# Patient Record
Sex: Male | Born: 1935 | Race: Black or African American | Hispanic: No | Smoking: Current every day smoker
Health system: Southern US, Community
[De-identification: ages and names within clinical notes are randomized; demographics above are authoritative.]

## PROBLEM LIST (undated history)

## (undated) DIAGNOSIS — R569 Unspecified convulsions: Secondary | ICD-10-CM

## (undated) DIAGNOSIS — F209 Schizophrenia, unspecified: Secondary | ICD-10-CM

## (undated) DIAGNOSIS — G459 Transient cerebral ischemic attack, unspecified: Secondary | ICD-10-CM

## (undated) DIAGNOSIS — I10 Essential (primary) hypertension: Secondary | ICD-10-CM

---

## 2014-05-17 ENCOUNTER — Emergency Department: Payer: Self-pay | Admitting: Emergency Medicine

## 2014-05-17 DIAGNOSIS — R569 Unspecified convulsions: Secondary | ICD-10-CM | POA: Diagnosis not present

## 2014-05-17 DIAGNOSIS — R197 Diarrhea, unspecified: Secondary | ICD-10-CM | POA: Diagnosis not present

## 2014-05-17 DIAGNOSIS — Z79899 Other long term (current) drug therapy: Secondary | ICD-10-CM | POA: Diagnosis not present

## 2014-05-17 DIAGNOSIS — R32 Unspecified urinary incontinence: Secondary | ICD-10-CM | POA: Diagnosis not present

## 2014-05-17 DIAGNOSIS — G40909 Epilepsy, unspecified, not intractable, without status epilepticus: Secondary | ICD-10-CM | POA: Diagnosis not present

## 2014-05-17 DIAGNOSIS — R7309 Other abnormal glucose: Secondary | ICD-10-CM | POA: Diagnosis not present

## 2014-07-25 ENCOUNTER — Encounter: Payer: Self-pay | Admitting: Emergency Medicine

## 2014-07-25 ENCOUNTER — Emergency Department: Payer: Medicare Other

## 2014-07-25 ENCOUNTER — Emergency Department
Admission: EM | Admit: 2014-07-25 | Discharge: 2014-07-25 | Disposition: A | Discharge status: Home / Self Care | Payer: Medicare Other | Attending: Emergency Medicine | Admitting: Emergency Medicine

## 2014-07-25 DIAGNOSIS — S199XXA Unspecified injury of neck, initial encounter: Secondary | ICD-10-CM | POA: Insufficient documentation

## 2014-07-25 DIAGNOSIS — Y9389 Activity, other specified: Secondary | ICD-10-CM | POA: Insufficient documentation

## 2014-07-25 DIAGNOSIS — R51 Headache: Secondary | ICD-10-CM | POA: Diagnosis not present

## 2014-07-25 DIAGNOSIS — S0003XA Contusion of scalp, initial encounter: Secondary | ICD-10-CM | POA: Insufficient documentation

## 2014-07-25 DIAGNOSIS — W1839XA Other fall on same level, initial encounter: Secondary | ICD-10-CM | POA: Diagnosis not present

## 2014-07-25 DIAGNOSIS — Z720 Tobacco use: Secondary | ICD-10-CM | POA: Insufficient documentation

## 2014-07-25 DIAGNOSIS — Y998 Other external cause status: Secondary | ICD-10-CM | POA: Insufficient documentation

## 2014-07-25 DIAGNOSIS — G40309 Generalized idiopathic epilepsy and epileptic syndromes, not intractable, without status epilepticus: Secondary | ICD-10-CM

## 2014-07-25 DIAGNOSIS — Y9289 Other specified places as the place of occurrence of the external cause: Secondary | ICD-10-CM | POA: Insufficient documentation

## 2014-07-25 DIAGNOSIS — M2578 Osteophyte, vertebrae: Secondary | ICD-10-CM | POA: Diagnosis not present

## 2014-07-25 DIAGNOSIS — S0093XA Contusion of unspecified part of head, initial encounter: Secondary | ICD-10-CM | POA: Diagnosis not present

## 2014-07-25 DIAGNOSIS — G40909 Epilepsy, unspecified, not intractable, without status epilepticus: Secondary | ICD-10-CM | POA: Diagnosis not present

## 2014-07-25 DIAGNOSIS — M542 Cervicalgia: Secondary | ICD-10-CM | POA: Diagnosis not present

## 2014-07-25 DIAGNOSIS — Z7409 Other reduced mobility: Secondary | ICD-10-CM | POA: Diagnosis not present

## 2014-07-25 DIAGNOSIS — R569 Unspecified convulsions: Secondary | ICD-10-CM | POA: Diagnosis not present

## 2014-07-25 DIAGNOSIS — F918 Other conduct disorders: Secondary | ICD-10-CM | POA: Diagnosis not present

## 2014-07-25 DIAGNOSIS — W19XXXA Unspecified fall, initial encounter: Secondary | ICD-10-CM | POA: Diagnosis not present

## 2014-07-25 HISTORY — DX: Schizophrenia, unspecified: F20.9

## 2014-07-25 HISTORY — DX: Unspecified convulsions: R56.9

## 2014-07-25 HISTORY — DX: Transient cerebral ischemic attack, unspecified: G45.9

## 2014-07-25 LAB — CBC
HEMATOCRIT: 42.7 % (ref 40.0–52.0)
HEMOGLOBIN: 13.9 g/dL (ref 13.0–18.0)
MCH: 26.6 pg (ref 26.0–34.0)
MCHC: 32.4 g/dL (ref 32.0–36.0)
MCV: 81.9 fL (ref 80.0–100.0)
PLATELETS: 174 10*3/uL (ref 150–440)
RBC: 5.22 MIL/uL (ref 4.40–5.90)
RDW: 14.6 % — ABNORMAL HIGH (ref 11.5–14.5)
WBC: 5.9 10*3/uL (ref 3.8–10.6)

## 2014-07-25 LAB — BASIC METABOLIC PANEL
Anion gap: 5 (ref 5–15)
BUN: 14 mg/dL (ref 6–20)
CO2: 33 mmol/L — ABNORMAL HIGH (ref 22–32)
CREATININE: 0.88 mg/dL (ref 0.61–1.24)
Calcium: 9.6 mg/dL (ref 8.9–10.3)
Chloride: 101 mmol/L (ref 101–111)
GLUCOSE: 89 mg/dL (ref 65–99)
POTASSIUM: 3.6 mmol/L (ref 3.5–5.1)
Sodium: 139 mmol/L (ref 135–145)

## 2014-07-25 LAB — GLUCOSE, CAPILLARY: GLUCOSE-CAPILLARY: 95 mg/dL (ref 65–99)

## 2014-07-25 MED ORDER — LEVETIRACETAM 500 MG PO TABS
ORAL_TABLET | ORAL | Status: AC
  Administered 2014-07-25: 1000 mg via ORAL
  Filled 2014-07-25: qty 2

## 2014-07-25 MED ORDER — LEVETIRACETAM 500 MG PO TABS
1000.0000 mg | ORAL_TABLET | Freq: Once | ORAL | Status: AC
  Administered 2014-07-25: 1000 mg via ORAL

## 2014-07-25 MED ORDER — LEVETIRACETAM 500 MG PO TABS
ORAL_TABLET | ORAL | Status: AC
  Filled 2014-07-25: qty 2

## 2014-07-25 NOTE — ED Notes (Signed)
Pt transported CT

## 2014-07-25 NOTE — Discharge Instructions (Signed)
Epilepsy  Please increase your Keppra dose to 1000 mg twice daily. [facility] as possible.  Return to the ER right away should you have a recurrent seizure, fever, chest pain, pass out, have trouble with speaking, weakness in one arm or leg or face, or other new concerns arise.  Epilepsy is a disorder in which a person has repeated seizures over time. A seizure is a release of abnormal electrical activity in the brain. Seizures can cause a change in attention, behavior, or the ability to remain awake and alert (altered mental status). Seizures often involve uncontrollable shaking (convulsions).  Most people with epilepsy lead normal lives. However, people with epilepsy are at an increased risk of falls, accidents, and injuries. Therefore, it is important to begin treatment right away. CAUSES  Epilepsy has many possible causes. Anything that disturbs the normal pattern of brain cell activity can lead to seizures. This may include:   Head injury.  Birth trauma.  High fever as a child.  Stroke.  Bleeding into or around the brain.  Certain drugs.  Prolonged low oxygen, such as what occurs after CPR efforts.  Abnormal brain development.  Certain illnesses, such as meningitis, encephalitis (brain infection), malaria, and other infections.  An imbalance of nerve signaling chemicals (neurotransmitters).  SIGNS AND SYMPTOMS  The symptoms of a seizure can vary greatly from one person to another. Right before a seizure, you may have a warning (aura) that a seizure is about to occur. An aura may include the following symptoms:  Fear or anxiety.  Nausea.  Feeling like the room is spinning (vertigo).  Vision changes, such as seeing flashing lights or spots. Common symptoms during a seizure include:  Abnormal sensations, such as an abnormal smell or a bitter taste in the mouth.   Sudden, general body stiffness.   Convulsions that involve  rhythmic jerking of the face, arm, or leg on one or both sides.   Sudden change in consciousness.   Appearing to be awake but not responding.   Appearing to be asleep but cannot be awakened.   Grimacing, chewing, lip smacking, drooling, tongue biting, or loss of bowel or bladder control. After a seizure, you may feel sleepy for a while. DIAGNOSIS  Your health care provider will ask about your symptoms and take a medical history. Descriptions from any witnesses to your seizures will be very helpful in the diagnosis. A physical exam, including a detailed neurological exam, is necessary. Various tests may be done, such as:   An electroencephalogram (EEG). This is a painless test of your brain waves. In this test, a diagram is created of your brain waves. These diagrams can be interpreted by a specialist.  An MRI of the brain.   A CT scan of the brain.   A spinal tap (lumbar puncture, LP).  Blood tests to check for signs of infection or abnormal blood chemistry. TREATMENT  There is no cure for epilepsy, but it is generally treatable. Once epilepsy is diagnosed, it is important to begin treatment as soon as possible. For most people with epilepsy, seizures can be controlled with medicines. The following may also be used:  A pacemaker for the brain (vagus nerve stimulator) can be used for people with seizures that are not well controlled by medicine.  Surgery on the brain. For some people, epilepsy eventually goes away. HOME CARE INSTRUCTIONS   Follow your health care provider's recommendations on driving and safety in  normal activities.  Get enough rest. Lack of sleep can cause seizures.  Only take over-the-counter or prescription medicines as directed by your health care provider. Take any prescribed medicine exactly as directed.  Avoid any known triggers of your seizures.  Keep a seizure diary. Record what you recall about any seizure, especially any possible trigger.    Make sure the people you live and work with know that you are prone to seizures. They should receive instructions on how to help you. In general, a witness to a seizure should:   Cushion your head and body.   Turn you on your side.   Avoid unnecessarily restraining you.   Not place anything inside your mouth.   Call for emergency medical help if there is any question about what has occurred.   Follow up with your health care provider as directed. You may need regular blood tests to monitor the levels of your medicine.  SEEK MEDICAL CARE IF:   You develop signs of infection or other illness. This might increase the risk of a seizure.   You seem to be having more frequent seizures.   Your seizure pattern is changing.  SEEK IMMEDIATE MEDICAL CARE IF:   You have a seizure that does not stop after a few moments.   You have a seizure that causes any difficulty in breathing.   You have a seizure that results in a very severe headache.   You have a seizure that leaves you with the inability to speak or use a part of your body.  Document Released: 02/28/2005 Document Revised: 12/19/2012 Document Reviewed: 10/10/2012 [facility] Patient Information 2015 [place], [place]. This information is not intended to replace advice given to you by your health care provider. Make sure you discuss any questions you have with your health care provider.

## 2014-07-25 NOTE — ED Provider Notes (Signed)
[facility] Emergency Department Provider Note  ____________________________________________  Time seen: Approximately 12:02 PM  I have reviewed the triage vital signs and the nursing notes.   HISTORY  Chief Complaint Seizures    HPI Timothy [name] is a 81 y.o. male who had a seizure today. The patient reports that he got up from bed, then recalls being awoken and told that he had a seizure.  EMS described that witnesses on scene describe a generalized seizure. The patient was initially confused with EMS so c-collar was applied as her unable to clear cervical spine. They did note that he has swelling over the right frontal area of his face which is new.  Blood sugar normal at EMS.  Patient denies any concerns at present. He denies headache. He stated his neck is slightly achy, but otherwise feels well. He states he is on seizure medication.  Patient notes ache in the neck. There is no numbness mild. It is noted as the upper portion of the neck. Timing is sudden onset after seizure. Quality described as achy.   Past Medical History  Diagnosis Date  . Seizures   . TIA (transient ischemic attack)   . Schizophrenia     There are no active problems to display for this patient.   History reviewed. No pertinent past surgical history.  No current outpatient prescriptions on file.  Allergies Review of patient's allergies indicates no known allergies.  History reviewed. No pertinent family history.  Social History History  Substance Use Topics  . Smoking status: Current Every Day Smoker -- 8.00 packs/day for 25 years    Types: Cigarettes  . Smokeless tobacco: Not on file  . Alcohol Use: No    Review of Systems Constitutional: No fever/chills Eyes: No visual changes. ENT: No sore throat. Achy neck. Cardiovascular: Denies chest pain. Respiratory: Denies shortness of breath. Gastrointestinal: No abdominal pain.  No nausea, no vomiting.  No  diarrhea.  No constipation. Genitourinary: Negative for dysuria. Musculoskeletal: Negative for back pain. Skin: Negative for rash. Neurological: Negative for headaches, focal weakness or numbness.  10-point ROS otherwise negative.  ____________________________________________   PHYSICAL EXAM:  VITAL SIGNS: ED Triage Vitals  Enc Vitals Group     BP 07/25/14 1132 162/100 mmHg     Pulse Rate 07/25/14 1132 75     Resp --      Temp 07/25/14 1132 98.2 F (36.8 C)     Temp Source 07/25/14 1132 Oral     SpO2 07/25/14 1132 97 %     Weight 07/25/14 1132 160 lb (72.576 kg)     Height 07/25/14 1132 5\' 11"  (1.803 m)     Head Cir --      Peak Flow --      Pain Score 07/25/14 1139 5     Pain Loc --      Pain Edu? --      Excl. in GC? --     Constitutional: Alert and oriented to self and place, but not to year.. Well appearing and in no acute distress. Eyes: Conjunctivae are normal. PERRL. EOMI. Head: Atraumatic except for an approximately 4 cm x 3 cm area of mild hematoma without skin lesion over the right frontal scalp. There is no ocular involvement. Nose: No congestion/rhinnorhea. Mouth/Throat: Mucous membranes are moist.  Oropharynx non-erythematous. Neck: No stridor.  Cervical tenderness to palpation. She is in a c-collar and this is maintained in the ER. Cardiovascular: Normal rate, regular rhythm. Grossly normal heart sounds.  Good peripheral circulation. Respiratory: Normal respiratory effort.  No retractions. Lungs CTAB. Gastrointestinal: Soft and nontender. No distention. No abdominal bruits. No CVA tenderness. Musculoskeletal: No lower extremity tenderness nor edema.  No joint effusions. Neurologic:  Normal speech and language. No gross focal neurologic deficits are appreciated. Speech is normal. No gait instability. Skin:  Skin is warm, dry and intact. No rash noted. Psychiatric: Mood is normal, however affect is quite flat. Speech and behavior are  normal  ____________________________________________   LABS (all labs ordered are listed, but only abnormal results are displayed)  Labs Reviewed  CBC - Abnormal; Notable for the following:    RDW 14.6 (*)    All other components within normal limits  BASIC METABOLIC PANEL - Abnormal; Notable for the following:    CO2 33 (*)    All other components within normal limits  GLUCOSE, CAPILLARY  CBG MONITORING, ED   ____________________________________________  EKG  Normal sinus rhythm with sinus arrhythmia. There is no acute ST abnormality. Ventricular rate 66 PR interval 200 QRS 110 QTc 410 ____________________________________________  RADIOLOGY  IMPRESSION: Head CT: Normal for age. No acute or traumatic finding other than a right frontal scalp hematoma.  Cervical spine CT: No acute or traumatic finding. Prominent bridging osteophytes consistent with diffuse idiopathic skeletal hyperostosis. ____________________________________________   PROCEDURES  Procedure(s) performed: None  Critical Care performed: No  ____________________________________________   INITIAL IMPRESSION / ASSESSMENT AND PLAN / ED COURSE  Pertinent labs & imaging results that were available during my care of the patient were reviewed by me and considered in my medical decision making (see chart for details).  Consistent with seizure. Patient has a known seizure disorder. He is currently awake and alert, and appears to be back to his baseline. The patient is currently residing in a group home. His Mars present and shows that he is compliant with his medications. Patient notably takes Keppra.  Because the patient does have evidence of injury to the frontal scalp and achiness in the neck, we will obtain CT imaging the head and cervical spine. In addition we will send basic labs including basic metabolic panel and CBC.  Patient labs reviewed. Only abnormality appears to be a CO 33.  CT head is  unremarkable for intracranial hemorrhage, noted frontal hematoma. C-spine no acute trauma.  Patient vital signs stable. He is awake and alert in no distress at this time.  Patient medication list includes Keppra 750 mg twice a day. Given an additional 1 g here. Paged neurology to discuss is a patient has had breakthrough seizure.  ----------------------------------------- 3:10 PM on 07/25/2014 ----------------------------------------- Patient is awake and alert. C-spine is cleared clinically at this time. Discussed with the patient seizure, and recommendations. He is comfortable with the plan. He will follow up with neurology as an outpatient, discussed with Dr. Katrinka BlazingSmith who recommends increasing patient's Keppra level to 1000 mg twice a day, who also states it is not necessary to repeat a level until follow-up with neurology in about 1 month.  I discussed Dr. Michaelle CopasSmith's recommendations. [facility]  Return precautions discussed.  ____________________________________________   FINAL CLINICAL IMPRESSION(S) / ED DIAGNOSES  Final diagnoses:  None   seizure, initial, acute history of seizure disorder    Sharyn CreamerMark Makaylynn Bonillas, MD 07/25/14 1600

## 2014-07-25 NOTE — ED Notes (Signed)
Seizure pads applied to side rails

## 2014-11-20 ENCOUNTER — Emergency Department
Admission: EM | Admit: 2014-11-20 | Discharge: 2014-11-21 | Disposition: A | Discharge status: Home / Self Care | Payer: Medicare Other | Attending: Emergency Medicine | Admitting: Emergency Medicine

## 2014-11-20 DIAGNOSIS — Z720 Tobacco use: Secondary | ICD-10-CM | POA: Diagnosis not present

## 2014-11-20 DIAGNOSIS — M25561 Pain in right knee: Secondary | ICD-10-CM | POA: Insufficient documentation

## 2014-11-20 DIAGNOSIS — Z79899 Other long term (current) drug therapy: Secondary | ICD-10-CM | POA: Diagnosis not present

## 2014-11-20 DIAGNOSIS — F918 Other conduct disorders: Secondary | ICD-10-CM | POA: Diagnosis not present

## 2014-11-20 DIAGNOSIS — F209 Schizophrenia, unspecified: Secondary | ICD-10-CM | POA: Insufficient documentation

## 2014-11-20 DIAGNOSIS — M25519 Pain in unspecified shoulder: Secondary | ICD-10-CM | POA: Diagnosis not present

## 2014-11-20 DIAGNOSIS — G40909 Epilepsy, unspecified, not intractable, without status epilepticus: Secondary | ICD-10-CM | POA: Diagnosis not present

## 2014-11-20 DIAGNOSIS — R4182 Altered mental status, unspecified: Secondary | ICD-10-CM | POA: Diagnosis present

## 2014-11-20 LAB — CBC WITH DIFFERENTIAL/PLATELET
BASOS ABS: 0 10*3/uL (ref 0–0.1)
BASOS PCT: 1 %
Eosinophils Absolute: 0.1 10*3/uL (ref 0–0.7)
Eosinophils Relative: 2 %
HEMATOCRIT: 45.7 % (ref 40.0–52.0)
HEMOGLOBIN: 14.9 g/dL (ref 13.0–18.0)
Lymphocytes Relative: 32 %
Lymphs Abs: 1.3 10*3/uL (ref 1.0–3.6)
MCH: 26.8 pg (ref 26.0–34.0)
MCHC: 32.7 g/dL (ref 32.0–36.0)
MCV: 81.9 fL (ref 80.0–100.0)
Monocytes Absolute: 0.5 10*3/uL (ref 0.2–1.0)
Monocytes Relative: 13 %
NEUTROS ABS: 2.2 10*3/uL (ref 1.4–6.5)
NEUTROS PCT: 52 %
Platelets: 155 10*3/uL (ref 150–440)
RBC: 5.58 MIL/uL (ref 4.40–5.90)
RDW: 14.3 % (ref 11.5–14.5)
WBC: 4.1 10*3/uL (ref 3.8–10.6)

## 2014-11-20 LAB — URINALYSIS COMPLETE WITH MICROSCOPIC (ARMC ONLY)
Bilirubin Urine: NEGATIVE
Glucose, UA: NEGATIVE mg/dL
HGB URINE DIPSTICK: NEGATIVE
KETONES UR: NEGATIVE mg/dL
LEUKOCYTES UA: NEGATIVE
NITRITE: NEGATIVE
PROTEIN: NEGATIVE mg/dL
SPECIFIC GRAVITY, URINE: 1.015 (ref 1.005–1.030)
pH: 6 (ref 5.0–8.0)

## 2014-11-20 LAB — COMPREHENSIVE METABOLIC PANEL WITH GFR
ALT: 19 U/L (ref 17–63)
AST: 30 U/L (ref 15–41)
Albumin: 4.2 g/dL (ref 3.5–5.0)
Alkaline Phosphatase: 85 U/L (ref 38–126)
Anion gap: 9 (ref 5–15)
BUN: 18 mg/dL (ref 6–20)
CO2: 30 mmol/L (ref 22–32)
Calcium: 9.6 mg/dL (ref 8.9–10.3)
Chloride: 105 mmol/L (ref 101–111)
Creatinine, Ser: 0.89 mg/dL (ref 0.61–1.24)
GFR calc Af Amer: >60 mL/min (ref >60)
GFR calc non Af Amer: >60 mL/min (ref >60)
Glucose, Bld: 111 mg/dL — ABNORMAL HIGH (ref 65–99)
Potassium: 3.9 mmol/L (ref 3.5–5.1)
Sodium: 144 mmol/L (ref 135–145)
Total Bilirubin: 0.5 mg/dL (ref 0.3–1.2)
Total Protein: 8.2 g/dL — ABNORMAL HIGH (ref 6.5–8.1)

## 2014-11-20 MED ORDER — LORAZEPAM 2 MG/ML IJ SOLN
0.5000 mg | Freq: Once | INTRAMUSCULAR | Status: AC
  Administered 2014-11-20: 0.5 mg via INTRAVENOUS
  Filled 2014-11-20: qty 1

## 2014-11-20 MED ORDER — LEVETIRACETAM 750 MG PO TABS
750.0000 mg | ORAL_TABLET | Freq: Once | ORAL | Status: AC
  Administered 2014-11-20: 750 mg via ORAL
  Filled 2014-11-20: qty 1

## 2014-11-20 MED ORDER — OLANZAPINE 10 MG PO TABS: 5.0000 mg | ORAL_TABLET | Freq: Once | ORAL | Status: DC

## 2014-11-20 MED ORDER — OLANZAPINE 10 MG PO TABS
10.0000 mg | ORAL_TABLET | Freq: Once | ORAL | Status: AC
  Administered 2014-11-20: 10 mg via ORAL
  Filled 2014-11-20: qty 1

## 2014-11-20 NOTE — ED Notes (Signed)

## 2014-11-20 NOTE — ED Notes (Signed)

## 2014-11-20 NOTE — ED Notes (Signed)
Spoke with Tim, Quarry manager of [facility], states they are unable to pick him up tonight but someone will be available in the morning to pick up patient. MD notified.

## 2014-11-20 NOTE — ED Notes (Signed)
Pt here by EMS from a group home with IVC paper. EMS ran on pt this AM for AMS, then pt refused to be transported. EMS ran again tonight after pt had IVC papers served on him pt was unable to walk to the police car and EMS was called. EMS states that pt appears to be having absent seizure. Pt has periods of AMS, letheray and then back to baseline. Pt does have a hx of seizure.  Pt was the custody of the [place] [employer].

## 2014-11-20 NOTE — Discharge Instructions (Signed)
Mr. Timothy [name] has done well in the emergency department. When he first arrived he was a little bit resistant but overall cooperative. He was treated with his regular medications, including his Keppra and Zyprexa. He has not had any seizure-like activity in the emergency department and he is calm and cooperative at this time without threat to self or others. The involuntary commitment has been released. Please follow-up with his regular doctor or psychiatrist. Return to the emergency department if there are any further urgent concerns.  Epilepsy People with epilepsy have times when they shake and jerk uncontrollably (seizures). This happens when there is a sudden change in brain function. Epilepsy may have many possible causes. Anything that disturbs the normal pattern of brain cell activity can lead to seizures. HOME CARE   Follow your doctor's instructions about driving and safety during normal activities.  Get enough sleep.  Only take medicine as told by your doctor.  Avoid things that you know can cause you to have seizures (triggers).  Write down when your seizures happen and what you remember about each seizure. Write down anything you think may have caused the seizure to happen.  Tell the people you live and work with that you have seizures. Make sure they know how to help you. They should:  Cushion your head and body.  Turn you on your side.  Not restrain you.  Not place anything inside your mouth.  Call for local emergency medical help if there is any question about what has happened.  Keep all follow-up visits with your doctor. This is very important. GET HELP IF:  You get an infection or start to feel sick. You may have more seizures when you are sick.  You are having seizures more often.  Your seizure pattern is changing. GET HELP RIGHT AWAY IF:   A seizure does not stop after a few seconds or minutes.  A seizure causes you to have trouble breathing.  A seizure gives  you a very bad headache.  A seizure makes you unable to speak or use a part of your body. Document Released: 12/26/2008 Document Revised: 12/19/2012 Document Reviewed: 10/10/2012 [facility] Patient Information 2015 [place], [place]. This information is not intended to replace advice given to you by your health care provider. Make sure you discuss any questions you have with your health care provider.

## 2014-11-20 NOTE — ED Notes (Signed)
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
## 2014-11-21 NOTE — ED Notes (Signed)

## 2014-11-21 NOTE — ED Notes (Signed)
BEHAVIORAL HEALTH ROUNDING Patient sleeping: Yes.   Patient alert and oriented: yes Behavior appropriate: Yes.  ;  Nutrition and fluids offered: Yes  Toileting and hygiene offered: Yes  Sitter present: yes Law enforcement present: Yes   ENVIRONMENTAL ASSESSMENT Potentially harmful objects out of patient reach: Yes.   Personal belongings secured: Yes.   Patient dressed in hospital provided attire only: Yes.   Plastic bags out of patient reach: Yes.   Patient care equipment (cords, cables, call bells, lines, and drains) shortened, removed, or accounted for: Yes.   Equipment and supplies removed from bottom of stretcher: Yes.   Potentially toxic materials out of patient reach: Yes.   Sharps container removed or out of patient reach: Yes.     

## 2014-11-21 NOTE — ED Notes (Signed)
Pt offered breakfast tray, pt drank juice but did not want to eat

## 2014-11-28 ENCOUNTER — Encounter: Payer: Self-pay | Admitting: Emergency Medicine

## 2014-11-28 ENCOUNTER — Emergency Department
Admission: EM | Admit: 2014-11-28 | Discharge: 2014-12-05 | Disposition: A | Discharge status: Home / Self Care | Payer: Medicare Other | Attending: Emergency Medicine | Admitting: Emergency Medicine

## 2014-11-28 DIAGNOSIS — F29 Unspecified psychosis not due to a substance or known physiological condition: Secondary | ICD-10-CM | POA: Insufficient documentation

## 2014-11-28 DIAGNOSIS — Z046 Encounter for general psychiatric examination, requested by authority: Secondary | ICD-10-CM | POA: Diagnosis present

## 2014-11-28 DIAGNOSIS — Z720 Tobacco use: Secondary | ICD-10-CM | POA: Insufficient documentation

## 2014-11-28 DIAGNOSIS — F0390 Unspecified dementia without behavioral disturbance: Secondary | ICD-10-CM

## 2014-11-28 DIAGNOSIS — F0391 Unspecified dementia with behavioral disturbance: Secondary | ICD-10-CM

## 2014-11-28 DIAGNOSIS — R569 Unspecified convulsions: Secondary | ICD-10-CM

## 2014-11-28 DIAGNOSIS — I10 Essential (primary) hypertension: Secondary | ICD-10-CM

## 2014-11-28 DIAGNOSIS — F209 Schizophrenia, unspecified: Secondary | ICD-10-CM | POA: Diagnosis not present

## 2014-11-28 DIAGNOSIS — F203 Undifferentiated schizophrenia: Secondary | ICD-10-CM | POA: Diagnosis not present

## 2014-11-28 DIAGNOSIS — G459 Transient cerebral ischemic attack, unspecified: Secondary | ICD-10-CM

## 2014-11-28 DIAGNOSIS — E785 Hyperlipidemia, unspecified: Secondary | ICD-10-CM

## 2014-11-28 DIAGNOSIS — F03918 Unspecified dementia, unspecified severity, with other behavioral disturbance: Secondary | ICD-10-CM

## 2014-11-28 LAB — CBC
HCT: 43.9 % (ref 40.0–52.0)
Hemoglobin: 14.4 g/dL (ref 13.0–18.0)
MCH: 26.7 pg (ref 26.0–34.0)
MCHC: 32.8 g/dL (ref 32.0–36.0)
MCV: 81.4 fL (ref 80.0–100.0)
PLATELETS: 181 10*3/uL (ref 150–440)
RBC: 5.39 MIL/uL (ref 4.40–5.90)
RDW: 14.4 % (ref 11.5–14.5)
WBC: 5.2 10*3/uL (ref 3.8–10.6)

## 2014-11-28 LAB — COMPREHENSIVE METABOLIC PANEL
ALT: 17 U/L (ref 17–63)
AST: 30 U/L (ref 15–41)
Albumin: 4.5 g/dL (ref 3.5–5.0)
Alkaline Phosphatase: 89 U/L (ref 38–126)
Anion gap: 10 (ref 5–15)
BILIRUBIN TOTAL: 0.6 mg/dL (ref 0.3–1.2)
BUN: 13 mg/dL (ref 6–20)
CHLORIDE: 106 mmol/L (ref 101–111)
CO2: 28 mmol/L (ref 22–32)
CREATININE: 0.99 mg/dL (ref 0.61–1.24)
Calcium: 9.7 mg/dL (ref 8.9–10.3)
Glucose, Bld: 97 mg/dL (ref 65–99)
POTASSIUM: 3.7 mmol/L (ref 3.5–5.1)
Sodium: 144 mmol/L (ref 135–145)
TOTAL PROTEIN: 8.3 g/dL — AB (ref 6.5–8.1)

## 2014-11-28 LAB — URINE DRUG SCREEN, QUALITATIVE (ARMC ONLY)
Amphetamines, Ur Screen: NOT DETECTED
BENZODIAZEPINE, UR SCRN: NOT DETECTED
Barbiturates, Ur Screen: NOT DETECTED
CANNABINOID 50 NG, UR ~~LOC~~: NOT DETECTED
Cocaine Metabolite,Ur ~~LOC~~: NOT DETECTED
MDMA (Ecstasy)Ur Screen: NOT DETECTED
Methadone Scn, Ur: NOT DETECTED
Opiate, Ur Screen: NOT DETECTED
PHENCYCLIDINE (PCP) UR S: NOT DETECTED
TRICYCLIC, UR SCREEN: NOT DETECTED

## 2014-11-28 LAB — ACETAMINOPHEN LEVEL: Acetaminophen (Tylenol), Serum: <10 ug/mL — ABNORMAL LOW (ref 10–30)

## 2014-11-28 LAB — ETHANOL

## 2014-11-28 LAB — SALICYLATE LEVEL

## 2014-11-28 MED ORDER — ATORVASTATIN CALCIUM 20 MG PO TABS
10.0000 mg | ORAL_TABLET | Freq: Every day | ORAL | Status: DC
  Administered 2014-11-28 – 2014-12-03 (×5): 10 mg via ORAL
  Filled 2014-11-28 (×4): qty 1
  Filled 2014-11-28: qty 2
  Filled 2014-11-28 (×3): qty 1

## 2014-11-28 MED ORDER — ZIPRASIDONE MESYLATE 20 MG IM SOLR
20.0000 mg | Freq: Once | INTRAMUSCULAR | Status: AC
  Administered 2014-11-28: 20 mg via INTRAMUSCULAR
  Filled 2014-11-28: qty 20

## 2014-11-28 MED ORDER — LORAZEPAM 2 MG/ML IJ SOLN
2.0000 mg | Freq: Once | INTRAMUSCULAR | Status: AC
  Administered 2014-11-28: 2 mg via INTRAMUSCULAR
  Filled 2014-11-28: qty 1

## 2014-11-28 MED ORDER — LAMOTRIGINE 25 MG PO TABS
50.0000 mg | ORAL_TABLET | Freq: Two times a day (BID) | ORAL | Status: DC
  Administered 2014-11-29 – 2014-12-04 (×10): 50 mg via ORAL
  Filled 2014-11-28 (×17): qty 2

## 2014-11-28 MED ORDER — OLANZAPINE 10 MG PO TABS
10.0000 mg | ORAL_TABLET | Freq: Every day | ORAL | Status: DC
  Administered 2014-11-29 – 2014-12-02 (×4): 10 mg via ORAL
  Filled 2014-11-28 (×5): qty 1

## 2014-11-28 MED ORDER — VITAMIN B-12 1000 MCG PO TABS
1000.0000 ug | ORAL_TABLET | Freq: Every day | ORAL | Status: DC
  Administered 2014-11-28 – 2014-12-04 (×6): 1000 ug via ORAL
  Filled 2014-11-28 (×8): qty 1

## 2014-11-28 MED ORDER — HALOPERIDOL LACTATE 5 MG/ML IJ SOLN
5.0000 mg | Freq: Once | INTRAMUSCULAR | Status: AC
  Administered 2014-11-28: 5 mg via INTRAMUSCULAR
  Filled 2014-11-28: qty 1

## 2014-11-28 MED ORDER — AMLODIPINE BESYLATE 5 MG PO TABS
10.0000 mg | ORAL_TABLET | Freq: Every day | ORAL | Status: DC
  Administered 2014-11-28 – 2014-12-04 (×6): 10 mg via ORAL
  Filled 2014-11-28 (×7): qty 2

## 2014-11-28 MED ORDER — LEVETIRACETAM 500 MG PO TABS
1500.0000 mg | ORAL_TABLET | Freq: Two times a day (BID) | ORAL | Status: DC
Start: 2014-11-28 — End: 2014-12-05
  Administered 2014-11-29 – 2014-12-04 (×10): 1500 mg via ORAL
  Filled 2014-11-28 (×12): qty 3

## 2014-11-28 NOTE — ED Notes (Signed)
ED BHU PLACEMENT JUSTIFICATION Is the patient under IVC or is there intent for IVC: Yes.   Is the patient medically cleared: Yes.   Is there vacancy in the ED BHU: Yes.   Is the population mix appropriate for patient: No. Is the patient awaiting placement in inpatient or outpatient setting: Yes.   Has the patient had a psychiatric consult: Yes.   Survey of unit performed for contraband, proper placement and condition of furniture, tampering with fixtures in bathroom, shower, and each patient room: Yes.  ; Findings:  APPEARANCE/BEHAVIOR calm and uncooperative NEURO ASSESSMENT Orientation: person Hallucinations: No.None noted (Hallucinations) Speech: Normal Gait: normal RESPIRATORY ASSESSMENT Normal expansion.  Clear to auscultation.  No rales, rhonchi, or wheezing. CARDIOVASCULAR ASSESSMENT regular rate and rhythm, S1, S2 normal, no murmur, click, rub or gallop GASTROINTESTINAL ASSESSMENT soft, nontender, BS WNL, no r/g EXTREMITIES normal strength, tone, and muscle mass, no deformities, no erythema, induration, or nodules PLAN OF CARE Provide calm/safe environment. Vital signs assessed twice daily. ED BHU Assessment once each 12-hour shift. Collaborate with intake RN daily or as condition indicates. Assure the ED Damarius Karnes has rounded once each shift. Provide and encourage hygiene. Provide redirection as needed. Assess for escalating behavior; address immediately and inform ED Naveah Brave.  Assess family dynamic and appropriateness for visitation as needed: Yes.  ; If necessary, describe findings:  Educate the patient/family about BHU procedures/visitation: Yes.  ; If necessary, describe findings:   ENVIRONMENTAL ASSESSMENT Potentially harmful objects out of patient reach: Yes.   Personal belongings secured: Yes.   Patient dressed in hospital provided attire only: Yes.   Plastic bags out of patient reach: Yes.   Patient care equipment (cords, cables, call bells, lines, and drains)  shortened, removed, or accounted for: Yes.   Equipment and supplies removed from bottom of stretcher: Yes.   Potentially toxic materials out of patient reach: Yes.   Sharps container removed or out of patient reach: Yes.     BEHAVIORAL HEALTH ROUNDING Patient sleeping: No. Patient alert and oriented: not applicable, pt oriented to self only Behavior appropriate: Yes.  ; If no, describe:  Nutrition and fluids offered: Yes  Toileting and hygiene offered: Yes  Sitter present: q37min rounding Law enforcement present: Yes

## 2014-11-28 NOTE — ED Notes (Signed)

## 2014-11-28 NOTE — ED Notes (Signed)
BEHAVIORAL HEALTH ROUNDING Patient sleeping: Yes.   Patient alert and oriented: sleeping Behavior appropriate: Yes.  ; If no, describe:  Nutrition and fluids offered: Yes  Toileting and hygiene offered: Yes  Sitter present: yes Law enforcement present: Yes  
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
## 2014-11-28 NOTE — ED Notes (Signed)
Pt is cooperative at this time.  Wants to go home. Explained to patient that he cannot go home until he is cleared by psychiatry.
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
## 2014-11-28 NOTE — ED Notes (Signed)
,  BEHAVIORAL HEALTH ROUNDING Patient sleeping: Yes.   Patient alert and oriented: sleeping Behavior appropriate: Yes.  ; If no, describe:  Nutrition and fluids offered: Yes  Toileting and hygiene offered: Yes  Sitter present: yes Law enforcement present: Yes  

## 2014-11-28 NOTE — ED Notes (Signed)
Patient dressed out in presence of [employer], [employer] Technical sales engineer and Nurse Tech.  While in bathroom patient sat down on the floor. Before he would allow staff to assist him with changing.  [employer]DS also obtained at this time.

## 2014-11-28 NOTE — ED Notes (Signed)
BEHAVIORAL HEALTH ROUNDING Patient sleeping: Yes.   Patient alert and oriented: no Behavior appropriate: Yes.  ; If no, describe:  Nutrition and fluids offered: No Toileting and hygiene offered: No Sitter present: q32min rounding Law enforcement present: Yes
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
## 2014-11-28 NOTE — ED Notes (Signed)
Patient now resting in bed with eyes closed.  NAD. Scientist, research (life sciences) nearby.

## 2014-11-28 NOTE — ED Notes (Signed)
BEHAVIORAL HEALTH ROUNDING Patient sleeping: No. Patient alert and oriented: No. Does not know situation or time Behavior appropriate: Yes.  ; If no, describe: Nutrition and fluids offered: Yes  Toileting and hygiene offered: Yes  Sitter present: yes Law enforcement present: Yes
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
## 2014-11-28 NOTE — ED Notes (Signed)
BEHAVIORAL HEALTH ROUNDING Patient sleeping: Yes.   Patient alert and oriented: sleeping Behavior appropriate: Yes.  ; If no, describe:  Nutrition and fluids offered: sleeping Toileting and hygiene offered: sleeping Sitter present: yes Law enforcement present: Yes  

## 2014-11-28 NOTE — ED Notes (Signed)
BEHAVIORAL HEALTH ROUNDING Patient sleeping: No. Patient alert and oriented: no Behavior appropriate: No.; If no, describe: patient yelling loudly and clapping loudly non-stop Nutrition and fluids offered: Yes  Toileting and hygiene offered: Yes  Sitter present: yes Law enforcement present: Yes
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
## 2014-11-29 DIAGNOSIS — F209 Schizophrenia, unspecified: Secondary | ICD-10-CM | POA: Diagnosis not present

## 2014-11-29 MED ORDER — LORAZEPAM 2 MG/ML IJ SOLN
2.0000 mg | Freq: Once | INTRAMUSCULAR | Status: AC
  Administered 2014-11-29: 2 mg via INTRAVENOUS
  Filled 2014-11-29: qty 1

## 2014-11-29 MED ORDER — ZIPRASIDONE MESYLATE 20 MG IM SOLR
20.0000 mg | Freq: Once | INTRAMUSCULAR | Status: AC
  Administered 2014-11-29: 20 mg via INTRAMUSCULAR
  Filled 2014-11-29: qty 20

## 2014-11-29 MED ORDER — DIPHENHYDRAMINE HCL 50 MG/ML IJ SOLN
25.0000 mg | Freq: Once | INTRAMUSCULAR | Status: AC
  Administered 2014-11-29: 25 mg via INTRAVENOUS
  Filled 2014-11-29: qty 1

## 2014-11-29 NOTE — ED Notes (Signed)
BEHAVIORAL HEALTH ROUNDING Patient sleeping: Yes.   Patient alert and oriented: not applicable Behavior appropriate: Yes.  ; If no, describe:  Nutrition and fluids offered: No Toileting and hygiene offered: No Sitter present: q15min rounding Law enforcement present: Yes   

## 2014-11-29 NOTE — ED Notes (Signed)
BEHAVIORAL HEALTH ROUNDING Patient sleeping: No. Patient alert and oriented: no Behavior appropriate: Yes.  ; If no, describe:  Nutrition and fluids offered: Yes  Toileting and hygiene offered: Yes  Sitter present: no Law enforcement present: Yes  

## 2014-11-29 NOTE — Progress Notes (Signed)
[facility] [place]- Contacted: no beds available  [facility]([phone])- Contacted: there are no beds available   [facility] [place] [phone]- Contacted, beds may be available atfer 2pm. This writer faxed packet and referral information to [phone] @ [phone]. -  This writer followed up, they are currently at capacity and requested that we follow up on Monday 12/01/14.   Referral information for Geriatric Placement have been faxed to;   [facility]([phone])- Beds available, This Clinical research associate faxed referral  packet to [facility] -[phone] @ [phone]  Cheryl Flash, MS,NCC,LPCA

## 2014-11-29 NOTE — ED Notes (Signed)
Pt refuses to take lipitor at this time states, "I will take it at 8"

## 2014-11-29 NOTE — ED Notes (Signed)
BEHAVIORAL HEALTH ROUNDING Patient sleeping: No. Patient alert and oriented: yes Behavior appropriate: Yes.  ; If no, describe:  Nutrition and fluids offered: Yes  Toileting and hygiene offered: Yes  Sitter present: yes Law enforcement present: Yes  

## 2014-11-29 NOTE — ED Notes (Signed)
BEHAVIORAL HEALTH ROUNDING Patient sleeping: Yes.   Patient alert and oriented: not applicable Behavior appropriate: Yes.  ; If no, describe: sleeping Nutrition and fluids offered: No Toileting and hygiene offered: No Sitter present: yes Law enforcement present: Yes   ENVIRONMENTAL ASSESSMENT Potentially harmful objects out of patient reach: Yes.   Personal belongings secured: Yes.   Patient dressed in hospital provided attire only: Yes.   Plastic bags out of patient reach: Yes.   Patient care equipment (cords, cables, call bells, lines, and drains) shortened, removed, or accounted for: Yes.   Equipment and supplies removed from bottom of stretcher: Yes.   Potentially toxic materials out of patient reach: Yes.   Sharps container removed or out of patient reach: Yes.    

## 2014-11-29 NOTE — ED Notes (Signed)
BEHAVIORAL HEALTH ROUNDING Patient sleeping: No. Patient alert and oriented: yes Behavior appropriate: Yes.  ;  Nutrition and fluids offered: Yes  Toileting and hygiene offered: Yes  Sitter present: yes Law enforcement present: Yes  

## 2014-11-29 NOTE — ED Notes (Signed)
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
## 2014-11-29 NOTE — ED Notes (Signed)
Pt declines offer for restroom. Pt moving all extremities, denies pain, alert to self , unsure of day, pt alert to place(states "i'm in the hospital".) pt repositioned in bed for comfort.

## 2014-11-29 NOTE — ED Notes (Signed)
Pt continues to move around in bed, refuses po fluids or snack.

## 2014-11-29 NOTE — ED Notes (Signed)
BEHAVIORAL HEALTH ROUNDING Patient sleeping: Yes.   Patient alert and oriented: no Behavior appropriate: Yes.  ; If no, describe:  Nutrition and fluids offered: No Toileting and hygiene offered: No Sitter present: q15min rounding Law enforcement present: Yes   

## 2014-11-29 NOTE — ED Notes (Signed)
Pt trying to escape room, climb out of bed, MD Derrill Kay made aware. Orders placed.

## 2014-11-29 NOTE — ED Notes (Signed)
Pt states will take his zocor with his "medicine at night".

## 2014-11-29 NOTE — ED Notes (Signed)
Per Dr. Scotty Court, hold keppra and lamictal but give zyprexa now that pt is awake.

## 2014-11-29 NOTE — ED Notes (Signed)
BEHAVIORAL HEALTH ROUNDING Patient sleeping: Yes.   Patient alert and oriented: no, pt sleeping Behavior appropriate: Yes.  ; If no, describe:  Nutrition and fluids offered: No, pt sleeping Toileting and hygiene offered: No, pt sleeping Sitter present: no Law enforcement present: Yes

## 2014-11-29 NOTE — ED Notes (Signed)
Report from lauryn, rn. Pt is currently in bed moving all extremities, refusing zocor at this time. Will continue to try and administer zocor. Pt is currently alert to self only. Pt watching tv, skin normal color warm and dry, resps unlabored.

## 2014-11-29 NOTE — ED Notes (Signed)
BEHAVIORAL HEALTH ROUNDING  Patient sleeping: No.  Patient alert and oriented: A/O x2 Behavior appropriate: Yes. ; If no, describe: Nutrition and fluids offered: yes Toileting and hygiene offered: Yes, bath given Sitter present: yes  Law enforcement present: Yes  ENVIRONMENTAL ASSESSMENT  Potentially harmful objects out of patient reach: Yes.  Personal belongings secured: Yes.  Patient dressed in hospital provided attire only: Yes.  Plastic bags out of patient reach: Yes.  Patient care equipment (cords, cables, call bells, lines, and drains) shortened, removed, or accounted for: Yes.  Equipment and supplies removed from bottom of stretcher: Yes.  Potentially toxic materials out of patient reach: Yes.  Sharps container removed or out of patient reach: Yes.

## 2014-11-30 DIAGNOSIS — F209 Schizophrenia, unspecified: Secondary | ICD-10-CM | POA: Diagnosis not present

## 2014-11-30 LAB — GLUCOSE, CAPILLARY
Glucose-Capillary: 117 mg/dL — ABNORMAL HIGH (ref 65–99)
Glucose-Capillary: 45 mg/dL — ABNORMAL LOW (ref 65–99)

## 2014-11-30 MED ORDER — GLUCERNA SHAKE PO LIQD
237.0000 mL | Freq: Three times a day (TID) | ORAL | Status: DC
  Administered 2014-11-30 – 2014-12-04 (×6): 237 mL via ORAL

## 2014-11-30 MED ORDER — DEXTROSE 50 % IV SOLN
25.0000 g | Freq: Once | INTRAVENOUS | Status: AC
  Administered 2014-11-30: 25 g via INTRAVENOUS

## 2014-11-30 MED ORDER — DEXTROSE 50 % IV SOLN
INTRAVENOUS | Status: AC
  Administered 2014-11-30: 25 g via INTRAVENOUS
  Filled 2014-11-30: qty 50

## 2014-11-30 MED ORDER — LORAZEPAM 1 MG PO TABS
1.0000 mg | ORAL_TABLET | Freq: Four times a day (QID) | ORAL | Status: DC | PRN
  Administered 2014-12-02 – 2014-12-04 (×2): 1 mg via ORAL
  Filled 2014-11-30 (×3): qty 1

## 2014-11-30 MED ORDER — SODIUM CHLORIDE 0.9 % IV SOLN
Freq: Once | INTRAVENOUS | Status: AC
  Administered 2014-11-30: 17:00:00 via INTRAVENOUS
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
## 2014-11-30 NOTE — ED Notes (Signed)
Pt given additional warm blankets for comfort, lights dimmed, sitter at doorway assisting pt as needed and redirecting when attempting to get up, cont to monitor

## 2014-11-30 NOTE — ED Notes (Signed)
Pt assisted with urinal by CNA. Pt back to bed, pt moving all extremities without difficulty.

## 2014-11-30 NOTE — ED Notes (Signed)
BEHAVIORAL HEALTH ROUNDING Patient sleeping: No. Patient alert and oriented: no Behavior appropriate: Yes.  ; If no, describe:  Nutrition and fluids offered: Yes  Toileting and hygiene offered: Yes  Sitter present: no Law enforcement present: Yes  

## 2014-11-30 NOTE — ED Notes (Signed)
Pt awake, moving all extremities, denies need to use restroom. Pt declines offer for po fluids. Pt repositioned in bed for comfort. Skin normal color, warm and dry. resps unlabored.

## 2014-11-30 NOTE — ED Provider Notes (Signed)
-----------------------------------------   8:46 AM on 11/30/2014 -----------------------------------------  I have been asked by the emergency department nurse to review the patient's situation and consider a when necessary medication for him if the patient he comes agitated.  I have reviewed his recent orders, including the need for IM Geodon on the morning of the 16th, the afternoon of the 16th, and on the afternoon of the 17th  The patient has also received Ativan and Benadryl IM later on the 17th.  The patient is currently on multiple medications to help with schizophrenia and agitation. I will add a when necessary order for Ativan, 1 mg by mouth, to be used in case of mild agitation.     Darien Ramus, MD 11/30/14 [phone]

## 2014-11-30 NOTE — ED Notes (Signed)
BEHAVIORAL HEALTH ROUNDING Patient sleeping: Yes.   Patient alert and oriented: pt sleeping Behavior appropriate: Yes.  ; If no, describe:  Nutrition and fluids offered: pt sleeping Toileting and hygiene offered: pt sleeping Sitter present: no Law enforcement present: Yes  

## 2014-11-30 NOTE — ED Notes (Signed)
Pt not easy to awaken - mouth extremely dry, skin tenting. Spoke with dr Mayford Knife and he ordered iv and fluids.
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
## 2014-11-30 NOTE — ED Notes (Signed)
BEHAVIORAL HEALTH ROUNDING Patient sleeping: No. Patient alert and oriented: yes Behavior appropriate: Yes.  ; If no, describe:  Nutrition and fluids offered: Yes  Toileting and hygiene offered: Yes  Sitter present: yes Law enforcement present: Yes  

## 2014-11-30 NOTE — ED Notes (Signed)
Pt awake, assisted with urinal and placed back in bed. Pt provided with po fluids on request. Pt remains alert to self only. Pt moving all extremities without difficulty. Skin normal color warm and dry. resps unlabored. Pt continues to deny pain.

## 2014-11-30 NOTE — ED Notes (Signed)
BEHAVIORAL HEALTH ROUNDING Patient sleeping: Yes.   Patient alert and oriented: not applicable Behavior appropriate: Yes.  ; If no, describe:  Nutrition and fluids offered: Yes  Toileting and hygiene offered: Yes  Sitter present: yes Law enforcement present: Yes  

## 2014-11-30 NOTE — BHH Counselor (Signed)
[facility] [place]- on Diversion per Vernona Rieger [phone])  Anne Shutter, LPCA Therapeutic Triage Specialist

## 2014-11-30 NOTE — ED Notes (Signed)
Pt more alert and urinated in a urinal for the tech.

## 2014-11-30 NOTE — ED Notes (Signed)
Pt not eating well, did not eat breakfast and only ate a hand full of french fries for lunch. Per staff he did not eat well yesterday either. Will make doctor aware.

## 2014-11-30 NOTE — ED Notes (Signed)
BEHAVIORAL HEALTH ROUNDING Patient sleeping: Yes.   Patient alert and oriented: not applicable Behavior appropriate: Yes.  ; If no, describe:  Nutrition and fluids offered: no  Toileting and hygiene offered: no Sitter present: no Law enforcement present: Yes

## 2014-11-30 NOTE — ED Notes (Signed)
BEHAVIORAL HEALTH ROUNDING Patient sleeping: Yes.   Patient alert and oriented: not applicable Behavior appropriate: Yes.  ; If no, describe:  Nutrition and fluids offered: No Toileting and hygiene offered: No Sitter present: yes Law enforcement present: Yes   

## 2014-11-30 NOTE — ED Provider Notes (Signed)
-----------------------------------------   6:03 AM on 11/30/2014 -----------------------------------------   Blood pressure 117/64, pulse 88, temperature 98.6 F (37 C), temperature source Oral, resp. rate 12, height  (1.778 m), weight 157 lb (71.215 kg), SpO2 96 %.  The patient had no acute events since last update.  Calm and cooperative at this time.  Disposition is pending per Psychiatry/Behavioral Medicine team recommendations.     Sharyn Creamer, MD 11/30/14 [phone]

## 2014-11-30 NOTE — ED Notes (Signed)

## 2014-11-30 NOTE — ED Notes (Signed)
BEHAVIORAL HEALTH ROUNDING Patient sleeping: Yes.   Patient alert and oriented: not applicable Behavior appropriate: Yes.  ; If no, describe:  Nutrition and fluids offered: No Toileting and hygiene offered: No Sitter present: no Law enforcement present: Yes  

## 2014-11-30 NOTE — Progress Notes (Signed)
Call to [facility], no beds.   Call to [facility], no beds.  Call to [facility], CSW faxed referral for available psych beds.  [name]. [name], MSW Clinical Social Work Department Emergency Room [phone] 4:27 PM

## 2014-11-30 NOTE — ED Notes (Signed)
CBG after dextrose was 117.

## 2014-11-30 NOTE — ED Notes (Signed)
Report to shannin, rn.  

## 2014-11-30 NOTE — ED Notes (Signed)
Had the medic check the pt's blood sugar - cbg was 45. Dr made aware and pt given dextrose. Moved to room 24 and placed on monitor for better observation.

## 2014-11-30 NOTE — ED Notes (Signed)
Pt IVC was seen by Dr Toni Amend. Pt pending placement when medically cleared.

## 2014-11-30 NOTE — ED Notes (Signed)
ED BHU PLACEMENT JUSTIFICATION Is the patient under IVC or is there intent for IVC: Yes.   Is the patient medically cleared: Yes.   Is there vacancy in the ED BHU: No. Is the population mix appropriate for patient: No. Is the patient awaiting placement in inpatient or outpatient setting: Yes.   Has the patient had a psychiatric consult: Yes.   Survey of unit performed for contraband, proper placement and condition of furniture, tampering with fixtures in bathroom, shower, and each patient room: Yes.  ; Findings: clear APPEARANCE/BEHAVIOR calm NEURO ASSESSMENT Orientation: place and person Hallucinations: No.None noted (Hallucinations) Speech: Garbled Gait: unsteady RESPIRATORY ASSESSMENT Normal expansion.  Clear to auscultation.  No rales, rhonchi, or wheezing. CARDIOVASCULAR ASSESSMENT regular rate and rhythm, S1, S2 normal, no murmur, click, rub or gallop GASTROINTESTINAL ASSESSMENT soft, nontender, BS WNL, no r/g EXTREMITIES no evidence of joint instability PLAN OF CARE Provide calm/safe environment. Vital signs assessed twice daily. ED BHU Assessment once each 12-hour shift. Collaborate with intake RN daily or as condition indicates. Assure the ED provider has rounded once each shift. Provide and encourage hygiene. Provide redirection as needed. Assess for escalating behavior; address immediately and inform ED provider.  Assess family dynamic and appropriateness for visitation as needed: No.; If necessary, describe findings: no family available to assess Educate the patient/family about BHU procedures/visitation: No.; If necessary, describe findings: no family available to assess

## 2014-11-30 NOTE — ED Notes (Signed)
BEHAVIORAL HEALTH ROUNDING Patient sleeping: yes Patient alert and oriented: no Behavior appropriate: No.; If no, describe: pt dehydrated and sugar dropped from refusing to eat and drink for the last 2-3 days Nutrition and fluids offered: Yes  Toileting and hygiene offered: Yes  Sitter present: yes Law enforcement present: Yes

## 2014-11-30 NOTE — ED Notes (Signed)
Pt pulled his iv out.   

## 2014-11-30 NOTE — ED Notes (Signed)
BEHAVIORAL HEALTH ROUNDING Patient sleeping: No. Patient alert and oriented: yes Behavior appropriate: No.; If no, describe: trying to climb out of bed Nutrition and fluids offered: Yes  Toileting and hygiene offered: Yes  Sitter present: yes Law enforcement present: Yes

## 2014-12-01 DIAGNOSIS — F209 Schizophrenia, unspecified: Secondary | ICD-10-CM | POA: Diagnosis not present

## 2014-12-01 LAB — GLUCOSE, CAPILLARY
GLUCOSE-CAPILLARY: 77 mg/dL (ref 65–99)
GLUCOSE-CAPILLARY: 114 mg/dL — AB (ref 65–99)
GLUCOSE-CAPILLARY: 109 mg/dL — AB (ref 65–99)
GLUCOSE-CAPILLARY: 117 mg/dL — AB (ref 65–99)
Glucose-Capillary: 55 mg/dL — ABNORMAL LOW (ref 65–99)
Glucose-Capillary: 101 mg/dL — ABNORMAL HIGH (ref 65–99)

## 2014-12-01 MED ORDER — ZIPRASIDONE MESYLATE 20 MG IM SOLR
INTRAMUSCULAR | Status: AC
Start: 2014-12-01 — End: 2014-12-01
  Administered 2014-12-01: 20 mg via INTRAMUSCULAR
  Filled 2014-12-01: qty 20

## 2014-12-01 MED ORDER — ZIPRASIDONE MESYLATE 20 MG IM SOLR
20.0000 mg | Freq: Once | INTRAMUSCULAR | Status: AC
  Administered 2014-12-01: 20 mg via INTRAMUSCULAR

## 2014-12-01 NOTE — ED Notes (Signed)
Resumed care from jill rn.  Pt sleeping .  Easily aroused.  Pt continues to sleep since meds given this morning.  Pt did not eat supper tray.  siderails up x 2.

## 2014-12-01 NOTE — ED Notes (Signed)
BEHAVIORAL HEALTH ROUNDING Patient sleeping: no Patient alert and oriented: alert yes, oriented no Behavior appropriate: YES Describe behavior: No inappropriate or unacceptable behaviors noted at this time.  Nutrition and fluids offered: YES Toileting and hygiene offered: YES Sitter present: Behavioral tech rounding every 15 minutes on patient to ensure safety.  Law enforcement present: YES Law enforcement agency: [employer] (ODS) 

## 2014-12-01 NOTE — BHH Counselor (Signed)
Phone call received from [employer] (vonda Maisie Fus [phone]) requesting Pt update. Writer explained to caller that she had no knowledge of Pt guardian. Writer provided caller with fax number to send guardianship paperwork.

## 2014-12-01 NOTE — ED Notes (Signed)
BEHAVIORAL HEALTH ROUNDING Patient sleeping: Yes.   Patient alert and oriented:no Behavior appropriate: Yes.  ; If no, describe:  Nutrition and fluids offered: Yes  Toileting and hygiene offered: Yes  Sitter present: yes Law enforcement present: Yes  

## 2014-12-01 NOTE — ED Notes (Signed)
Pt became combative when this writer attempted to administer morning medication. Pt refused medications.

## 2014-12-01 NOTE — BHH Counselor (Signed)
Writer received "Letters of Appointment Guardian Of the Person" paperwork. Writer informed ED Secretary Misty Stanley) of received paperwork and will provide to ED so it may be scanned into chart.   Writer informed Guardian Hazle Coca [phone]. [phone]) that Pt was medically cleared and awaiting placement. Guardian informed writer that Pt would be allowed to return to group home upon placement d/c.

## 2014-12-01 NOTE — ED Notes (Signed)

## 2014-12-01 NOTE — ED Notes (Signed)
Pt assisted with snack, pt has consumed with RN assist one bag of chips and 1 graham cracker. Pt has consumed approx 60mL of cola. Pt then states "i don't want that sour stuff no more." will continue to offer pt nourishment when awake.

## 2014-12-01 NOTE — ED Notes (Signed)
BEHAVIORAL HEALTH ROUNDING Patient sleeping: No. Patient alert and oriented: no Behavior appropriate: Yes.  ; If no, describe:  Nutrition and fluids offered: Yes  Toileting and hygiene offered: Yes  Sitter present: no Law enforcement present: Yes  

## 2014-12-01 NOTE — ED Notes (Signed)
BEHAVIORAL HEALTH ROUNDING Patient sleeping: Yes.   Patient alert and oriented: pt sleeping Behavior appropriate: Yes.  ; If no, describe:  Nutrition and fluids offered: pt sleeping Toileting and hygiene offered: pt sleeping Sitter present: no Law enforcement present: Yes  

## 2014-12-01 NOTE — ED Notes (Signed)
fsbs checked due to pt sleeping soundly, result of 55 obtained and dr sung notified, pt awakened easily and fed peanut butter and graham crackers with a carton of whole milk. Vital signs rechecked and stable. Cont to monitor, no distress noted

## 2014-12-01 NOTE — ED Notes (Signed)
Patient assigned to appropriate care area. Patient oriented to unit/care area: Informed that, for their safety, care areas are designed for safety and monitored by staff at all times; and visiting hours explained to patient. Patient verbalizes understanding, and verbal contract for safety obtained. 

## 2014-12-01 NOTE — ED Notes (Signed)
BEHAVIORAL HEALTH ROUNDING Patient sleeping: YES Patient alert and oriented: no Behavior appropriate: YES Describe behavior: No inappropriate or unacceptable behaviors noted at this time.  Nutrition and fluids offered: YES Toileting and hygiene offered: YES Sitter present: Interior and spatial designer rounding every 15 minutes on patient to ensure safety.  Law enforcement present: Loss adjuster, chartered agency: [employer] television/film [employer] (OD[employer])
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
## 2014-12-01 NOTE — ED Notes (Signed)
Report received from brandy, rn. Pt awake in bed, declines pain at this time. Warm blanket provided for comfort. Pt with hypoglycemia during day and this am. Will recheck fsbs in half hour. Skin normal color warm and dry. Vss.

## 2014-12-01 NOTE — ED Notes (Signed)
Pt incontinent of a large amount of urine, pt bathed, clothes changed

## 2014-12-01 NOTE — ED Notes (Signed)
BEHAVIORAL HEALTH ROUNDING Patient sleeping: Yes.   Patient alert and oriented: not applicable Behavior appropriate: Yes.  ; If no, describe:  Nutrition and fluids offered: Yes  Toileting and hygiene offered: Yes  Sitter present: yes Law enforcement present: Yes  

## 2014-12-01 NOTE — ED Notes (Signed)
Pt assisted to toilet 

## 2014-12-01 NOTE — ED Notes (Addendum)
ENVIRONMENTAL ASSESSMENT Potentially harmful objects out of patient reach: Yes.   Personal belongings secured: Yes.   Patient dressed in hospital provided attire only: Yes.   Plastic bags out of patient reach: Yes.   Patient care equipment (cords, cables, call bells, lines, and drains) shortened, removed, or accounted for: Yes.  one blood pressure cord, one pulse ox cord, one cardiac monitor cord with 5 lead cords, one suction tubing cord, one computer power cord, one mouse cord, one keyboard cord, 2 oxygen tubing cords.  Equipment and supplies removed from bottom of stretcher: Yes.   Potentially toxic materials out of patient reach: Yes.   Sharps container removed or out of patient reach: one sharp container in room, pt unable to reach from bed.

## 2014-12-01 NOTE — ED Notes (Signed)
BEHAVIORAL HEALTH ROUNDING Patient sleeping: No. Patient alert and oriented: yes Behavior appropriate: Yes.  ; If no, describe:  Nutrition and fluids offered: Yes  Toileting and hygiene offered: Yes  Sitter present: yes Law enforcement present: Yes  

## 2014-12-01 NOTE — ED Notes (Signed)
BEHAVIORAL HEALTH ROUNDING Patient sleeping: no Patient alert and oriented: alert yes, oriented no Behavior appropriate: YES Describe behavior: No inappropriate or unacceptable behaviors noted at this time.  Nutrition and fluids offered: YES Toileting and hygiene offered: YES Sitter present: Interior and spatial designer rounding every 15 minutes on patient to ensure safety.  Law enforcement present: Loss adjuster, chartered agency: [employer] television/film set ([employer])

## 2014-12-01 NOTE — ED Notes (Signed)
BEHAVIORAL HEALTH ROUNDING Patient sleeping: yes Patient alert and oriented: pt sleeping Behavior appropriate: Yes.  ; If no, describe:  Nutrition and fluids offered: pt sleeping Toileting and hygiene offered: pt sleeping Sitter present: no Law enforcement present: Yes

## 2014-12-01 NOTE — ED Notes (Signed)

## 2014-12-01 NOTE — ED Notes (Signed)
BEHAVIORAL HEALTH ROUNDING Patient sleeping: no Patient alert and oriented: alert yes oriented no Behavior appropriate: YES Describe behavior: Singing Nutrition and fluids offered: YES Toileting and hygiene offered: YES Sitter present: Interior and spatial designer rounding every 15 minutes on patient to ensure safety.  Law enforcement present: Loss adjuster, chartered agency: [employer] television/film set ([employer])
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
## 2014-12-01 NOTE — ED Notes (Signed)
Pt refused glucerna shake and meds.  Pt continues to have covers over head cursing nurse with officer at bedside.  md aware.

## 2014-12-01 NOTE — ED Notes (Signed)
Pt assisted with po fluids, pt has consumed 16 ounces of orange juice. Pt states "i was thirsty, but that's all i need" after drinking juice. Will continue to monitor. Pt declines need to use restroom.

## 2014-12-01 NOTE — ED Notes (Signed)
Report to jill and janie, rns.

## 2014-12-01 NOTE — ED Notes (Signed)
BEHAVIORAL HEALTH ROUNDING Patient sleeping: No. Patient alert and oriented: yes Behavior appropriate: No.; If no, describe: cursing and hitting Nutrition and fluids offered: Yes  Toileting and hygiene offered: Yes  Sitter present: yes Law enforcement present: Yes

## 2014-12-01 NOTE — ED Notes (Signed)

## 2014-12-01 NOTE — ED Notes (Signed)
Pt assisted with breakfast. Pt ate sausage biscuit and orange. Pt drank carton of milk.

## 2014-12-01 NOTE — ED Notes (Signed)
BEHAVIORAL HEALTH ROUNDING Patient sleeping: Yes.   Patient alert and oriented: yes Behavior appropriate: Yes.  ; If no, describe:  Nutrition and fluids offered: Yes  Toileting and hygiene offered: Yes  Sitter present: yes Law enforcement present: Yes  

## 2014-12-01 NOTE — ED Notes (Signed)
Pt sleeping, skin normal color warm and dry. resps unlabored.

## 2014-12-01 NOTE — ED Provider Notes (Signed)
-----------------------------------------   6:30 AM on 12/01/2014 -----------------------------------------   Blood pressure 125/64, pulse 81, temperature 98.4 F (36.9 C), temperature source Oral, resp. rate 16, height  (1.778 m), weight 157 lb (71.215 kg), SpO2 96 %.  Blood sugar check was 77 overnight.  Increase to 110s with food and orange juice. Calm and cooperative at this time.  Disposition is pending per Psychiatry/Behavioral Medicine team recommendations.     Irean Hong, MD 12/01/14 0630

## 2014-12-01 NOTE — ED Notes (Signed)
BEHAVIORAL HEALTH ROUNDING Patient sleeping: YES Patient alert and oriented: no Behavior appropriate: YES Describe behavior: No inappropriate or unacceptable behaviors noted at this time.  Nutrition and fluids offered: YES Toileting and hygiene offered: YES Sitter present: Behavioral tech rounding every 15 minutes on patient to ensure safety.  Law enforcement present: YES Law enforcement agency: [employer] (ODS) 

## 2014-12-02 DIAGNOSIS — F209 Schizophrenia, unspecified: Secondary | ICD-10-CM | POA: Diagnosis not present

## 2014-12-02 LAB — GLUCOSE, CAPILLARY
GLUCOSE-CAPILLARY: 114 mg/dL — AB (ref 65–99)
GLUCOSE-CAPILLARY: 70 mg/dL (ref 65–99)
Glucose-Capillary: 75 mg/dL (ref 65–99)

## 2014-12-02 MED ORDER — LEVETIRACETAM 500 MG PO TABS
ORAL_TABLET | ORAL | Status: AC
  Administered 2014-12-02: 1500 mg via ORAL
  Filled 2014-12-02: qty 3

## 2014-12-02 MED ORDER — AMLODIPINE BESYLATE 5 MG PO TABS
ORAL_TABLET | ORAL | Status: AC
  Administered 2014-12-02: 10 mg via ORAL
  Filled 2014-12-02: qty 2

## 2014-12-02 MED ORDER — VITAMIN B-12 1000 MCG PO TABS
ORAL_TABLET | ORAL | Status: AC
Start: 2014-12-02 — End: 2014-12-02
  Administered 2014-12-02: 1000 ug via ORAL
  Filled 2014-12-02: qty 1
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
## 2014-12-02 NOTE — ED Notes (Signed)
BEHAVIORAL HEALTH ROUNDING Patient sleeping: Yes.   Patient alert and oriented: not applicable Behavior appropriate: Yes.    Nutrition and fluids offered: No Toileting and hygiene offered: No Sitter present: q15 minute observations Law enforcement present: Yes [employer] 

## 2014-12-02 NOTE — ED Notes (Signed)
Report given to [facility] in beh quad.  Pt moved from room 24 to room 22.

## 2014-12-02 NOTE — ED Notes (Signed)
Gave patient a bed bath, and changed his brief. Also patient consumed one bottle of glucerna dietary supplement.

## 2014-12-02 NOTE — ED Notes (Signed)
BEHAVIORAL HEALTH ROUNDING Patient sleeping: No. Patient alert and oriented: yes Behavior appropriate: Yes.  ; If no, describe:  Nutrition and fluids offered: Yes  Toileting and hygiene offered: Yes  Sitter present: not applicable Law enforcement present: Yes  

## 2014-12-02 NOTE — ED Notes (Signed)
BEHAVIORAL HEALTH ROUNDING Patient sleeping: No. Patient alert and oriented: yes Behavior appropriate: Yes.  ; If no, describe:  Nutrition and fluids offered: Yes  Toileting and hygiene offered: Yes  Sitter present: no Law enforcement present: Yes  

## 2014-12-02 NOTE — ED Notes (Signed)
Pt in room with blanket over head. No complaints or concerns voiced at this time. No abnormal behavior noted at this time. Will continue to monitor with q15 min checks and. [employer] officer in area.

## 2014-12-02 NOTE — ED Notes (Signed)
Patient consumed one cup of chocolate ice cream.

## 2014-12-02 NOTE — ED Provider Notes (Signed)
-----------------------------------------   6:56 AM on 12/02/2014 -----------------------------------------   Blood pressure 155/79, pulse 72, temperature 98.4 F (36.9 C), temperature source Oral, resp. rate 20, height  (1.778 m), weight 157 lb (71.215 kg), SpO2 99 %.  The patient had no acute events since last update.  Calm and cooperative at this time.  Disposition is pending per Psychiatry/Behavioral Medicine team recommendations.   Still awaiting placement  Rebecka Apley, MD 12/02/14 [phone]

## 2014-12-02 NOTE — ED Notes (Signed)
Pt continues to have covers over head.  Easily aroused.

## 2014-12-02 NOTE — ED Notes (Signed)

## 2014-12-02 NOTE — ED Notes (Signed)
Patient assigned to appropriate care area. Patient oriented to unit/care area: Informed that, for their safety, care areas are designed for safety and monitored by staff at all times; and visiting hours explained to patient. Patient verbalizes understanding, and verbal contract for safety obtained. 

## 2014-12-02 NOTE — Consult Note (Signed)
  Psychiatry: Follow-up elderly patient with a history of seizure disorder and schizophrenia. No change to behavior today. He has been declined by the [place] because of no bed availability. Has been declined by multiple other facilities. We had planned to discharge him back to his group home today but his guardian has reportedly refused to allow [place] to do that. No change to medication at this time. Vital signs stable. We are continuing to work on some kind of disposition plan.

## 2014-12-02 NOTE — ED Notes (Signed)
Pt in room. No complaints or concerns voiced at this time. No abnormal behavior noted at this time. Will continue to monitor with q15 min checks. [employer] officer in area. 

## 2014-12-02 NOTE — ED Notes (Signed)
BEHAVIORAL HEALTH ROUNDING  Patient sleeping: Yes.  Patient alert and oriented: no  Behavior appropriate: Yes. ; If no, describe:  Nutrition and fluids offered: No  Toileting and hygiene offered: No  Sitter present: no  Law enforcement present: Yes   

## 2014-12-02 NOTE — ED Notes (Signed)
BEHAVIORAL HEALTH ROUNDING Patient sleeping: Yes.   Patient alert and oriented: dementia Behavior appropriate: Yes.  ; If no, describe:  Nutrition and fluids offered: sleeping Toileting and hygiene offered: sleeping Sitter present: no Law enforcement present: Yes

## 2014-12-02 NOTE — ED Notes (Signed)
BEHAVIORAL HEALTH ROUNDING Patient sleeping: Yes.   Patient alert and oriented: yes Behavior appropriate: Yes.  ; If no, describe:  Nutrition and fluids offered: Yes  Toileting and hygiene offered: Yes  Sitter present: not applicable Law enforcement present: Yes  

## 2014-12-02 NOTE — ED Notes (Signed)
ENVIRONMENTAL ASSESSMENT Potentially harmful objects out of patient reach: Yes.   Personal belongings secured: Yes.   Patient dressed in hospital provided attire only: Yes.   Plastic bags out of patient reach: Yes.   Patient care equipment (cords, cables, call bells, lines, and drains) shortened, removed, or accounted for: Yes.   Equipment and supplies removed from bottom of stretcher: Yes.   Potentially toxic materials out of patient reach: Yes.   Sharps container removed or out of patient reach: Yes.     BEHAVIORAL HEALTH ROUNDING Patient sleeping: Yes.   Patient alert and oriented: no Behavior appropriate: Yes.  ; If no, describe:  Nutrition and fluids offered: No Toileting and hygiene offered: No Sitter present: no Law enforcement present: Yes  

## 2014-12-03 DIAGNOSIS — F209 Schizophrenia, unspecified: Secondary | ICD-10-CM | POA: Diagnosis not present

## 2014-12-03 DIAGNOSIS — F203 Undifferentiated schizophrenia: Secondary | ICD-10-CM | POA: Diagnosis not present

## 2014-12-03 LAB — GLUCOSE, CAPILLARY
GLUCOSE-CAPILLARY: 120 mg/dL — AB (ref 65–99)
GLUCOSE-CAPILLARY: 132 mg/dL — AB (ref 65–99)

## 2014-12-03 MED ORDER — OLANZAPINE 5 MG PO TABS
15.0000 mg | ORAL_TABLET | Freq: Every day | ORAL | Status: DC
  Administered 2014-12-03 – 2014-12-04 (×2): 15 mg via ORAL
  Filled 2014-12-03 (×2): qty 1

## 2014-12-03 NOTE — ED Notes (Signed)
BEHAVIORAL HEALTH ROUNDING Patient sleeping: No. Patient alert and oriented: yes Behavior appropriate: Yes.  ; If no, describe:  Nutrition and fluids offered: Yes  Toileting and hygiene offered: Yes  Sitter present: yes Law enforcement present: Yes  

## 2014-12-03 NOTE — ED Notes (Signed)
BEHAVIORAL HEALTH ROUNDING Patient sleeping: Yes.   Patient alert and oriented: no Behavior appropriate: Yes.  ; If no, describe:  Nutrition and fluids offered: Yes  Toileting and hygiene offered: Yes  Sitter present: no Law enforcement present: Yes  

## 2014-12-03 NOTE — ED Notes (Addendum)
BEHAVIORAL HEALTH ROUNDING Patient sleeping: No. Patient alert and oriented: no Behavior appropriate: Yes.  ; If no, describe:  Nutrition and fluids offered: Yes  Toileting and hygiene offered: Yes  Sitter present: no Law enforcement present: Yes  

## 2014-12-03 NOTE — ED Notes (Signed)
BEHAVIORAL HEALTH ROUNDING Patient sleeping: Yes.   Patient alert and oriented: sleeping Behavior appropriate: Yes.  ; If no, describe: sleeping Nutrition and fluids offered: sleeping Toileting and hygiene offered: sleeping Sitter present: no Law enforcement present: Yes  and [employer] 

## 2014-12-03 NOTE — ED Notes (Signed)
Sandwich and drink given.  

## 2014-12-03 NOTE — ED Notes (Signed)
Pt. Noted in room. No complaints or concerns voiced. No distress noted. Will continue to monitor Q 15 minute rounds continue.

## 2014-12-03 NOTE — ED Notes (Signed)

## 2014-12-03 NOTE — ED Notes (Signed)
Pt. Noted in room. No complaints or concerns voiced. No distress or abnormal behavior noted. Will continue to monitor Q 15 minute rounds continue. 

## 2014-12-03 NOTE — ED Notes (Signed)
BEHAVIORAL HEALTH ROUNDING  Patient sleeping: Yes.  Patient alert and oriented: no  Behavior appropriate: Yes. ; If no, describe:  Nutrition and fluids offered: No  Toileting and hygiene offered: No  Sitter present: no  Law enforcement present: Yes   

## 2014-12-03 NOTE — ED Notes (Signed)
Dr clapacs in with pt now.  

## 2014-12-03 NOTE — ED Notes (Signed)
Resumed care from greg rn.  Pt in recliner watching tv in room.  Pt calm and cooperative at this time.

## 2014-12-03 NOTE — ED Notes (Signed)
Report received from [employer]. Pt. Alert and  denies pain.  Pt. Instructed to come to me with problems or concerns.Will continue to monitor for safety Q 15 minute checks. Security present

## 2014-12-03 NOTE — ED Notes (Signed)
Pt was offered a recliner chair, pt agreed,

## 2014-12-03 NOTE — Consult Note (Signed)
  Psychiatry: Follow-up for this 82 year old man with a history of seizures and schizophrenia. Patient has a complaint about still being in the hospital. His memory is poor and he does not remember having seen anyone doctor before and doesn't remember why he is here. In the hospital he is verbally irritable but has not been physically violent or aggressive. He is compliant with medication. Vital signs stable. No new physical complaints.  Patient appears to be stable and not acutely dangerous. We have suggested discharge back home. Guardian has been supposedly blocking attempts at that so far. We will have to reevaluate that tomorrow.  I will increase the dose of his Zyprexa to 15 mg at night. He does have an irritable affect tonight although he is not acutely threatening. He seems to be a little bit paranoid still.  The rest of the review of systems is negative.  No further seizures identified.  No change to diagnosis of schizophrenia and seizure disorder and dementia. Working on discharge planning.

## 2014-12-03 NOTE — ED Notes (Signed)
meds given.  Pt ambulatory in room.  Pt calm and cooperative.

## 2014-12-03 NOTE — Progress Notes (Signed)
LCSW made several calls to [facility], [place] all are on DIVERSION . Recalled this evening for 2nd shift and they all remain on DIVERSION- No psychiatric beds available. LCSW was advised to call in the am.

## 2014-12-03 NOTE — Progress Notes (Signed)
LCSW received call from [employer] Timothy [name]'s [phone]) stating he spoke to [name] from [facility] [phone] today and she stated he can return to Group home when EDP or psychiatrist discharge him.  LCSW will await psychiatric advisement.

## 2014-12-03 NOTE — ED Notes (Signed)
BEHAVIORAL HEALTH ROUNDING Patient sleeping: No. Patient alert and oriented: yes Behavior appropriate: Yes.  ; If no, describe:  Nutrition and fluids offered: Yes  Toileting and hygiene offered: Yes  Sitter present: yes Law enforcement present: Yes , 

## 2014-12-03 NOTE — ED Provider Notes (Addendum)
Filed Vitals:   12/03/14 0730  BP: 142/78  Pulse: 98  Temp: 97.8 F (36.6 C)  Resp: 20    Patient remains medically stable for psychiatric evaluation and disposition.  Emily Filbert, MD 12/03/14 0981  Emily Filbert, MD 12/03/14 [phone]

## 2014-12-03 NOTE — ED Notes (Signed)
BEHAVIORAL HEALTH ROUNDING Patient sleeping: No. Patient alert and oriented: no Behavior appropriate: Yes.  ; If no, describe:  Nutrition and fluids offered: Yes  Toileting and hygiene offered: Yes  Sitter present: no Law enforcement present: Yes  

## 2014-12-04 DIAGNOSIS — F203 Undifferentiated schizophrenia: Secondary | ICD-10-CM | POA: Diagnosis not present

## 2014-12-04 DIAGNOSIS — F209 Schizophrenia, unspecified: Secondary | ICD-10-CM | POA: Diagnosis not present
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
## 2014-12-04 NOTE — ED Provider Notes (Signed)
-----------------------------------------   4:57 AM on 12/04/2014 -----------------------------------------   Blood pressure 129/77, pulse 84, temperature 97.8 F (36.6 C), temperature source Oral, resp. rate 20, height  (1.778 m), weight 157 lb (71.215 kg), SpO2 99 %.  The patient had no acute events since last update.  Calm and cooperative at this time.  Disposition is pending per Psychiatry/Behavioral Medicine team recommendations.     Sharman Cheek, MD 12/04/14 [phone]

## 2014-12-04 NOTE — ED Notes (Signed)
BEHAVIORAL HEALTH ROUNDING Patient sleeping: No. Patient alert and oriented: yes Behavior appropriate: Yes.  ; If no, describe:  Nutrition and fluids offered: yes Toileting and hygiene offered: Yes  Sitter present: q15 minute observations and security camera monitoring Law enforcement present: Yes  ODS  

## 2014-12-04 NOTE — ED Notes (Signed)
Pt observed ambulating to the BR with a steady gait

## 2014-12-04 NOTE — ED Notes (Signed)
Pt sleeping. 

## 2014-12-04 NOTE — ED Notes (Signed)
Lunch provided along with an extra drink   Appropriate to stimulation  No verbalized needs or concerns at this time  NAD assessed  Continue to monitor 

## 2014-12-04 NOTE — ED Notes (Signed)
BEHAVIORAL HEALTH ROUNDING Patient sleeping: No. Patient alert and oriented: yes Behavior appropriate: Yes.  ; If no, describe:  Nutrition and fluids offered: yes Toileting and hygiene offered: Yes  Sitter present: q15 minute observations and security camera monitoring Law enforcement present: Yes  ODS   He is sitting in a chair in his room

## 2014-12-04 NOTE — ED Notes (Signed)
Pt. Noted in room. No complaints or concerns voiced. No distress or abnormal behavior noted. Will continue to monitor with security cameras. Q 15 minute rounds continue. 

## 2014-12-04 NOTE — ED Notes (Signed)
Am meds administered as ordered  Assessment completed   Appropriate to stimulation  No verbalized needs or concerns at this time  NAD assessed  Continue to monitor 

## 2014-12-04 NOTE — ED Notes (Signed)
ED BHU PLACEMENT JUSTIFICATION Is the patient under IVC or is there intent for IVC: No. Is the patient medically cleared: Yes.   Is there vacancy in the ED BHU: yes Is the population mix appropriate for patient: No. Is the patient awaiting placement in inpatient or outpatient setting: No. Has the patient had a psychiatric consult: Yes.   Survey of unit performed for contraband, proper placement and condition of furniture, tampering with fixtures in bathroom, shower, and each patient room: Yes.  ; Findings:  APPEARANCE/BEHAVIOR calm and cooperative NEURO ASSESSMENT Orientation: time, place and person Hallucinations: No.None noted (Hallucinations) Speech: normal Gait: normal RESPIRATORY ASSESSMENT Normal expansion.  Clear to auscultation.  No rales, rhonchi, or wheezing. CARDIOVASCULAR ASSESSMENT regular rate and rhythm, S1, S2 normal, no murmur, click, rub or gallop GASTROINTESTINAL ASSESSMENT soft, nontender, BS WNL, no r/g EXTREMITIES normal strength, tone, and muscle mass PLAN OF CARE Provide calm/safe environment. Vital signs assessed twice daily. ED BHU Assessment once each 12-hour shift. Collaborate with intake RN daily or as condition indicates. Assure the ED Devony Mcgrady has rounded once each shift. Provide and encourage hygiene. Provide redirection as needed. Assess for escalating behavior; address immediately and inform ED Marcel Gary. Pt is being discharged to group home awaiting on transportation in by group home in the AM

## 2014-12-04 NOTE — ED Notes (Signed)
BEHAVIORAL HEALTH ROUNDING Patient sleeping: No. Patient alert and oriented: yes Behavior appropriate: Yes.  ; If no, describe: resting in bed given sandwich tray now pt didn't eat snack earlier or drink much Nutrition and fluids offered: Yes  Toileting and hygiene offered: Yes  Sitter present: yes q15 minute observations and security camera monitoring Law enforcement present: Yes ODS

## 2014-12-04 NOTE — BHH Counselor (Signed)
Counselor faxed Pt's guardian, Waldron Session, the Pt's discharge summary at [phone]

## 2014-12-04 NOTE — ED Notes (Signed)
Patient observed lying in bed with eyes closed  Even, unlabored respirations observed   NAD pt appears to be sleeping  I will continue to monitor along with every 15 minute visual observations and ongoing security camera monitoring    Blue behavioral specific recliner noted to be in his room - I have inspected it for contraband  - none found  Continue to monitor

## 2014-12-04 NOTE — Discharge Instructions (Signed)
Schizophrenia °Schizophrenia is a mental illness. It may cause disturbed or disorganized thinking, speech, or behavior. People with schizophrenia have problems functioning in one or more areas of life: work, school, home, or relationships. People with schizophrenia are at increased risk for suicide, certain chronic physical illnesses, and unhealthy behaviors, such as smoking and drug use. °People who have family members with schizophrenia are at higher risk of developing the illness. Schizophrenia affects men and women equally but usually appears at an earlier age (teenage or early adult years) in men.  °SYMPTOMS °The earliest symptoms are often subtle (prodrome) and may go unnoticed until the illness becomes more severe (first-break psychosis). Symptoms of schizophrenia may be continuous or may come and go in severity. Episodes often are triggered by major life events, such as family stress, college, military service, marriage, pregnancy or child birth, divorce, or loss of a loved one. People with schizophrenia may see, hear, or feel things that do not exist (hallucinations). They may have false beliefs in spite of obvious proof to the contrary (delusions). Sometimes speech is incoherent or behavior is odd or withdrawn.  °DIAGNOSIS °Schizophrenia is diagnosed through an assessment by your caregiver. Your caregiver will ask questions about your thoughts, behavior, mood, and ability to function in daily life. Your caregiver may ask questions about your medical history and use of alcohol or drugs, including prescription medication. Your caregiver may also order blood tests and imaging exams. Certain medical conditions and substances can cause symptoms that resemble schizophrenia. Your caregiver may refer you to a mental health specialist for evaluation. There are three major criterion for a diagnosis of schizophrenia: °· Two or more of the following five symptoms are present for a month or longer: °¨ Delusions. Often  the delusions are that you are being attacked, harassed, cheated, persecuted or conspired against (persecutory delusions). °¨ Hallucinations.   °¨ Disorganized speech that does not make sense to others. °¨ Grossly disorganized (confused or unfocused) behavior or extremely overactive or underactive motor activity (catatonia). °¨ Negative symptoms such as bland or blunted emotions (flat affect), loss of will power (avolition), and withdrawal from social contacts (social isolation). °· Level of functioning in one or more major areas of life (work, school, relationships, or self-care) is markedly below the level of functioning before the onset of illness.   °· There are continuous signs of illness (either mild symptoms or decreased level of functioning) for at least 6 months or longer. °TREATMENT  °Schizophrenia is a long-term illness. It is best controlled with continuous treatment rather than treatment only when symptoms occur. The following treatments are used to manage schizophrenia: °· Medication--Medication is the most effective and important form of treatment for schizophrenia. Antipsychotic medications are usually prescribed to help manage schizophrenia. Other types of medication may be added to relieve any symptoms that may occur despite the use of antipsychotic medications. °· Counseling or talk therapy--Individual, group, or family counseling may be helpful in providing education, support, and guidance. Many people with schizophrenia also benefit from social skills and job skills (vocational) training. °A combination of medication and counseling is best for managing the disorder over time. A procedure in which electricity is applied to the brain through the scalp (electroconvulsive therapy) may be used to treat catatonic schizophrenia or schizophrenia in people who cannot take or do not respond to medication and counseling. °Document Released: 02/26/2000 Document Revised: 10/31/2012 Document Reviewed:  05/23/2012 °ExitCare® Patient Information ©2015 [employer]. This information is not intended to replace advice given to you by   your health care provider. Make sure you discuss any questions you have with your health care provider. Continue all your medications. [facility] with another [facility] is usually [facility]. Return as needed

## 2014-12-04 NOTE — ED Notes (Signed)
Supper provided along with an extra drink    Appropriate to stimulation  No verbalized needs or concerns at this time  NAD assessed  Continue to monitor 

## 2014-12-04 NOTE — ED Notes (Addendum)
BEHAVIORAL HEALTH ROUNDING Patient sleeping: no Patient alert and oriented: yes Behavior appropriate: Yes.  ; If no, describe: resting in bed Nutrition and fluids offered: Yes denies snack or drink Toileting and hygiene offered: Yes  Sitter present: yes q15 minute observations and security camera monitoring  Law enforcement present: Yes ODS

## 2014-12-04 NOTE — ED Provider Notes (Addendum)
She was seen by Dr. Mat Carne packs we will discharge him. Patient's group home wants him back and he can follow-up with Dr. Raelene Bott as he usually does  Timothy Natal, MD 12/04/14 1608 The computer misunderstood my speech and wrote she instead of he  Timothy Natal, MD 12/04/14 [phone]

## 2014-12-04 NOTE — ED Notes (Signed)

## 2014-12-04 NOTE — ED Notes (Signed)
BEHAVIORAL HEALTH ROUNDING Patient sleeping: Yes.   Patient alert and oriented: yes Behavior appropriate: Yes.  ; If no, describe:  Nutrition and fluids offered: Yes  Toileting and hygiene offered: Yes  Sitter present: no Law enforcement present: Yes  

## 2014-12-04 NOTE — ED Notes (Signed)
BEHAVIORAL HEALTH ROUNDING Patient sleeping: Yes.   Patient alert and oriented: yes Behavior appropriate: Yes.  ; If no, describe:  Nutrition and fluids offered: Yes  Toileting and hygiene offered: Yes  Sitter present: No Law enforcement present: Yes  

## 2014-12-04 NOTE — ED Notes (Signed)
BEHAVIORAL HEALTH ROUNDING Patient sleeping: Yes.   Patient alert and oriented: yes Behavior appropriate: Yes.  ; If no, describe:  Nutrition and fluids offered: Yes  Toileting and hygiene offered: Yes  Sitter present: yes Law enforcement present: Yes  

## 2014-12-04 NOTE — BHH Counselor (Signed)
Counselor spoke to [employer] (Tim Roger[facility] [phone]) and [name] from [facility] [phone] about discharge plans.   Tim stated he has a safety plan (use of plastic utensils) in place for Pt to return and feels he is ready for the Pt upon discharging.  Waldron Session states she is ok with Pt to be discharged if the Psych MD feels he is stable.   Counselor spoke to Dr. Toni Amend and informed him of the updated discharge information on the Pt.  Awaiting his psychiatric advisement.   Anne Shutter, LPCA Therapeutic Triage Specialist

## 2014-12-04 NOTE — ED Notes (Signed)
ED BHU PLACEMENT JUSTIFICATION Is the patient under IVC or is there intent for IVC: Yes.   Is the patient medically cleared: Yes.   Is there vacancy in the ED BHU: Yes.   Is the population mix appropriate for patient: Yes.   Is the patient awaiting placement in inpatient or outpatient setting: Yes. Pending placement  Has the patient had a psychiatric consult: Yes.   Survey of unit performed for contraband, proper placement and condition of furniture, tampering with fixtures in bathroom, shower, and each patient room: Yes.  ; Findings:  APPEARANCE/BEHAVIOR Calm and cooperative NEURO ASSESSMENT Orientation: oriented x3  Denies pain Hallucinations: No.None noted (Hallucinations) Speech: Normal Gait: normal RESPIRATORY ASSESSMENT Even  Unlabored respirations  CARDIOVASCULAR ASSESSMENT Pulses equal   regular rate  Skin warm and dry   GASTROINTESTINAL ASSESSMENT no GI complaint EXTREMITIES Full ROM  PLAN OF CARE Provide calm/safe environment. Vital signs assessed twice daily. ED BHU Assessment once each 12-hour shift. Collaborate with intake RN daily or as condition indicates. Assure the ED provider has rounded once each shift. Provide and encourage hygiene. Provide redirection as needed. Assess for escalating behavior; address immediately and inform ED provider.  Assess family dynamic and appropriateness for visitation as needed: Yes.  ; If necessary, describe findings:  Educate the patient/family about BHU procedures/visitation: Yes.  ; If necessary, describe findings:

## 2014-12-04 NOTE — BHH Counselor (Signed)
Writer followed up on referral to [employer]. Pt was denied due to "behavioral acuity."  Anne Shutter, LPCA Therapeutic Triage Specialist

## 2014-12-04 NOTE — BHH Counselor (Signed)
Counselor spoke to Timothy [name] at [employer] in regards to Pt discharging. Mr. Timothy [name] will pick up Pt @ 8:00am on 12/05/14.

## 2014-12-04 NOTE — ED Notes (Signed)

## 2014-12-04 NOTE — ED Notes (Signed)
BEHAVIORAL HEALTH ROUNDING Patient sleeping: Yes.  til I woke him up to take meds Patient alert and oriented: yes Behavior appropriate: Yes.  ; If no, describe: resting in bed Nutrition and fluids offered: Yes  Toileting and hygiene offered: Yes  Sitter present: yes q15 minute observations and security camera monitoring Law enforcement present: Yes ODS

## 2014-12-04 NOTE — ED Notes (Signed)
Breakfast provided   Patient observed lying in bed with eyes closed  Even, unlabored respirations observed   NAD pt appears to be sleeping  I will continue to monitor along with every 15 minute visual observations and ongoing security camera monitoring    

## 2014-12-05 MED ORDER — ACETAMINOPHEN 325 MG PO TABS
ORAL_TABLET | ORAL | Status: AC
  Filled 2014-12-05: qty 2

## 2014-12-05 MED ORDER — ACETAMINOPHEN 325 MG PO TABS
650.0000 mg | ORAL_TABLET | Freq: Once | ORAL | Status: AC
  Administered 2014-12-05: 650 mg via ORAL

## 2014-12-05 NOTE — ED Notes (Signed)
BEHAVIORAL HEALTH ROUNDING Patient sleeping: Yes.   Patient alert and oriented: yes Behavior appropriate: Yes.  ; If no, describe: sleeping Nutrition and fluids offered: sleeping Toileting and hygiene offered: sleeping Sitter present: yes q15 minute observations and security camera monitoring Law enforcement present: Yes ODS 

## 2014-12-05 NOTE — ED Notes (Signed)
Patient observed lying in bed with eyes closed  Even, unlabored respirations observed   NAD pt appears to be sleeping  I will continue to monitor along with every 15 minute visual observations and ongoing security camera monitoring    

## 2014-12-05 NOTE — ED Notes (Signed)
1/1 bags of belongings were returned to him and he verbalized that he received back all belongings that came here with him  - he had no shoes   Discharge instructions reviewed with him and he verbalized agreement and understanding    Appropriate to stimulation  No verbalized needs or concerns at this time

## 2014-12-05 NOTE — ED Notes (Signed)
BEHAVIORAL HEALTH ROUNDING Patient sleeping: Yes.   Patient alert and oriented: yes  Behavior appropriate: Yes.  ; If no, describe: pt is in bed however restless at times Nutrition and fluids offered: Yes  Toileting and hygiene offered: Yes pt had episoded of being incontient Sitter present: yes q15 minute observations and security camera monitoring Law enforcement present: Yes ODS

## 2014-12-05 NOTE — ED Notes (Signed)
Received a call from registration [facility] is here to take him back to his group home

## 2014-12-05 NOTE — ED Notes (Signed)
Breakfast provided  He is lying in bed - his pants are down around his ankles - private areas are covered by a blanket  Appropriate to stimulation  No verbalized needs or concerns at this time  NAD assessed  Continue to monitor

## 2014-12-05 NOTE — ED Provider Notes (Signed)
-----------------------------------------   5:47 AM on 12/05/2014 -----------------------------------------   Blood pressure 137/72, pulse 65, temperature 98.1 F (36.7 C), temperature source Oral, resp. rate 18, height  (1.778 m), weight 157 lb (71.215 kg), SpO2 98 %.  The patient had no acute events since last update.  Calm and cooperative at this time.  Disposition is pending per Psychiatry/Behavioral Medicine team recommendations. Tentative plan is the patient is to be discharged to a skilled nursing facility once it is signed. He remains medically stable awaiting this assignment and discharge.   Sharman Cheek, MD 12/05/14 [phone]

## 2014-12-21 ENCOUNTER — Encounter: Payer: Self-pay | Admitting: Emergency Medicine

## 2014-12-21 ENCOUNTER — Emergency Department: Payer: Medicare Other

## 2014-12-21 ENCOUNTER — Emergency Department
Admission: EM | Admit: 2014-12-21 | Discharge: 2014-12-22 | Disposition: A | Discharge status: Other Acute Inpatient Hospital | Payer: Medicare Other | Attending: Emergency Medicine | Admitting: Emergency Medicine

## 2014-12-21 DIAGNOSIS — I10 Essential (primary) hypertension: Secondary | ICD-10-CM | POA: Insufficient documentation

## 2014-12-21 DIAGNOSIS — F200 Paranoid schizophrenia: Secondary | ICD-10-CM | POA: Insufficient documentation

## 2014-12-21 DIAGNOSIS — Z79899 Other long term (current) drug therapy: Secondary | ICD-10-CM | POA: Insufficient documentation

## 2014-12-21 DIAGNOSIS — Z046 Encounter for general psychiatric examination, requested by authority: Secondary | ICD-10-CM | POA: Diagnosis present

## 2014-12-21 DIAGNOSIS — R451 Restlessness and agitation: Secondary | ICD-10-CM | POA: Diagnosis not present

## 2014-12-21 DIAGNOSIS — Z720 Tobacco use: Secondary | ICD-10-CM | POA: Insufficient documentation

## 2014-12-21 DIAGNOSIS — R443 Hallucinations, unspecified: Secondary | ICD-10-CM | POA: Diagnosis not present

## 2014-12-21 DIAGNOSIS — F203 Undifferentiated schizophrenia: Secondary | ICD-10-CM | POA: Diagnosis present

## 2014-12-21 DIAGNOSIS — F29 Unspecified psychosis not due to a substance or known physiological condition: Secondary | ICD-10-CM | POA: Insufficient documentation

## 2014-12-21 DIAGNOSIS — M25562 Pain in left knee: Secondary | ICD-10-CM | POA: Diagnosis not present

## 2014-12-21 DIAGNOSIS — F0390 Unspecified dementia without behavioral disturbance: Secondary | ICD-10-CM | POA: Diagnosis not present

## 2014-12-21 LAB — CBC WITH DIFFERENTIAL/PLATELET
Basophils Absolute: 0 10*3/uL (ref 0–0.1)
Basophils Relative: 1 %
EOS ABS: 0.1 10*3/uL (ref 0–0.7)
Eosinophils Relative: 2 %
HEMATOCRIT: 42.2 % (ref 40.0–52.0)
HEMOGLOBIN: 13.8 g/dL (ref 13.0–18.0)
LYMPHS ABS: 1.2 10*3/uL (ref 1.0–3.6)
LYMPHS PCT: 31 %
MCH: 26.5 pg (ref 26.0–34.0)
MCHC: 32.8 g/dL (ref 32.0–36.0)
MCV: 80.8 fL (ref 80.0–100.0)
MONOS PCT: 12 %
Monocytes Absolute: 0.4 10*3/uL (ref 0.2–1.0)
NEUTROS ABS: 2 10*3/uL (ref 1.4–6.5)
Neutrophils Relative %: 54 %
Platelets: 213 10*3/uL (ref 150–440)
RBC: 5.22 MIL/uL (ref 4.40–5.90)
RDW: 14.5 % (ref 11.5–14.5)
WBC: 3.7 10*3/uL — AB (ref 3.8–10.6)

## 2014-12-21 LAB — ETHANOL

## 2014-12-21 LAB — COMPREHENSIVE METABOLIC PANEL
ALT: 22 U/L (ref 17–63)
ANION GAP: 6 (ref 5–15)
AST: 36 U/L (ref 15–41)
Albumin: 4.1 g/dL (ref 3.5–5.0)
Alkaline Phosphatase: 88 U/L (ref 38–126)
BUN: 15 mg/dL (ref 6–20)
CHLORIDE: 107 mmol/L (ref 101–111)
CO2: 29 mmol/L (ref 22–32)
Calcium: 9.7 mg/dL (ref 8.9–10.3)
Creatinine, Ser: 1.05 mg/dL (ref 0.61–1.24)
GFR calc Af Amer: >60 mL/min (ref >60)
Glucose, Bld: 77 mg/dL (ref 65–99)
POTASSIUM: 4 mmol/L (ref 3.5–5.1)
SODIUM: 142 mmol/L (ref 135–145)
Total Bilirubin: 0.7 mg/dL (ref 0.3–1.2)
Total Protein: 8 g/dL (ref 6.5–8.1)

## 2014-12-21 LAB — ACETAMINOPHEN LEVEL

## 2014-12-21 LAB — TROPONIN I

## 2014-12-21 LAB — SALICYLATE LEVEL

## 2014-12-21 MED ORDER — LEVETIRACETAM 500 MG PO TABS
1500.0000 mg | ORAL_TABLET | Freq: Two times a day (BID) | ORAL | Status: DC
  Administered 2014-12-21 – 2014-12-22 (×3): 1500 mg via ORAL
  Filled 2014-12-21: qty 3

## 2014-12-21 MED ORDER — OLANZAPINE 10 MG PO TABS: 10.0000 mg | ORAL_TABLET | Freq: Every day | ORAL | Status: DC

## 2014-12-21 MED ORDER — AMLODIPINE BESYLATE 5 MG PO TABS
5.0000 mg | ORAL_TABLET | Freq: Every day | ORAL | Status: DC
  Administered 2014-12-21 – 2014-12-22 (×2): 5 mg via ORAL
  Filled 2014-12-21: qty 1

## 2014-12-21 MED ORDER — AMLODIPINE BESYLATE 5 MG PO TABS
ORAL_TABLET | ORAL | Status: AC
  Administered 2014-12-21: 5 mg via ORAL
  Filled 2014-12-21: qty 1

## 2014-12-21 MED ORDER — POTASSIUM CHLORIDE CRYS ER 20 MEQ PO TBCR
EXTENDED_RELEASE_TABLET | ORAL | Status: AC
  Administered 2014-12-21: 20 meq via ORAL
  Filled 2014-12-21: qty 1

## 2014-12-21 MED ORDER — DIPHENHYDRAMINE HCL 25 MG PO CAPS
ORAL_CAPSULE | ORAL | Status: AC
  Filled 2014-12-21: qty 2

## 2014-12-21 MED ORDER — VITAMIN B-12 1000 MCG PO TABS
1000.0000 ug | ORAL_TABLET | Freq: Every day | ORAL | Status: DC
  Administered 2014-12-22: 1000 ug via ORAL
  Filled 2014-12-21: qty 1

## 2014-12-21 MED ORDER — ATORVASTATIN CALCIUM 20 MG PO TABS
ORAL_TABLET | ORAL | Status: AC
  Filled 2014-12-21: qty 1

## 2014-12-21 MED ORDER — DIPHENHYDRAMINE HCL 25 MG PO CAPS
50.0000 mg | ORAL_CAPSULE | Freq: Three times a day (TID) | ORAL | Status: DC | PRN
  Administered 2014-12-21: 50 mg via ORAL

## 2014-12-21 MED ORDER — LEVETIRACETAM 500 MG PO TABS
ORAL_TABLET | ORAL | Status: AC
  Administered 2014-12-21: 1500 mg via ORAL
  Filled 2014-12-21: qty 3

## 2014-12-21 MED ORDER — ATORVASTATIN CALCIUM 20 MG PO TABS: 10.0000 mg | ORAL_TABLET | Freq: Every day | ORAL | Status: DC

## 2014-12-21 MED ORDER — DIPHENHYDRAMINE HCL 50 MG/ML IJ SOLN: 50.0000 mg | Freq: Three times a day (TID) | INTRAMUSCULAR | Status: DC | PRN

## 2014-12-21 MED ORDER — POTASSIUM CHLORIDE CRYS ER 20 MEQ PO TBCR
20.0000 meq | EXTENDED_RELEASE_TABLET | Freq: Every day | ORAL | Status: DC
  Administered 2014-12-21 – 2014-12-22 (×2): 20 meq via ORAL
  Filled 2014-12-21: qty 1

## 2014-12-21 NOTE — ED Notes (Signed)
Pt given breakfast tray

## 2014-12-21 NOTE — ED Notes (Signed)

## 2014-12-21 NOTE — ED Notes (Signed)
NAD noted at this time. Pt resting in bed with his eyes closed and TV on. Respirations are even unlabored, pt awakens with mild stimuli. Will continue 15 minute safety checks.

## 2014-12-21 NOTE — ED Notes (Signed)

## 2014-12-21 NOTE — ED Notes (Signed)
ED BHU PLACEMENT JUSTIFICATION Is the patient under IVC or is there intent for IVC: Yes.   Is the patient medically cleared: No. Is there vacancy in the ED BHU: Yes.   Is the population mix appropriate for patient: No. Is the patient awaiting placement in inpatient or outpatient setting: No. Has the patient had a psychiatric consult: No. Survey of unit performed for contraband, proper placement and condition of furniture, tampering with fixtures in bathroom, shower, and each patient room: Yes.  ; Findings: Unremarkable APPEARANCE/BEHAVIOR calm and cooperative NEURO ASSESSMENT Orientation: time and person Hallucinations: No.None noted (Hallucinations) This RN given in report pt had hallucinations, on evaluation of patient at this time, pt denies visual or auditory hallucination.  Speech: Garbled Gait: Normal RESPIRATORY ASSESSMENT Normal expansion.  Clear to auscultation.  No rales, rhonchi, or wheezing. CARDIOVASCULAR ASSESSMENT regular rate and rhythm, S1, S2 normal, no murmur, click, rub or gallop GASTROINTESTINAL ASSESSMENT soft, nontender, BS WNL, no r/g EXTREMITIES normal strength, tone, and muscle mass PLAN OF CARE Provide calm/safe environment. Vital signs assessed twice daily. ED BHU Assessment once each 12-hour shift. Collaborate with intake RN daily or as condition indicates. Assure the ED provider has rounded once each shift. Provide and encourage hygiene. Provide redirection as needed. Assess for escalating behavior; address immediately and inform ED provider.  Assess family dynamic and appropriateness for visitation as needed: Yes.  ; If necessary, describe findings:  Educate the patient/family about BHU procedures/visitation: Yes.  ; If necessary, describe findings:

## 2014-12-21 NOTE — ED Notes (Signed)
Patient resting quietly in room. No noted distress or abnormal behaviors noted. Will continue 15 minute checks and observation by security camera for safety. 

## 2014-12-21 NOTE — ED Notes (Signed)
NAD noted at this time. Respirations even and unlabored. Pt repositioned in the bed. Door closed and lights dimmed for patient comfort. Will continue 15 min safety checks at this time.

## 2014-12-21 NOTE — ED Notes (Signed)
BEHAVIORAL HEALTH ROUNDING Patient sleeping: Yes.   Patient alert and oriented: not applicable SLEEPING Behavior appropriate: Yes.  ; If no, describe: SLEEPING Nutrition and fluids offered: No SLEEPING Toileting and hygiene offered: NoSLEEPING Sitter present: not applicable Law enforcement present: Yes ODS 

## 2014-12-21 NOTE — ED Provider Notes (Signed)
[facility] Emergency Department Provider Note  ____________________________________________  Time seen: Approximately 1:09 AM  I have reviewed the triage vital signs and the nursing notes.   HISTORY  Chief Complaint Hallucinations and Medical Clearance  Limited by dementia  HPI Timothy [name] is a 81 y.o. male who presents to the ED from nursing facility under IVC. Patient has a history of schizophrenia and is actively hallucinating that the artery is after him and it is not safe for him to cross a street. He was found hiding this evening. He is paranoid and thinks people are out to get him. Complains of left knee pain. It is unclear if patient had a recent fall or injury as he is very vague on the details of his left knee pain.   Past Medical History  Diagnosis Date  . Seizures (HCC)   . TIA (transient ischemic attack)   . Schizophrenia [facility])     Patient Active Problem List   Diagnosis Date Noted  . 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 Seizures (HCC) 11/28/2014  . TIA (transient ischemic attack) 11/28/2014  . Schizophrenia (HCC) 11/28/2014  . Hypertension 11/28/2014  . Dyslipidemia 11/28/2014  . Dementia 11/28/2014    History reviewed. No pertinent past surgical history.  Current Outpatient Rx  Name  Route  Sig  Dispense  Refill  . amLODipine (NORVASC) 10 MG tablet   Oral   Take 10 mg by mouth daily.         Marland Kitchen atorvastatin (LIPITOR) 20 MG tablet   Oral   Take 10 mg by mouth at bedtime.         . lamoTRIgine (LAMICTAL) 25 MG tablet   Oral   Take 50 mg by mouth 2 (two) times daily. Take 2 tabs twice a day for 2 weeks, then 3 tabs twice a day for two weeks, then 4 tabs twice a day then on.         . levETIRAcetam (KEPPRA) 750 MG tablet   Oral   Take 1,500 mg by mouth 2 (two) times daily.      0   . OLANZapine (ZYPREXA) 10 MG tablet   Oral   Take 10 mg by mouth at bedtime.         . potassium chloride SA (K-DUR,KLOR-CON)  20 MEQ tablet   Oral   Take 20 mEq by mouth once.         . vitamin B-12 (CYANOCOBALAMIN) 1000 MCG tablet   Oral   Take 1,000 mcg by mouth daily.           Allergies Review of patient's allergies indicates no known allergies.  History reviewed. No pertinent family history.  Social History Social History  Substance Use Topics  . Smoking status: Current Every Day Smoker -- 5.00 packs/day for 25 years    Types: Cigarettes  . Smokeless tobacco: None  . Alcohol Use: No    Review of Systems Constitutional: No fever/chills Eyes: No visual changes. ENT: No sore throat. Cardiovascular: Denies chest pain. Respiratory: Denies shortness of breath. Gastrointestinal: No abdominal pain.  No nausea, no vomiting.  No diarrhea.  No constipation. Genitourinary: Negative for dysuria. Musculoskeletal: Negative for back pain. Skin: Negative for rash. Neurological: Negative for headaches, focal weakness or numbness. Psychiatric:Positive for hallucinations.  10-point ROS otherwise negative.  ____________________________________________   PHYSICAL EXAM:  VITAL SIGNS: ED Triage Vitals  Enc Vitals Group     BP 12/21/14 0044 130/75 mmHg     Pulse  Rate 12/21/14 0044 80     Resp 12/21/14 0044 20     Temp 12/21/14 0044 98.5 F (36.9 C)     Temp Source 12/21/14 0044 Oral     SpO2 12/21/14 0044 100 %     Weight 12/21/14 0044 160 lb (72.576 kg)     Height 12/21/14 0044 5' 10.5" (1.791 m)     Head Cir --      Peak Flow --      Pain Score 12/21/14 0044 10     Pain Loc --      Pain Edu? --      Excl. in GC? --     Constitutional: Alert and oriented. Well appearing and mildly agitated. Eyes: Conjunctivae are normal. PERRL. EOMI. Head: Atraumatic. Nose: No congestion/rhinnorhea. Mouth/Throat: Mucous membranes are moist.  Oropharynx non-erythematous. Neck: No stridor.   Cardiovascular: Normal rate, regular rhythm. Grossly normal heart sounds.  Good peripheral  circulation. Respiratory: Normal respiratory effort.  No retractions. Lungs CTAB. Gastrointestinal: Soft and nontender. No distention. No abdominal bruits. No CVA tenderness. Musculoskeletal: No lower extremity tenderness nor edema.  Unable to elicit left knee tenderness on exam. No joint effusions. Neurologic:  Normal speech and language. No gross focal neurologic deficits are appreciated. No gait instability. Skin:  Skin is warm, dry and intact. No rash noted. Psychiatric: Mood and affect are flat. Speech and behavior are flat.  ____________________________________________   LABS (all labs ordered are listed, but only abnormal results are displayed)  Labs Reviewed  CBC WITH DIFFERENTIAL/PLATELET - Abnormal; Notable for the following:    WBC 3.7 (*)    All other components within normal limits  ACETAMINOPHEN LEVEL - Abnormal; Notable for the following:    Acetaminophen (Tylenol), Serum <10 (*)    All other components within normal limits  COMPREHENSIVE METABOLIC PANEL  ETHANOL  SALICYLATE LEVEL  TROPONIN I  URINALYSIS COMPLETEWITH MICROSCOPIC ([facility] ONLY)  URINE DRUG SCREEN, QUALITATIVE ([facility] ONLY)   ____________________________________________  EKG  ED ECG REPORT I, SUNG,JADE J, the attending physician, personally viewed and interpreted this ECG.   Date: 12/21/2014  EKG Time: 0148  Rate: 67  Rhythm: normal EKG, normal sinus rhythm  Axis: LAD  Intervals: None  ST&T Change: Nonspecific  ____________________________________________  RADIOLOGY  Left knee x-rays (viewed by me, interpreted per Dr. Clovis Riley): Negative for acute fracture. ____________________________________________   PROCEDURES  Procedure(s) performed: None  Critical Care performed: No  ____________________________________________   INITIAL IMPRESSION / ASSESSMENT AND PLAN / ED COURSE  Pertinent labs & imaging results that were available during my care of the patient were reviewed by me and  considered in my medical decision making (see chart for details).  81 year old male with a history of schizophrenia who presents with paranoid hallucinations. Will obtain screening labwork, EKG, urinalysis, consult TTS and psychiatry.  ----------------------------------------- 6:48 AM on 12/21/2014 -----------------------------------------  No events overnight. Will maintain IVC pending psychiatry consult today. ____________________________________________   FINAL CLINICAL IMPRESSION(S) / ED DIAGNOSES  Final diagnoses:  Paranoid schizophrenia (HCC)  Hallucinations  Psychosis, unspecified psychosis type      Irean Hong, MD 12/21/14 [phone]

## 2014-12-21 NOTE — ED Notes (Signed)
BEHAVIORAL HEALTH ROUNDING Patient sleeping: No. Patient alert and oriented: yes Behavior appropriate: Yes.  ; If no, describe:  Nutrition and fluids offered: Yes  Toileting and hygiene offered: Yes  Sitter present: Yes Law enforcement present: Yes ODS   Pt assisted to the bathroom via EDT. NAD noted at this time. Pt resting in bed watching TV.

## 2014-12-21 NOTE — ED Notes (Signed)
BEHAVIORAL HEALTH ROUNDING Patient sleeping: Yes.   Patient alert and oriented: yes Behavior appropriate: Yes.  ; If no, describe:  Nutrition and fluids offered: Yes  Toileting and hygiene offered: Yes  Sitter present: yes Law enforcement present: Yes ODS  

## 2014-12-21 NOTE — ED Notes (Signed)
Patient asleep in room. No noted distress or abnormal behavior. Will continue 15 minute checks and observation by security cameras for safety. 

## 2014-12-21 NOTE — ED Notes (Signed)
NAD noted at this time. Pt resting in bed with eyes closed and TV on. Respirations even and unlabored at this time. Will continue with 15 min safety checks.

## 2014-12-21 NOTE — ED Notes (Signed)
TTS Ava at bedside at this time

## 2014-12-21 NOTE — ED Notes (Signed)
BEHAVIORAL HEALTH ROUNDING Patient sleeping: No. Patient alert and oriented: yes Behavior appropriate: Yes.  ;  Nutrition and fluids offered: Yes  Toileting and hygiene offered: Yes  Sitter present: yes Law enforcement present: Yes  

## 2014-12-21 NOTE — ED Notes (Signed)
Patient refused to urinate into cup for urine sample. MD aware. Staff to try again later.

## 2014-12-21 NOTE — ED Notes (Signed)
Patient with guarded affect, somewhat paranoid on interaction, He denies hallucinations. Will continue to monitor closely and maintain all safety checks.

## 2014-12-21 NOTE — ED Notes (Signed)
Patient refused food. He was offered alternative meal but he refused that too. He did drink beverages. He seems more paranoid, sitting in chair in his room.  Will continue to monitor mental status and behaviors.  Maintain all safety checks.

## 2014-12-21 NOTE — ED Notes (Signed)
BEHAVIORAL HEALTH ROUNDING  Patient sleeping: No.  Patient alert and oriented: yes  Behavior appropriate: Yes. ; If no, describe:  Nutrition and fluids offered: Yes  Toileting and hygiene offered: Yes  Sitter present: not applicable  Law enforcement present: Yes ODS  

## 2014-12-21 NOTE — ED Notes (Signed)
BEHAVIORAL HEALTH ROUNDING  Patient sleeping: No.  Patient alert and oriented: yes oriented to his baseline.  Behavior appropriate: Yes. ; If no, describe:  Nutrition and fluids offered: Yes  Toileting and hygiene offered: Yes  Sitter present: not applicable  Law enforcement present: Yes ODS

## 2014-12-21 NOTE — ED Notes (Signed)
NAD noted at this time. Pt resting in bed with eyes closed, respirations even and unlabored at this time. Will continue with 15 min safety checks.

## 2014-12-21 NOTE — ED Notes (Signed)
MD Sung at bedside. 

## 2014-12-21 NOTE — ED Notes (Signed)
Patient getting more agitated, appearing to be responding to internal stimuli. He came out of his room with his pants around his ankles. He was escorted back to his room. He then demanded "fried chicken" although earlier he refused all food. He was offered a sandwich which he refused. Irritable affect and tone of voice.  Case discussed with Dr. Jenne Campus. Patient received PRN medications to assist with reducing agitation. He did take medication orally.  Will continue to monitor mood and mental status. Maintain all safety checks.

## 2014-12-21 NOTE — ED Notes (Signed)
Timothy [name] is IVC pending Psych consult

## 2014-12-21 NOTE — ED Notes (Signed)
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

## 2014-12-21 NOTE — ED Notes (Signed)
Pt. Noted in room resting quietly;. No complaints or concerns voiced. No distress or abnormal behavior noted. Will continue to monitor with security cameras. Q 15 minute rounds continue. 

## 2014-12-21 NOTE — BH Assessment (Addendum)
Assessment Note  Timothy [name] is a 81 year old male that resides at [facility] [phone] Luna Kitchens).  Patient was brought to the ED by [facility]CV due to responding to internal stimuli.    Per IVC paperwork the patient is actively hallucinating that the artery is after him and it is not safe for him to cross a street. He was found hiding this evening. He is paranoid and thinks people are out to get him. Complains of left knee pain. During the assessment the patient was responding to internal stimuli.   Patient was orientated to person, place time and situation.  Patient reports that he know the date, and where he lives and he knows that he is in the hospital in [place].  However, the patient reports that he is afraid that the army is after him and therefore, he is not able to give me any more information. Therefore, patient is a poor historian.   Writer received collateral information from the Group Home.  Per Group Home staff the patient has been living at this facility for a year.  Patient has a previous diagnosis of Schizophrenia.  Per Group Home staff members the patient has refused to take his medication for the past two days.    Diagnosis: Schizophrenia   Past Medical History:  Past Medical History  Diagnosis Date  . Seizures (HCC)   . TIA (transient ischemic attack)   . Schizophrenia (HCC)     History reviewed. No pertinent past surgical history.  Family History: History reviewed. No pertinent family history.  Social History:  reports that he has been smoking Cigarettes.  He has a 125 pack-year smoking history. He does not have any smokeless tobacco history on file. He reports that he does not drink alcohol or use illicit drugs.  Additional Social History:  Alcohol / Drug Use History of alcohol / drug use?: No history of alcohol / drug abuse  CIWA: CIWA-Ar BP: 130/75 mmHg Pulse Rate: 80 COWS:    Allergies: No Known Allergies  Home Medications:  (Not in a  hospital admission)  OB/GYN Status:  No LMP for male patient.  General Assessment Data Location of Assessment: [facility] ED TTS Assessment: In system Is this a Tele or Face-to-Face Assessment?: Face-to-Face Is this an Initial Assessment or a Re-assessment for this encounter?: Initial Assessment Marital status: Single Maiden name: NA Is patient pregnant?: No Pregnancy Status: No Living Arrangements: Group Home Can pt return to current living arrangement?: Yes Admission Status: Involuntary Is patient capable of signing voluntary admission?: No Referral Source: Self/Family/Friend Insurance type: Medicare  Medical Screening Exam [facility] Walk-in ONLY) Medical Exam completed: Yes  Crisis Care Plan Living Arrangements: Group Home Name of Psychiatrist: unable to participate in assessment.  Name of Therapist: unable to participate in assessment  Education Status Is patient currently in school?: No Current Grade: NA Highest grade of school patient has completed: NA Name of school: NA Contact person: NA  Risk to self with the past 6 months Suicidal Ideation: No Has patient been a risk to self within the past 6 months prior to admission? : No Suicidal Intent: No Has patient had any suicidal intent within the past 6 months prior to admission? : No Is patient at risk for suicide?: No Suicidal Plan?: No Has patient had any suicidal plan within the past 6 months prior to admission? : No Access to Means: No What has been your use of drugs/alcohol within the last 12 months?: NA Previous Attempts/Gestures: No  How many times?: 0 Other Self Harm Risks: None Reportred Triggers for Past Attempts:  (NA) Intentional Self Injurious Behavior: None Family Suicide History: No Recent stressful life event(s): Other (Comment) (None Reported) Persecutory voices/beliefs?: No Depression: Yes Depression Symptoms: Despondent, Loss of interest in usual pleasures Substance abuse history and/or treatment for  substance abuse?: No Suicide prevention information given to non-admitted patients: Not applicable  Risk to Others within the past 6 months Homicidal Ideation: No Does patient have any lifetime risk of violence toward others beyond the six months prior to admission? : Yes (comment) Thoughts of Harm to Others: No Comment - Thoughts of Harm to Others: NA Current Homicidal Intent: No Current Homicidal Plan: No Describe Current Homicidal Plan: Calm Access to Homicidal Means: No Describe Access to Homicidal Means: NA Identified Victim: NA History of harm to others?: No Assessment of Violence: None Noted Violent Behavior Description: None Reported Does patient have access to weapons?: No Criminal Charges Pending?: No Does patient have a court date: No Is patient on probation?: No  Psychosis Hallucinations: Auditory, Visual Delusions: Grandiose  Mental Status Report Appearance/Hygiene: In hospital gown, Disheveled Eye Contact: Poor Motor Activity: Freedom of movement Speech: Slow, Soft Level of Consciousness: Alert Mood: Anxious, Suspicious Affect: Fearful Anxiety Level: Minimal Thought Processes: Flight of Ideas Judgement: Unimpaired Orientation: Person, Place, Time, Situation Obsessive Compulsive Thoughts/Behaviors: None  Cognitive Functioning Concentration: Decreased Memory: Recent Impaired, Remote Impaired IQ: Average Insight: Poor Impulse Control: Poor Appetite: Fair Weight Loss: 0 Weight Gain: 0 Sleep: Decreased Total Hours of Sleep: 5 Vegetative Symptoms: Staying in bed  ADLScreening [facility] Assessment Services) Patient's cognitive ability adequate to safely complete daily activities?: Yes Patient able to express need for assistance with ADLs?: Yes Independently performs ADLs?: Yes (appropriate for developmental age)  Prior Inpatient Therapy Prior Inpatient Therapy: No Prior Therapy Dates: na Prior Therapy Facilty/Provider(s): na Reason for Treatment:  na  Prior Outpatient Therapy Prior Outpatient Therapy: Yes Prior Therapy Dates: Ongiong  Prior Therapy Facilty/Provider(s): Unable to remember  Reason for Treatment: Unable to remember  Does patient have an ACCT team?: No Does patient have Intensive In-House Services?  : No Does patient have [facility] services? : No Does patient have P4CC services?: No  ADL Screening (condition at time of admission) Patient's cognitive ability adequate to safely complete daily activities?: Yes Is the patient deaf or have difficulty hearing?: No Does the patient have difficulty seeing, even when wearing glasses/contacts?: No Does the patient have difficulty concentrating, remembering, or making decisions?: Yes Patient able to express need for assistance with ADLs?: Yes Does the patient have difficulty dressing or bathing?: No Independently performs ADLs?: Yes (appropriate for developmental age) Does the patient have difficulty walking or climbing stairs?: No Weakness of Legs: None Weakness of Arms/Hands: None  Home Assistive Devices/Equipment Home Assistive Devices/Equipment: None    Abuse/Neglect Assessment (Assessment to be complete while patient is alone) Physical Abuse: Denies Verbal Abuse: Denies Sexual Abuse: Denies Exploitation of patient/patient's resources: Denies Self-Neglect: Denies Values / Beliefs Cultural Requests During Hospitalization: None Spiritual Requests During Hospitalization: None Consults Spiritual Care Consult Needed: No Social Work Consult Needed: No Merchant navy officer (For Healthcare) Does patient have an advance directive?: No Would patient like information on creating an advanced directive?: No - patient declined information    Additional Information 1:1 In Past 12 Months?: No CIRT Risk: No Elopement Risk: No Does patient have medical clearance?: Yes     Disposition: Pending psych disposition in the morning by the Psychiatrist. Disposition Initial  Assessment Completed for this Encounter: Yes  On Site Evaluation by:   Reviewed with Physician:    Phillip Heal LaVerne 12/21/2014 2:54 AM

## 2014-12-21 NOTE — Consult Note (Signed)
[place] Psychiatry Consult   Reason for Consult:  Paranoia, hallucinations Referring Physician:  Paulette Blanch, MD  Patient Identification: Timothy [name] MRN:  [id number]   Principal Diagnosis: Undifferentiated schizophrenia [facility])   Diagnosis:   Patient Active Problem List   Diagnosis Date Noted  . Undifferentiated schizophrenia ([place]) [F20.3]   . Seizures ([place]) [R56.9] 11/28/2014  . TIA (transient ischemic attack) [G45.9] 11/28/2014  . Schizophrenia ([place]) [F20.9] 11/28/2014  . Hypertension [I10] 11/28/2014  . Dyslipidemia [E78.5] 11/28/2014  . Dementia [F03.90] 11/28/2014    Total Time spent with patient: 30 minutes  Subjective:   Timothy [name] is a 81 y.o. male patient with a h/o schizophrenia, TIAs and seizures who presents to [facility] ED for paranoia and hallucinations. Per ED MD notes, the pt is paranoid and believes the army is after him. He was found hiding yesterday evening. Per ED counselor note, the pt was brought in under IVC for responding to internal stimuli.  IVC paperwork noted the pt to be actively hallucinating. At the time of that interview, the pt was oriented to date, where he lives and current location. Pt has not taken his medication for the past 2 days at his group home.   Nursing notes reviewed. Group home staff reported the pt to be actively hallucinating and he was hiding behind a brick wall asking if he's been shot.  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 He has a h/o being combative. He was brought to [facility] under IVC by police.   Today on interview, the pt shared he is currently doing well. He is oriented to person, place, time and situation. The pt is difficult to understand at times. The pt shared he has missed his medication for several days.  He believes he took medication last night. He is not certain what happens when he does not take his psychotropic medications but the pt did state that when he does not take Keppra, he will have seizures.  He shared his last seizure was August 2016.    He denies hopelessness, helplessness and worthlessness. He denies symptoms consistent with depression and mania. He denies SI, HI & AVH.   The pt served in [employer] from [phone]. He was non-combat. He denies any past traumatic/life threatening events.   Past Psychiatric History: Dx: Schizophrenia Psychiatrist: At the [facility]  Therapist: Yes at [facility] Hospitalizations: Ladon Applebaum, [facility] ECT: Denies Suicide attempt/Self-harm: Denies Homicide attempts/harming others: Denies Previous Medication Trials: Denies  Risk to Self: Suicidal Ideation: No Suicidal Intent: No Is patient at risk for suicide?: No Suicidal Plan?: No Access to Means: No What has been your use of drugs/alcohol within the last 12 months?: NA How many times?: 0 Other Self Harm Risks: None Reportred Triggers for Past Attempts:  (NA) Intentional Self Injurious Behavior: None Risk to Others: Homicidal Ideation: No Thoughts of Harm to Others: No Comment - Thoughts of Harm to Others: NA Current Homicidal Intent: No Current Homicidal Plan: No Describe Current Homicidal Plan: Calm Access to Homicidal Means: No Describe Access to Homicidal Means: NA Identified Victim: NA History of harm to others?: No Assessment of Violence: None Noted Violent Behavior Description: None Reported Does patient have access to weapons?: No Criminal Charges Pending?: No Does patient have a court date: No Prior Inpatient Therapy: Prior Inpatient Therapy: No Prior Therapy Dates: na Prior Therapy Facilty/Provider(s): na Reason for Treatment: na Prior Outpatient Therapy: Prior Outpatient Therapy: Yes Prior Therapy Dates: Ongiong  Prior Therapy Facilty/Provider(s): Unable to  remember  Reason for Treatment: Unable to remember  Does patient have an ACCT team?: No Does patient have Intensive In-House Services?  : No Does patient have [facility] services? : No Does patient have P4CC services?: No  Past  Medical History:  Past Medical History  Diagnosis Date  . Seizures ([place])   . TIA (transient ischemic attack)   . Schizophrenia ([place])    History reviewed. No pertinent past surgical history. Family History: History reviewed. No pertinent family history.  Family Psychiatric  History:  Maternal: Unknown, unable to understand Paternal: Unknown, unable to understand Suicide: Unknown, unable to understand  Social History:  History  Alcohol Use No     History  Drug Use No    Social History   Social History  . Marital Status: Widowed    Spouse Name: N/A  . Number of Children: N/A  . Years of Education: N/A   Social History Main Topics  . Smoking status: Current Every Day Smoker -- 5.00 packs/day for 25 years    Types: Cigarettes  . Smokeless tobacco: None  . Alcohol Use: No  . Drug Use: No  . Sexual Activity: Not Currently   Other Topics Concern  . None   Social History Narrative   Additional Social History:    History of alcohol / drug use?: No history of alcohol / drug abuse  Lives in a group home.  Served in the [employer] from 1955-59 but denies combat.  [employer] connected: Yes  Marital status: Not married Children: 4 children Religion: Unknown  Allergies:  No Known Allergies  Labs:  Results for orders placed or performed during the hospital encounter of 12/21/14 (from the past 48 hour(s))  CBC with Differential     Status: Abnormal   Collection Time: 12/21/14  1:09 AM  Result Value Ref Range   WBC 3.7 (L) 3.8 - 10.6 K/uL   RBC 5.22 4.40 - 5.90 MIL/uL   Hemoglobin 13.8 13.0 - 18.0 g/dL   HCT 42.2 40.0 - 52.0 %   MCV 80.8 80.0 - 100.0 fL   MCH 26.5 26.0 - 34.0 pg   MCHC 32.8 32.0 - 36.0 g/dL   RDW 14.5 11.5 - 14.5 %   Platelets 213 150 - 440 K/uL   Neutrophils Relative % 54 %   Neutro Abs 2.0 1.4 - 6.5 K/uL   Lymphocytes Relative 31 %   Lymphs Abs 1.2 1.0 - 3.6 K/uL   Monocytes Relative 12 %   Monocytes Absolute 0.4 0.2 - 1.0 K/uL   Eosinophils Relative 2 %    Eosinophils Absolute 0.1 0 - 0.7 K/uL   Basophils Relative 1 %   Basophils Absolute 0.0 0 - 0.1 K/uL  Comprehensive metabolic panel     Status: None   Collection Time: 12/21/14  1:09 AM  Result Value Ref Range   Sodium 142 135 - 145 mmol/L   Potassium 4.0 3.5 - 5.1 mmol/L   Chloride 107 101 - 111 mmol/L   CO2 29 22 - 32 mmol/L   Glucose, Bld 77 65 - 99 mg/dL   BUN 15 6 - 20 mg/dL   Creatinine, Ser 1.05 0.61 - 1.24 mg/dL   Calcium 9.7 8.9 - 10.3 mg/dL   Total Protein 8.0 6.5 - 8.1 g/dL   Albumin 4.1 3.5 - 5.0 g/dL   AST 36 15 - 41 U/L   ALT 22 17 - 63 U/L   Alkaline Phosphatase 88 38 - 126 U/L   Total Bilirubin 0.7  0.3 - 1.2 mg/dL   GFR calc non Af Amer >60 >60 mL/min   GFR calc Af Amer >60 >60 mL/min    Comment: (NOTE) The eGFR has been calculated using the CKD EPI equation. This calculation has not been validated in all clinical situations. eGFR's persistently <60 mL/min signify possible Chronic Kidney Disease.    Anion gap 6 5 - 15  Acetaminophen level     Status: Abnormal   Collection Time: 12/21/14  1:09 AM  Result Value Ref Range   Acetaminophen (Tylenol), Serum <10 (L) 10 - 30 ug/mL    Comment:        THERAPEUTIC CONCENTRATIONS VARY SIGNIFICANTLY. A RANGE OF 10-30 ug/mL MAY BE AN EFFECTIVE CONCENTRATION FOR MANY PATIENTS. HOWEVER, SOME ARE BEST TREATED AT CONCENTRATIONS OUTSIDE THIS RANGE. ACETAMINOPHEN CONCENTRATIONS >150 ug/mL AT 4 HOURS AFTER INGESTION AND >50 ug/mL AT 12 HOURS AFTER INGESTION ARE OFTEN ASSOCIATED WITH TOXIC REACTIONS.   Ethanol     Status: None   Collection Time: 12/21/14  1:09 AM  Result Value Ref Range   Alcohol, Ethyl (B) <5 <5 mg/dL    Comment:        LOWEST DETECTABLE LIMIT FOR SERUM ALCOHOL IS 5 mg/dL FOR MEDICAL PURPOSES ONLY   Salicylate level     Status: None   Collection Time: 12/21/14  1:09 AM  Result Value Ref Range   Salicylate Lvl <6.7 2.8 - 30.0 mg/dL  Troponin I     Status: None   Collection Time: 12/21/14   1:09 AM  Result Value Ref Range   Troponin I <0.03 <0.031 ng/mL    Comment:        NO INDICATION OF MYOCARDIAL INJURY.     No current facility-administered medications for this encounter.   Current Outpatient Prescriptions  Medication Sig Dispense Refill  . amLODipine (NORVASC) 10 MG tablet Take 10 mg by mouth daily.    Marland Kitchen atorvastatin (LIPITOR) 20 MG tablet Take 10 mg by mouth at bedtime.    . lamoTRIgine (LAMICTAL) 25 MG tablet Take 50 mg by mouth 2 (two) times daily. Take 2 tabs twice a day for 2 weeks, then 3 tabs twice a day for two weeks, then 4 tabs twice a day then on.    . levETIRAcetam (KEPPRA) 750 MG tablet Take 1,500 mg by mouth 2 (two) times daily.  0  . OLANZapine (ZYPREXA) 10 MG tablet Take 10 mg by mouth at bedtime.    . potassium chloride SA (K-DUR,KLOR-CON) 20 MEQ tablet Take 20 mEq by mouth once.    . vitamin B-12 (CYANOCOBALAMIN) 1000 MCG tablet Take 1,000 mcg by mouth daily.      Musculoskeletal: Strength & Muscle Tone: within normal limits Gait & Station: not assessed Patient leans: N/A  Psychiatric Specialty Exam: Review of Systems  Constitutional: Negative.   HENT: Negative.   Eyes: Negative.   Respiratory: Negative.   Cardiovascular: Negative.   Gastrointestinal: Negative.   Genitourinary: Negative.   Musculoskeletal: Negative.   Skin: Negative.   Neurological: Negative.   Endo/Heme/Allergies: Negative.   Psychiatric/Behavioral: Positive for hallucinations. Negative for depression, suicidal ideas, memory loss and substance abuse. The patient is not nervous/anxious and does not have insomnia.     Blood pressure 121/71, pulse 73, temperature 97.9 F (36.6 C), temperature source Oral, resp. rate 16, height 5' 10.5" (1.791 m), weight 72.576 kg (160 lb), SpO2 100 %.Body mass index is 22.63 kg/(m^2).  General Appearance: Casual and Fairly Groomed  Eye Contact::  Good  Speech:  Clear and Coherent and Normal Rate  Volume:  Normal  Mood:  Euthymic   Affect:  Appropriate  Thought Process:  Coherent, Goal Directed, Intact, Linear and Logical  Orientation:  Full (Time, Place, and Person)  Thought Content:  Negative  Suicidal Thoughts:  No  Homicidal Thoughts:  No  Memory:  Immediate;   Good Recent;   Good Remote;   Good  Judgement:  Fair  Insight:  Fair  Psychomotor Activity:  Normal  Concentration:  Good  Recall:  Good  Fund of Knowledge:Good  Language: Fair  Akathisia:  Negative  Handed:  Right  AIMS (if indicated):     Assets:  Communication Skills Desire for Improvement Housing Resilience Social Support Talents/Skills Vocational/Educational  ADL's:  Intact  Cognition: WNL  Sleep:      Treatment Plan Summary: Daily contact with patient to assess and evaluate symptoms and progress in treatment, Medication management and Plan restart home medications  Disposition: 1. Re-evaluate in the morning.   Donita Brooks 12/21/2014 9:57 AM

## 2014-12-22 DIAGNOSIS — F203 Undifferentiated schizophrenia: Secondary | ICD-10-CM

## 2014-12-22 DIAGNOSIS — F0390 Unspecified dementia without behavioral disturbance: Secondary | ICD-10-CM

## 2014-12-22 DIAGNOSIS — F200 Paranoid schizophrenia: Secondary | ICD-10-CM | POA: Diagnosis not present

## 2014-12-22 LAB — URINALYSIS COMPLETE WITH MICROSCOPIC (ARMC ONLY)
BACTERIA UA: NONE SEEN
Bilirubin Urine: NEGATIVE
Glucose, UA: NEGATIVE mg/dL
Hgb urine dipstick: NEGATIVE
Ketones, ur: NEGATIVE mg/dL
Leukocytes, UA: NEGATIVE
Nitrite: NEGATIVE
PH: 5 (ref 5.0–8.0)
PROTEIN: NEGATIVE mg/dL
Specific Gravity, Urine: 1.017 (ref 1.005–1.030)

## 2014-12-22 LAB — URINE DRUG SCREEN, QUALITATIVE (ARMC ONLY)
AMPHETAMINES, UR SCREEN: NOT DETECTED
BENZODIAZEPINE, UR SCRN: NOT DETECTED
Barbiturates, Ur Screen: NOT DETECTED
Cannabinoid 50 Ng, Ur ~~LOC~~: NOT DETECTED
Cocaine Metabolite,Ur ~~LOC~~: NOT DETECTED
MDMA (ECSTASY) UR SCREEN: NOT DETECTED
METHADONE SCREEN, URINE: NOT DETECTED
Opiate, Ur Screen: NOT DETECTED
Phencyclidine (PCP) Ur S: NOT DETECTED
TRICYCLIC, UR SCREEN: NOT DETECTED

## 2014-12-22 MED ORDER — MEMANTINE HCL 5 MG PO TABS
5.0000 mg | ORAL_TABLET | Freq: Every day | ORAL | Status: DC
  Filled 2014-12-22: qty 1

## 2014-12-22 MED ORDER — MEMANTINE HCL 5 MG PO TABS: 5.0000 mg | ORAL_TABLET | Freq: Every day | ORAL | Status: DC

## 2014-12-22 NOTE — ED Provider Notes (Signed)
-----------------------------------------   12:57 PM on 12/22/2014 -----------------------------------------   Blood pressure 137/77, pulse 61, temperature 99.2 F (37.3 C), temperature source Oral, resp. rate 20, height 5' 10.5" (1.791 m), weight 160 lb (72.576 kg), SpO2 97 %.  The patient had no acute events since last update.  Calm and cooperative at this time.  Seen and evaluated by the psychiatrist and cleared for discharge to home. We will write patient a prescription for Namenda 5 mg daily as recommended by psychiatry, Dr. Garnetta Buddy.     Myrna Blazer, MD 12/22/14 [phone]

## 2014-12-22 NOTE — Discharge Instructions (Signed)
Schizophrenia °Schizophrenia is a mental illness. It may cause disturbed or disorganized thinking, speech, or behavior. People with schizophrenia have problems functioning in one or more areas of life: work, school, home, or relationships. People with schizophrenia are at increased risk for suicide, certain chronic physical illnesses, and unhealthy behaviors, such as smoking and drug use. °People who have family members with schizophrenia are at higher risk of developing the illness. Schizophrenia affects men and women equally but usually appears at an earlier age (teenage or early adult years) in men.  °SYMPTOMS °The earliest symptoms are often subtle (prodrome) and may go unnoticed until the illness becomes more severe (first-break psychosis). Symptoms of schizophrenia may be continuous or may come and go in severity. Episodes often are triggered by major life events, such as family stress, college, military service, marriage, pregnancy or child birth, divorce, or loss of a loved one. People with schizophrenia may see, hear, or feel things that do not exist (hallucinations). They may have false beliefs in spite of obvious proof to the contrary (delusions). Sometimes speech is incoherent or behavior is odd or withdrawn.  °DIAGNOSIS °Schizophrenia is diagnosed through an assessment by your caregiver. Your caregiver will ask questions about your thoughts, behavior, mood, and ability to function in daily life. Your caregiver may ask questions about your medical history and use of alcohol or drugs, including prescription medication. Your caregiver may also order blood tests and imaging exams. Certain medical conditions and substances can cause symptoms that resemble schizophrenia. Your caregiver may refer you to a mental health specialist for evaluation. There are three major criterion for a diagnosis of schizophrenia: °· Two or more of the following five symptoms are present for a month or longer: °¨ Delusions. Often  the delusions are that you are being attacked, harassed, cheated, persecuted or conspired against (persecutory delusions). °¨ Hallucinations.   °¨ Disorganized speech that does not make sense to others. °¨ Grossly disorganized (confused or unfocused) behavior or extremely overactive or underactive motor activity (catatonia). °¨ Negative symptoms such as bland or blunted emotions (flat affect), loss of will power (avolition), and withdrawal from social contacts (social isolation). °· Level of functioning in one or more major areas of life (work, school, relationships, or self-care) is markedly below the level of functioning before the onset of illness.   °· There are continuous signs of illness (either mild symptoms or decreased level of functioning) for at least 6 months or longer. °TREATMENT  °Schizophrenia is a long-term illness. It is best controlled with continuous treatment rather than treatment only when symptoms occur. The following treatments are used to manage schizophrenia: °· Medication--Medication is the most effective and important form of treatment for schizophrenia. Antipsychotic medications are usually prescribed to help manage schizophrenia. Other types of medication may be added to relieve any symptoms that may occur despite the use of antipsychotic medications. °· Counseling or talk therapy--Individual, group, or family counseling may be helpful in providing education, support, and guidance. Many people with schizophrenia also benefit from social skills and job skills (vocational) training. °A combination of medication and counseling is best for managing the disorder over time. A procedure in which electricity is applied to the brain through the scalp (electroconvulsive therapy) may be used to treat catatonic schizophrenia or schizophrenia in people who cannot take or do not respond to medication and counseling. °  °This information is not intended to replace advice given to you by your health  care provider. Make sure you discuss any questions you have with   your health care provider. °  °Document Released: 02/26/2000 Document Revised: 03/21/2014 Document Reviewed: 05/23/2012 °Elsevier Interactive Patient Education ©2016 [employer] ° °

## 2014-12-22 NOTE — ED Notes (Signed)
Pt. Noted in room resting quietly;. No complaints or concerns voiced. No distress or abnormal behavior noted. Will continue to monitor with security cameras. Q 15 minute rounds continue. 

## 2014-12-22 NOTE — BH Assessment (Signed)
Per Dr. Garnetta Buddy, patient to be discharged back to group home. Patient is psychiatrically cleared. He is also calm and cooperative. Medication recommendations made for this patient by Dr. Garnetta Buddy. Patient will be given a prescription for a 1 months supply of Namenda.   Writer contacted patient's guardian Hazle Coca) @ [address] [place], [place] #[phone] to update about disposition. Writer also contacted group home to update about patient's disposition and spoke to the [facility] [phone]. He agreed to pick patient up from the [facility] ED at 12:30pm today. Writer updated patient's nurse and EDP as well.   IVC rescinded by Dr. Garnetta Buddy.

## 2014-12-22 NOTE — ED Notes (Signed)
BEHAVIORAL HEALTH ROUNDING Patient sleeping: Yes.   Patient alert and oriented: not applicable SLEEPING Behavior appropriate: Yes.  ; If no, describe: SLEEPING Nutrition and fluids offered: No SLEEPING Toileting and hygiene offered: NoSLEEPING Sitter present: not applicable Law enforcement present: Yes ODS 

## 2014-12-22 NOTE — ED Notes (Signed)
BEHAVIORAL HEALTH ROUNDING Patient sleeping: No. Patient alert and oriented: yes Behavior appropriate: Yes.  ; If no, describe:  Nutrition and fluids offered: Yes  Toileting and hygiene offered: Yes  Sitter present: no Law enforcement present: Yes  

## 2014-12-22 NOTE — Consult Note (Signed)
[facility] Face-to-Face Psychiatry Consult Follow Up Note.     Patient Identification: Timothy [name] MRN:  [id number]   Principal Diagnosis: Undifferentiated schizophrenia ([place]) , Dementia.   Diagnosis:   Patient Active Problem List   Diagnosis Date Noted  . Undifferentiated schizophrenia ([place]) [F20.3]   . Seizures ([place]) [R56.9] 11/28/2014  . TIA (transient ischemic attack) [G45.9] 11/28/2014  . Schizophrenia ([place]) [F20.9] 11/28/2014  . Hypertension [I10] 11/28/2014  . Dyslipidemia [E78.5] 11/28/2014  . Dementia [F03.90] 11/28/2014    Total Time spent with patient: 30 minutes  Subjective:   Timothy [name] is a 81 y.o. male patient with a h/o schizophrenia, TIAs and seizures who presents to [facility] ED for paranoia and hallucinations. Patient was seen for the follow-up. He was lying in the bed and appeared somewhat still. He was able to respond to the questions appropriately. Most of the information was obtained from the patient as well as review of his records. According to the initial records, he was initially brought to the hospital as he was  paranoid and believes that the army is after him. He was found hiding. Per ED counselor note, the pt was brought in under IVC for responding to internal stimuli. At the time of that interview, the pt was oriented to date, where he lives and current location. He stated that he enjoys trees, love and good things. He was pleasant and cooperative during the interview. He appeared somewhat uptight during the interview but was able to move his arms and legs. He reported that he is going to be 81 years old soon. He also told me that he is currently in the hospital.   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 He has a h/o being combative. He was brought to [facility] under IVC by police.      Risk to Self: Suicidal Ideation: No Suicidal Intent: No Is patient at risk for suicide?: No Suicidal Plan?: No Access to Means: No What has been your use of  drugs/alcohol within the last 12 months?: NA How many times?: 0 Other Self Harm Risks: None Reportred Triggers for Past Attempts:  (NA) Intentional Self Injurious Behavior: None Risk to Others: Homicidal Ideation: No Thoughts of Harm to Others: No Comment - Thoughts of Harm to Others: NA Current Homicidal Intent: No Current Homicidal Plan: No Describe Current Homicidal Plan: Calm Access to Homicidal Means: No Describe Access to Homicidal Means: NA Identified Victim: NA History of harm to others?: No Assessment of Violence: None Noted Violent Behavior Description: None Reported Does patient have access to weapons?: No Criminal Charges Pending?: No Does patient have a court date: No Prior Inpatient Therapy: Prior Inpatient Therapy: No Prior Therapy Dates: na Prior Therapy Facilty/Provider(s): na Reason for Treatment: na Prior Outpatient Therapy: Prior Outpatient Therapy: Yes Prior Therapy Dates: Ongiong  Prior Therapy Facilty/Provider(s): Unable to remember  Reason for Treatment: Unable to remember  Does patient have an ACCT team?: No Does patient have Intensive In-House Services?  : No Does patient have [facility] services? : No Does patient have P4CC services?: No  Past Medical History:  Past Medical History  Diagnosis Date  . Seizures ([place])   . TIA (transient ischemic attack)   . Schizophrenia ([place])    History reviewed. No pertinent past surgical history. Family History: History reviewed. No pertinent family history.     Social History:  History  Alcohol Use No     History  Drug Use No    Social History  Social History  . Marital Status: Widowed    Spouse Name: N/A  . Number of Children: N/A  . Years of Education: N/A   Social History Main Topics  . Smoking status: Current Every Day Smoker -- 5.00 packs/day for 25 years    Types: Cigarettes  . Smokeless tobacco: None  . Alcohol Use: No  . Drug Use: No  . Sexual Activity: Not Currently   Other Topics  Concern  . None   Social History Narrative   Additional Social History:    History of alcohol / drug use?: No history of alcohol / drug abuse  Lives in a group home.  Served in the [employer] from 1955-59 but denies combat.  [facility] connected: Yes  Marital status: Not married Children: 4 children Religion: Unknown  Allergies:  No Known Allergies  Labs:  Results for orders placed or performed during the hospital encounter of 12/21/14 (from the past 48 hour(s))  CBC with Differential     Status: Abnormal   Collection Time: 12/21/14  1:09 AM  Result Value Ref Range   WBC 3.7 (L) 3.8 - 10.6 K/uL   RBC 5.22 4.40 - 5.90 MIL/uL   Hemoglobin 13.8 13.0 - 18.0 g/dL   HCT 42.2 40.0 - 52.0 %   MCV 80.8 80.0 - 100.0 fL   MCH 26.5 26.0 - 34.0 pg   MCHC 32.8 32.0 - 36.0 g/dL   RDW 14.5 11.5 - 14.5 %   Platelets 213 150 - 440 K/uL   Neutrophils Relative % 54 %   Neutro Abs 2.0 1.4 - 6.5 K/uL   Lymphocytes Relative 31 %   Lymphs Abs 1.2 1.0 - 3.6 K/uL   Monocytes Relative 12 %   Monocytes Absolute 0.4 0.2 - 1.0 K/uL   Eosinophils Relative 2 %   Eosinophils Absolute 0.1 0 - 0.7 K/uL   Basophils Relative 1 %   Basophils Absolute 0.0 0 - 0.1 K/uL  Comprehensive metabolic panel     Status: None   Collection Time: 12/21/14  1:09 AM  Result Value Ref Range   Sodium 142 135 - 145 mmol/L   Potassium 4.0 3.5 - 5.1 mmol/L   Chloride 107 101 - 111 mmol/L   CO2 29 22 - 32 mmol/L   Glucose, Bld 77 65 - 99 mg/dL   BUN 15 6 - 20 mg/dL   Creatinine, Ser 1.05 0.61 - 1.24 mg/dL   Calcium 9.7 8.9 - 10.3 mg/dL   Total Protein 8.0 6.5 - 8.1 g/dL   Albumin 4.1 3.5 - 5.0 g/dL   AST 36 15 - 41 U/L   ALT 22 17 - 63 U/L   Alkaline Phosphatase 88 38 - 126 U/L   Total Bilirubin 0.7 0.3 - 1.2 mg/dL   GFR calc non Af Amer >60 >60 mL/min   GFR calc Af Amer >60 >60 mL/min    Comment: (NOTE) The eGFR has been calculated using the CKD EPI equation. This calculation has not been validated in all clinical  situations. eGFR's persistently <60 mL/min signify possible Chronic Kidney Disease.    Anion gap 6 5 - 15  Acetaminophen level     Status: Abnormal   Collection Time: 12/21/14  1:09 AM  Result Value Ref Range   Acetaminophen (Tylenol), Serum <10 (L) 10 - 30 ug/mL    Comment:        THERAPEUTIC CONCENTRATIONS VARY SIGNIFICANTLY. A RANGE OF 10-30 ug/mL MAY BE AN EFFECTIVE CONCENTRATION FOR MANY PATIENTS. HOWEVER, SOME  ARE BEST TREATED AT CONCENTRATIONS OUTSIDE THIS RANGE. ACETAMINOPHEN CONCENTRATIONS >150 ug/mL AT 4 HOURS AFTER INGESTION AND >50 ug/mL AT 12 HOURS AFTER INGESTION ARE OFTEN ASSOCIATED WITH TOXIC REACTIONS.   Ethanol     Status: None   Collection Time: 12/21/14  1:09 AM  Result Value Ref Range   Alcohol, Ethyl (B) <5 <5 mg/dL    Comment:        LOWEST DETECTABLE LIMIT FOR SERUM ALCOHOL IS 5 mg/dL FOR MEDICAL PURPOSES ONLY   Salicylate level     Status: None   Collection Time: 12/21/14  1:09 AM  Result Value Ref Range   Salicylate Lvl <4.0 2.8 - 30.0 mg/dL  Troponin I     Status: None   Collection Time: 12/21/14  1:09 AM  Result Value Ref Range   Troponin I <0.03 <0.031 ng/mL    Comment:        NO INDICATION OF MYOCARDIAL INJURY.   Urinalysis complete, with microscopic ([facility] only)     Status: Abnormal   Collection Time: 12/22/14  1:09 AM  Result Value Ref Range   Color, Urine YELLOW (A) YELLOW   APPearance CLEAR (A) CLEAR   Glucose, UA NEGATIVE NEGATIVE mg/dL   Bilirubin Urine NEGATIVE NEGATIVE   Ketones, ur NEGATIVE NEGATIVE mg/dL   Specific Gravity, Urine 1.017 1.005 - 1.030   Hgb urine dipstick NEGATIVE NEGATIVE   pH 5.0 5.0 - 8.0   Protein, ur NEGATIVE NEGATIVE mg/dL   Nitrite NEGATIVE NEGATIVE   Leukocytes, UA NEGATIVE NEGATIVE   RBC / HPF 0-5 0 - 5 RBC/hpf   WBC, UA 0-5 0 - 5 WBC/hpf   Bacteria, UA NONE SEEN NONE SEEN   Squamous Epithelial / LPF 0-5 (A) NONE SEEN   Mucous PRESENT   Urine Drug Screen, Qualitative ([facility] only)     Status:  None   Collection Time: 12/22/14  1:09 AM  Result Value Ref Range   Tricyclic, Ur Screen NONE DETECTED NONE DETECTED   Amphetamines, Ur Screen NONE DETECTED NONE DETECTED   MDMA (Ecstasy)Ur Screen NONE DETECTED NONE DETECTED   Cocaine Metabolite,Ur [place] NONE DETECTED NONE DETECTED   Opiate, Ur Screen NONE DETECTED NONE DETECTED   Phencyclidine (PCP) Ur S NONE DETECTED NONE DETECTED   Cannabinoid 50 Ng, Ur [place] NONE DETECTED NONE DETECTED   Barbiturates, Ur Screen NONE DETECTED NONE DETECTED   Benzodiazepine, Ur Scrn NONE DETECTED NONE DETECTED   Methadone Scn, Ur NONE DETECTED NONE DETECTED    Comment: (NOTE) 100  Tricyclics, urine               Cutoff 1000 ng/mL 200  Amphetamines, urine             Cutoff 1000 ng/mL 300  MDMA (Ecstasy), urine           Cutoff 500 ng/mL 400  Cocaine Metabolite, urine       Cutoff 300 ng/mL 500  Opiate, urine                   Cutoff 300 ng/mL 600  Phencyclidine (PCP), urine      Cutoff 25 ng/mL 700  Cannabinoid, urine              Cutoff 50 ng/mL 800  Barbiturates, urine             Cutoff 200 ng/mL 900  Benzodiazepine, urine           Cutoff 200 ng/mL 1000 Methadone, urine  Cutoff 300 ng/mL 1100 1200 The urine drug screen provides only a preliminary, unconfirmed 1300 analytical test result and should not be used for non-medical 1400 purposes. Clinical consideration and professional judgment should 1500 be applied to any positive drug screen result due to possible 1600 interfering substances. A more specific alternate chemical method 1700 must be used in order to obtain a confirmed analytical result.  1800 Gas chromato graphy / mass spectrometry (GC/MS) is the preferred 1900 confirmatory method.     Current Facility-Administered Medications  Medication Dose Route Frequency Provider Last Rate Last Dose  . amLODipine (NORVASC) tablet 5 mg  5 mg Oral Daily Donita Brooks, MD   5 mg at 12/22/14 0953  . atorvastatin (LIPITOR) tablet 10 mg   10 mg Oral q1800 Donita Brooks, MD      . diphenhydrAMINE (BENADRYL) capsule 50 mg  50 mg Oral Q8H PRN Donita Brooks, MD   50 mg at 12/21/14 1726   Or  . diphenhydrAMINE (BENADRYL) injection 50 mg  50 mg Intramuscular Q8H PRN Donita Brooks, MD      . levETIRAcetam (KEPPRA) tablet 1,500 mg  1,500 mg Oral BID Donita Brooks, MD   1,500 mg at 12/22/14 0954  . memantine (NAMENDA) tablet 5 mg  5 mg Oral Daily Rainey Pines, MD      . OLANZapine (ZYPREXA) tablet 10 mg  10 mg Oral QHS Donita Brooks, MD      . OLANZapine [facility]) tablet 10 mg  10 mg Oral QHS Donita Brooks, MD   10 mg at 12/21/14 1552  . potassium chloride SA (K-DUR,KLOR-CON) CR tablet 20 mEq  20 mEq Oral Daily Donita Brooks, MD   20 mEq at 12/22/14 0953  . vitamin B-12 (CYANOCOBALAMIN) tablet 1,000 mcg  1,000 mcg Oral Daily Donita Brooks, MD   1,000 mcg at 12/22/14 1003   Current Outpatient Prescriptions  Medication Sig Dispense Refill  . amLODipine (NORVASC) 10 MG tablet Take 10 mg by mouth daily.    Marland Kitchen atorvastatin (LIPITOR) 20 MG tablet Take 10 mg by mouth at bedtime.    . lamoTRIgine (LAMICTAL) 25 MG tablet Take 50 mg by mouth 2 (two) times daily. Take 2 tabs twice a day for 2 weeks, then 3 tabs twice a day for two weeks, then 4 tabs twice a day then on.    . levETIRAcetam (KEPPRA) 750 MG tablet Take 1,500 mg by mouth 2 (two) times daily.  0  . OLANZapine (ZYPREXA) 10 MG tablet Take 10 mg by mouth at bedtime.    . potassium chloride SA (K-DUR,KLOR-CON) 20 MEQ tablet Take 20 mEq by mouth once.    . vitamin B-12 (CYANOCOBALAMIN) 1000 MCG tablet Take 1,000 mcg by mouth daily.      Musculoskeletal: Strength & Muscle Tone: within normal limits Gait & Station: not assessed Patient leans: N/A  Psychiatric Specialty Exam: Review of Systems  Constitutional: Negative.   HENT: Negative.   Eyes: Negative.   Respiratory: Negative.   Cardiovascular: Negative.   Gastrointestinal: Negative.   Genitourinary: Negative.    Musculoskeletal: Negative.   Skin: Negative.   Neurological: Negative.   Endo/Heme/Allergies: Negative.   Psychiatric/Behavioral: Positive for depression and hallucinations. Negative for suicidal ideas, memory loss and substance abuse. The patient is not nervous/anxious and does not have insomnia.   All other systems reviewed and are negative.   Blood pressure 137/77, pulse 61, temperature 99.2 F (37.3 C), temperature source Oral, resp. rate  20, height 5' 10.5" (1.791 m), weight 160 lb (72.576 kg), SpO2 97 %.Body mass index is 22.63 kg/(m^2).  General Appearance: Casual and Fairly Groomed  Eye Contact::  Good  Speech:  Slow  Volume:  Normal  Mood:  Euthymic  Affect:  Appropriate  Thought Process:  Coherent, Goal Directed, Intact, Linear and Logical  Orientation:  Other:  alert, oriented  to self and place.   Thought Content:  Negative  Suicidal Thoughts:  No  Homicidal Thoughts:  No  Memory:  Immediate;   Good Recent;   Good Remote;   Good  Judgement:  Fair  Insight:  Fair  Psychomotor Activity:  Normal  Concentration:  Good  Recall:  Good  Fund of Knowledge:Good  Language: Fair  Akathisia:  Negative  Handed:  Right  AIMS (if indicated):     Assets:  Communication Skills Desire for Improvement Housing Resilience Social Support Talents/Skills Vocational/Educational  ADL's:  Intact  Cognition: WNL  Sleep:      Treatment Plan Summary: Daily contact with patient to assess and evaluate symptoms and progress in treatment, Medication management and Plan restart home medications  Disposition: No evidence of imminent risk to self or others at present.   Supportive therapy provided about ongoing stressors. Started on Namenda $RemoveBe'5mg'MCfcorbsh$  for Memory Issues.   Continue Zyprexa $RemoveBeforeDE'10mg'WeUIwSRCXzIGNlM$  po qhs      This note was generated in part or whole with voice recognition software. Voice regonition is usually quite accurate but there are transcription errors that can and very often do occur. I  apologize for any typographical errors that were not detected and corrected.   Rainey Pines 12/22/2014 10:41 AM

## 2014-12-22 NOTE — ED Notes (Signed)
Dr Garnetta Buddy in with pt

## 2014-12-22 NOTE — ED Provider Notes (Signed)
-----------------------------------------   7:48 AM on 12/22/2014 -----------------------------------------   Blood pressure 136/72, pulse 78, temperature 98 F (36.7 C), temperature source Oral, resp. rate 18, height 5' 10.5" (1.791 m), weight 160 lb (72.576 kg), SpO2 100 %.  The patient had no acute events since last update.  Calm and cooperative at this time.  Disposition is pending per Psychiatry/Behavioral Medicine team recommendations.     Myrna Blazer, MD 12/22/14 [phone]

## 2014-12-22 NOTE — ED Notes (Signed)
BEHAVIORAL HEALTH ROUNDING Patient sleeping: No. Patient alert and oriented: no, confused Behavior appropriate: No.; If no, describe: Pt walking around knocking on other pt's doors.  Nutrition and fluids offered: Yes  Toileting and hygiene offered: Yes  Sitter present: yes Law enforcement present: Yes   ENVIRONMENTAL ASSESSMENT Potentially harmful objects out of patient reach: Yes.   Personal belongings secured: Yes.   Patient dressed in hospital provided attire only: Yes.   Plastic bags out of patient reach: Yes.   Patient care equipment (cords, cables, call bells, lines, and drains) shortened, removed, or accounted for: Yes.   Equipment and supplies removed from bottom of stretcher: Yes.   Potentially toxic materials out of patient reach: Yes.   Sharps container removed or out of patient reach: Yes.

## 2014-12-29 DIAGNOSIS — E876 Hypokalemia: Secondary | ICD-10-CM | POA: Diagnosis not present

## 2014-12-29 DIAGNOSIS — R569 Unspecified convulsions: Secondary | ICD-10-CM | POA: Diagnosis not present

## 2014-12-29 DIAGNOSIS — E7800 Pure hypercholesterolemia, unspecified: Secondary | ICD-10-CM | POA: Diagnosis not present

## 2014-12-29 DIAGNOSIS — I10 Essential (primary) hypertension: Secondary | ICD-10-CM | POA: Diagnosis not present

## 2015-03-30 DIAGNOSIS — E876 Hypokalemia: Secondary | ICD-10-CM | POA: Diagnosis not present

## 2015-03-30 DIAGNOSIS — F209 Schizophrenia, unspecified: Secondary | ICD-10-CM | POA: Diagnosis not present

## 2015-03-30 DIAGNOSIS — E7800 Pure hypercholesterolemia, unspecified: Secondary | ICD-10-CM | POA: Diagnosis not present

## 2015-03-30 DIAGNOSIS — I10 Essential (primary) hypertension: Secondary | ICD-10-CM | POA: Diagnosis not present

## 2015-05-05 DIAGNOSIS — F209 Schizophrenia, unspecified: Secondary | ICD-10-CM | POA: Diagnosis not present

## 2015-05-05 DIAGNOSIS — E7800 Pure hypercholesterolemia, unspecified: Secondary | ICD-10-CM | POA: Diagnosis not present

## 2015-05-05 DIAGNOSIS — E876 Hypokalemia: Secondary | ICD-10-CM | POA: Diagnosis not present

## 2015-05-05 DIAGNOSIS — I10 Essential (primary) hypertension: Secondary | ICD-10-CM | POA: Diagnosis not present

## 2015-06-09 ENCOUNTER — Ambulatory Visit (INDEPENDENT_AMBULATORY_CARE_PROVIDER_SITE_OTHER): Payer: Medicare Other | Admitting: Family Medicine

## 2015-06-09 ENCOUNTER — Encounter: Payer: Self-pay | Admitting: Family Medicine

## 2015-06-09 VITALS — BP 118/76 | HR 80 | Ht 70.0 in | Wt 163.0 lb

## 2015-06-09 DIAGNOSIS — F0390 Unspecified dementia without behavioral disturbance: Secondary | ICD-10-CM | POA: Diagnosis not present

## 2015-06-09 DIAGNOSIS — E559 Vitamin D deficiency, unspecified: Secondary | ICD-10-CM

## 2015-06-09 DIAGNOSIS — I679 Cerebrovascular disease, unspecified: Secondary | ICD-10-CM | POA: Diagnosis not present

## 2015-06-09 DIAGNOSIS — M1990 Unspecified osteoarthritis, unspecified site: Secondary | ICD-10-CM | POA: Diagnosis not present

## 2015-06-09 DIAGNOSIS — I10 Essential (primary) hypertension: Secondary | ICD-10-CM

## 2015-06-09 DIAGNOSIS — E876 Hypokalemia: Secondary | ICD-10-CM

## 2015-06-09 DIAGNOSIS — E785 Hyperlipidemia, unspecified: Secondary | ICD-10-CM | POA: Diagnosis not present

## 2015-06-09 DIAGNOSIS — G40909 Epilepsy, unspecified, not intractable, without status epilepticus: Secondary | ICD-10-CM

## 2015-06-09 DIAGNOSIS — Z7189 Other specified counseling: Secondary | ICD-10-CM

## 2015-06-09 DIAGNOSIS — Z7689 Persons encountering health services in other specified circumstances: Secondary | ICD-10-CM

## 2015-06-09 DIAGNOSIS — E538 Deficiency of other specified B group vitamins: Secondary | ICD-10-CM | POA: Diagnosis not present

## 2015-06-09 MED ORDER — MEMANTINE HCL 5 MG PO TABS: 5.0000 mg | ORAL_TABLET | Freq: Every day | ORAL | Status: DC

## 2015-06-09 MED ORDER — AMLODIPINE BESYLATE 10 MG PO TABS: 10.0000 mg | ORAL_TABLET | Freq: Every day | ORAL | Status: DC

## 2015-06-09 MED ORDER — LEVETIRACETAM 750 MG PO TABS: 1500.0000 mg | ORAL_TABLET | Freq: Two times a day (BID) | ORAL | Status: DC

## 2015-06-09 MED ORDER — POTASSIUM CHLORIDE CRYS ER 20 MEQ PO TBCR: 20.0000 meq | EXTENDED_RELEASE_TABLET | Freq: Once | ORAL | Status: DC

## 2015-06-09 MED ORDER — ACETAMINOPHEN 325 MG PO TABS: 650.0000 mg | ORAL_TABLET | Freq: Two times a day (BID) | ORAL | Status: DC

## 2015-06-09 MED ORDER — ATORVASTATIN CALCIUM 20 MG PO TABS: 10.0000 mg | ORAL_TABLET | Freq: Every day | ORAL | Status: DC

## 2015-06-09 MED ORDER — VITAMIN B-12 1000 MCG PO TABS: 1000.0000 ug | ORAL_TABLET | Freq: Every day | ORAL | Status: DC

## 2015-06-09 MED ORDER — ERGOCALCIFEROL 1.25 MG (50000 UT) PO CAPS: 50000.0000 [IU] | ORAL_CAPSULE | ORAL | Status: DC

## 2015-06-09 NOTE — Progress Notes (Signed)
Name: Timothy [name]   MRN: [id number]    DOB: Feb 09, 1933   Date:06/09/2015       Progress Note  Subjective  Chief Complaint  Chief Complaint  Patient presents with  . Hypertension  . Hyperlipidemia  . Dementia    takes Namenda  . hypokalemia  . vitamin b12 deficiency  . vitamin d deficiency    Hypertension This is a chronic problem. The current episode started more than 1 year ago. The problem is controlled. Pertinent negatives include no anxiety, blurred vision, chest pain, headaches, malaise/fatigue, neck pain, orthopnea, palpitations, peripheral edema, PND, shortness of breath or sweats. There are no associated agents to hypertension. There are no known risk factors for coronary artery disease. Past treatments include calcium channel blockers. The current treatment provides mild improvement. There are no compliance problems.  There is no history of angina, kidney disease, CAD/MI, CVA, heart failure, left ventricular hypertrophy, PVD, renovascular disease or retinopathy. There is no history of chronic renal disease or a hypertension causing med.  Hyperlipidemia This is a chronic problem. The problem is controlled. He has no history of chronic renal disease, diabetes, hypothyroidism, liver disease, obesity or nephrotic syndrome. There are no known factors aggravating his hyperlipidemia. Pertinent negatives include no chest pain, focal weakness, leg pain, myalgias or shortness of breath. Current antihyperlipidemic treatment includes statins. The current treatment provides significant improvement of lipids. There are no compliance problems.  There are no known risk factors for coronary artery disease.  Anemia Presents for follow-up visit. There has been no abdominal pain, anorexia, bruising/bleeding easily, confusion, fever, leg swelling, light-headedness, malaise/fatigue, palpitations, paresthesias or weight loss. Signs of blood loss that are not present include melena. Past treatments include  nothing. There is no history of chronic renal disease, heart failure or hypothyroidism. There are no compliance problems.     No problem-specific assessment & plan notes found for this encounter.   Past Medical History  Diagnosis Date  . Seizures ([facility])   . TIA (transient ischemic attack)   . Schizophrenia ([facility])     History reviewed. No pertinent past surgical history.  History reviewed. No pertinent family history.  Social History   Social History  . Marital Status: Widowed    Spouse Name: N/A  . Number of Children: N/A  . Years of Education: N/A   Occupational History  . Not on file.   Social History Main Topics  . Smoking status: Current Every Day Smoker -- 5.00 packs/day for 25 years    Types: Cigarettes  . Smokeless tobacco: Not on file  . Alcohol Use: No  . Drug Use: No  . Sexual Activity: Not Currently   Other Topics Concern  . Not on file   Social History Narrative    No Known Allergies   Review of Systems  Constitutional: Negative for fever, chills, weight loss and malaise/fatigue.  HENT: Negative for ear discharge, ear pain and sore throat.   Eyes: Negative for blurred vision.  Respiratory: Negative for cough, sputum production, shortness of breath and wheezing.   Cardiovascular: Negative for chest pain, palpitations, orthopnea, leg swelling and PND.  Gastrointestinal: Negative for heartburn, nausea, abdominal pain, diarrhea, constipation, blood in stool, melena and anorexia.  Genitourinary: Negative for dysuria, urgency, frequency and hematuria.  Musculoskeletal: Negative for myalgias, back pain, joint pain and neck pain.  Skin: Negative for rash.  Neurological: Negative for dizziness, tingling, sensory change, focal weakness, light-headedness, headaches and paresthesias.  Endo/Heme/Allergies: Negative for environmental allergies and polydipsia. Does not  bruise/bleed easily.  Psychiatric/Behavioral: Negative for depression, suicidal ideas and  confusion. The patient is not nervous/anxious and does not have insomnia.      Objective  Filed Vitals:   06/09/15 1440  BP: 118/76  Pulse: 80  Height: 5\' 10"  (1.778 m)  Weight: 163 lb (73.936 kg)    Physical Exam  Constitutional: He is oriented to person, place, and time and well-developed, well-nourished, and in no distress.  HENT:  Head: Normocephalic.  Right Ear: External ear normal.  Left Ear: External ear normal.  Nose: Nose normal.  Mouth/Throat: Oropharynx is clear and moist.  Eyes: Conjunctivae and EOM are normal. Pupils are equal, round, and reactive to light. Right eye exhibits no discharge. Left eye exhibits no discharge. No scleral icterus.  Neck: Normal range of motion. Neck supple. No JVD present. No tracheal deviation present. No thyromegaly present.  Cardiovascular: Normal rate, regular rhythm, normal heart sounds and intact distal pulses.  Exam reveals no gallop and no friction rub.   No murmur heard. Pulmonary/Chest: Breath sounds normal. No respiratory distress. He has no wheezes. He has no rales.  Abdominal: Soft. Bowel sounds are normal. He exhibits no mass. There is no hepatosplenomegaly. There is no tenderness. There is no rebound, no guarding and no CVA tenderness.  Musculoskeletal: Normal range of motion. He exhibits no edema or tenderness.  Lymphadenopathy:    He has no cervical adenopathy.  Neurological: He is alert and oriented to person, place, and time. He has normal sensation, normal strength and intact cranial nerves. No cranial nerve deficit.  Skin: Skin is warm. No rash noted.  Psychiatric: Mood and affect normal.      Assessment & Plan  Problem List Items Addressed This Visit      Cardiovascular and Mediastinum   Hypertension   Relevant Medications   atorvastatin (LIPITOR) 20 MG tablet   amLODipine (NORVASC) 10 MG tablet   Other Relevant Orders   Renal Function Panel   Cerebral vascular disease   Relevant Medications    atorvastatin (LIPITOR) 20 MG tablet   amLODipine (NORVASC) 10 MG tablet     Digestive   B12 deficiency   Relevant Medications   vitamin B-12 (CYANOCOBALAMIN) 1000 MCG tablet   Other Relevant Orders   Hemoglobin     Nervous and Auditory   Dementia   Relevant Medications   memantine (NAMENDA) 5 MG tablet   levETIRAcetam (KEPPRA) 750 MG tablet     Other   Dyslipidemia   Relevant Medications   atorvastatin (LIPITOR) 20 MG tablet   Other Relevant Orders   Lipid Profile   Hypokalemia   Relevant Medications   potassium chloride SA (K-DUR,KLOR-CON) 20 MEQ tablet   Other Relevant Orders   Renal Function Panel    Other Visit Diagnoses    Encounter to establish care with new doctor    -  Primary    Osteoarthritis, unspecified osteoarthritis type, unspecified site        Relevant Medications    acetaminophen (TYLENOL) 325 MG tablet    Seizure disorder (HCC)        Relevant Medications    levETIRAcetam (KEPPRA) 750 MG tablet    Vitamin D deficiency        Relevant Medications    ergocalciferol (VITAMIN D2) 50000 units capsule         Dr. Hayden Rasmusseneanna Jones [facility]  06/09/2015

## 2015-06-10 LAB — LIPID PANEL
Chol/HDL Ratio: 1.7 ratio units (ref 0.0–5.0)
Cholesterol, Total: 136 mg/dL (ref 100–199)
HDL: 81 mg/dL (ref >39)
LDL Calculated: 44 mg/dL (ref 0–99)
Triglycerides: 53 mg/dL (ref 0–149)
VLDL Cholesterol Cal: 11 mg/dL (ref 5–40)

## 2015-06-10 LAB — RENAL FUNCTION PANEL
ALBUMIN: 4.3 g/dL (ref 3.5–4.7)
BUN/Creatinine Ratio: 17 (ref 10–22)
BUN: 18 mg/dL (ref 8–27)
CHLORIDE: 101 mmol/L (ref 96–106)
CO2: 30 mmol/L — AB (ref 18–29)
Calcium: 10.3 mg/dL — ABNORMAL HIGH (ref 8.6–10.2)
Creatinine, Ser: 1.04 mg/dL (ref 0.76–1.27)
GFR calc non Af Amer: 67 mL/min/{1.73_m2} (ref >59)
GFR, EST AFRICAN AMERICAN: 78 mL/min/{1.73_m2} (ref >59)
GLUCOSE: 102 mg/dL — AB (ref 65–99)
PHOSPHORUS: 3.2 mg/dL (ref 2.5–4.5)
POTASSIUM: 5.1 mmol/L (ref 3.5–5.2)
SODIUM: 144 mmol/L (ref 134–144)

## 2015-06-10 LAB — HEMOGLOBIN: Hemoglobin: 13.7 g/dL (ref 12.6–17.7)

## 2015-06-11 ENCOUNTER — Other Ambulatory Visit: Payer: Self-pay

## 2015-06-26 ENCOUNTER — Other Ambulatory Visit: Payer: Self-pay | Admitting: Family Medicine

## 2015-08-15 ENCOUNTER — Encounter: Payer: Self-pay | Admitting: Emergency Medicine

## 2015-08-15 ENCOUNTER — Emergency Department: Payer: Medicare Other

## 2015-08-15 ENCOUNTER — Emergency Department
Admission: EM | Admit: 2015-08-15 | Discharge: 2015-08-15 | Disposition: A | Discharge status: Home / Self Care | Payer: Medicare Other | Attending: Student | Admitting: Student

## 2015-08-15 DIAGNOSIS — Z79899 Other long term (current) drug therapy: Secondary | ICD-10-CM | POA: Diagnosis not present

## 2015-08-15 DIAGNOSIS — S299XXA Unspecified injury of thorax, initial encounter: Secondary | ICD-10-CM | POA: Diagnosis not present

## 2015-08-15 DIAGNOSIS — Y9389 Activity, other specified: Secondary | ICD-10-CM | POA: Diagnosis not present

## 2015-08-15 DIAGNOSIS — F17210 Nicotine dependence, cigarettes, uncomplicated: Secondary | ICD-10-CM | POA: Diagnosis not present

## 2015-08-15 DIAGNOSIS — F203 Undifferentiated schizophrenia: Secondary | ICD-10-CM | POA: Diagnosis not present

## 2015-08-15 DIAGNOSIS — I10 Essential (primary) hypertension: Secondary | ICD-10-CM | POA: Insufficient documentation

## 2015-08-15 DIAGNOSIS — Z8669 Personal history of other diseases of the nervous system and sense organs: Secondary | ICD-10-CM | POA: Insufficient documentation

## 2015-08-15 DIAGNOSIS — S0990XA Unspecified injury of head, initial encounter: Secondary | ICD-10-CM | POA: Diagnosis not present

## 2015-08-15 DIAGNOSIS — R4182 Altered mental status, unspecified: Secondary | ICD-10-CM | POA: Diagnosis not present

## 2015-08-15 DIAGNOSIS — Z7401 Bed confinement status: Secondary | ICD-10-CM | POA: Diagnosis not present

## 2015-08-15 DIAGNOSIS — E785 Hyperlipidemia, unspecified: Secondary | ICD-10-CM | POA: Insufficient documentation

## 2015-08-15 DIAGNOSIS — Y999 Unspecified external cause status: Secondary | ICD-10-CM | POA: Insufficient documentation

## 2015-08-15 DIAGNOSIS — W1839XA Other fall on same level, initial encounter: Secondary | ICD-10-CM | POA: Diagnosis not present

## 2015-08-15 DIAGNOSIS — Y9289 Other specified places as the place of occurrence of the external cause: Secondary | ICD-10-CM | POA: Diagnosis not present

## 2015-08-15 DIAGNOSIS — Z8673 Personal history of transient ischemic attack (TIA), and cerebral infarction without residual deficits: Secondary | ICD-10-CM | POA: Insufficient documentation

## 2015-08-15 DIAGNOSIS — W19XXXA Unspecified fall, initial encounter: Secondary | ICD-10-CM

## 2015-08-15 DIAGNOSIS — R404 Transient alteration of awareness: Secondary | ICD-10-CM | POA: Diagnosis not present

## 2015-08-15 LAB — COMPREHENSIVE METABOLIC PANEL
ALBUMIN: 4 g/dL (ref 3.5–5.0)
ALK PHOS: 71 U/L (ref 38–126)
ALT: 39 U/L (ref 17–63)
ANION GAP: 8 (ref 5–15)
AST: 113 U/L — ABNORMAL HIGH (ref 15–41)
BILIRUBIN TOTAL: 0.5 mg/dL (ref 0.3–1.2)
BUN: 10 mg/dL (ref 6–20)
CALCIUM: 9.3 mg/dL (ref 8.9–10.3)
CO2: 29 mmol/L (ref 22–32)
Chloride: 105 mmol/L (ref 101–111)
Creatinine, Ser: 0.97 mg/dL (ref 0.61–1.24)
GFR calc non Af Amer: >60 mL/min (ref >60)
GLUCOSE: 74 mg/dL (ref 65–99)
Potassium: 3.4 mmol/L — ABNORMAL LOW (ref 3.5–5.1)
Sodium: 142 mmol/L (ref 135–145)
TOTAL PROTEIN: 7.3 g/dL (ref 6.5–8.1)

## 2015-08-15 LAB — CBC WITH DIFFERENTIAL/PLATELET
Basophils Absolute: 0 10*3/uL (ref 0–0.1)
Basophils Relative: 1 %
Eosinophils Absolute: 0 10*3/uL (ref 0–0.7)
Eosinophils Relative: 1 %
HEMATOCRIT: 42.3 % (ref 40.0–52.0)
HEMOGLOBIN: 14.1 g/dL (ref 13.0–18.0)
LYMPHS ABS: 0.7 10*3/uL — AB (ref 1.0–3.6)
LYMPHS PCT: 20 %
MCH: 27.4 pg (ref 26.0–34.0)
MCHC: 33.3 g/dL (ref 32.0–36.0)
MCV: 82.2 fL (ref 80.0–100.0)
MONOS PCT: 13 %
Monocytes Absolute: 0.4 10*3/uL (ref 0.2–1.0)
NEUTROS ABS: 2.3 10*3/uL (ref 1.4–6.5)
NEUTROS PCT: 65 %
Platelets: 147 10*3/uL — ABNORMAL LOW (ref 150–440)
RBC: 5.15 MIL/uL (ref 4.40–5.90)
RDW: 14.5 % (ref 11.5–14.5)
WBC: 3.5 10*3/uL — ABNORMAL LOW (ref 3.8–10.6)

## 2015-08-15 LAB — URINALYSIS COMPLETE WITH MICROSCOPIC (ARMC ONLY)
Bilirubin Urine: NEGATIVE
Glucose, UA: NEGATIVE mg/dL
Hgb urine dipstick: NEGATIVE
Ketones, ur: NEGATIVE mg/dL
Leukocytes, UA: NEGATIVE
Nitrite: NEGATIVE
Protein, ur: NEGATIVE mg/dL
Specific Gravity, Urine: 1.016 (ref 1.005–1.030)
pH: 7 (ref 5.0–8.0)

## 2015-08-15 LAB — TROPONIN I: Troponin I: <0.03 ng/mL (ref <0.031)

## 2015-08-15 MED ORDER — SODIUM CHLORIDE 0.9 % IV BOLUS (SEPSIS)
1000.0000 mL | Freq: Once | INTRAVENOUS | Status: AC
  Administered 2015-08-15: 1000 mL via INTRAVENOUS

## 2015-08-15 NOTE — ED Notes (Signed)
MD Inocencio HomesGayle at bedside. Pt given water to drink and speech improved significantly.

## 2015-08-15 NOTE — ED Notes (Signed)
Patient transported to X-ray 

## 2015-08-15 NOTE — ED Provider Notes (Addendum)
[facility] Emergency Department Provider Note   ____________________________________________  Time seen: Approximately 1:54 PM  I have reviewed the triage vital signs and the nursing notes.   HISTORY  Chief Complaint Altered Mental Status    HPI Timothy [name] is a 82 y.o. male with history of schizophrenia and seizures, hypertension and hyperlipidemia who presents for evaluation after a fall that occurred suddenly just prior to arrival, sudden onset, resolved, no modifying factors. Patient reports to me that [facility] was helping him stand up and he fell, he did hit his head but he is not complaining of any headache. Staff at the [facility] group home confirm this story. He did not lose consciousness. Patient denies any pain complaints. His caretakers at [facility] report that he was "a little weak and weary yesterday" but denies any recent noted illness and the patient. He denies any chest pain, difficulty breathing, coughing, vomiting, diarrhea, fevers or chills.   Past Medical History  Diagnosis Date  . Seizures (HCC)   . TIA (transient ischemic attack)   . Schizophrenia [facility])     Patient Active Problem List   Diagnosis Date Noted  . Cerebral vascular disease 06/09/2015  . B12 deficiency 06/09/2015  . Hypokalemia 06/09/2015  . 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 Seizures (HCC) 11/28/2014  . TIA (transient ischemic attack) 11/28/2014  . Schizophrenia (HCC) 11/28/2014  . Hypertension 11/28/2014  . Dyslipidemia 11/28/2014  . Dementia 11/28/2014    History reviewed. No pertinent past surgical history.  Current Outpatient Rx  Name  Route  Sig  Dispense  Refill  . acetaminophen (TYLENOL) 325 MG tablet   Oral   Take 2 tablets (650 mg total) by mouth 2 (two) times daily.   60 tablet   11   . amLODipine (NORVASC) 10 MG tablet   Oral   Take 1 tablet (10 mg total) by mouth daily.   30 tablet   5   . atorvastatin  (LIPITOR) 20 MG tablet   Oral   Take 0.5 tablets (10 mg total) by mouth at bedtime.   30 tablet   5   . ergocalciferol (VITAMIN D2) 50000 units capsule   Oral   Take 1 capsule (50,000 Units total) by mouth once a week.   4 capsule   5   . lamoTRIgine (LAMICTAL) 25 MG tablet   Oral   Take 50 mg by mouth 2 (two) times daily. Take 2 tabs twice a day for 2 weeks, then 3 tabs twice a day for two weeks, then 4 tabs twice a day then on.- taking  twice a day         . levETIRAcetam (KEPPRA) 750 MG tablet   Oral   Take 2 tablets (1,500 mg total) by mouth 2 (two) times daily.   56 tablet   0     Waiting on mail delivery   . memantine (NAMENDA) 5 MG tablet   Oral   Take 1 tablet (5 mg total) by mouth daily.   30 tablet   0   . OLANZapine (ZYPREXA) 10 MG tablet   Oral   Take 10 mg by mouth at bedtime.         . potassium chloride SA (K-DUR,KLOR-CON) 20 MEQ tablet   Oral   Take 1 tablet (20 mEq total) by mouth once.   30 tablet   5   . vitamin B-12 (CYANOCOBALAMIN) 1000 MCG tablet   Oral   Take 1  tablet (1,000 mcg total) by mouth daily.   30 tablet   5     Allergies Review of patient's allergies indicates no known allergies.  History reviewed. No pertinent family history.  Social History Social History  Substance Use Topics  . Smoking status: Current Every Day Smoker -- 5.00 packs/day for 25 years    Types: Cigarettes  . Smokeless tobacco: None  . Alcohol Use: No    Review of Systems Constitutional: No fever/chills Eyes: No visual changes. ENT: No sore throat. Cardiovascular: Denies chest pain. Respiratory: Denies shortness of breath. Gastrointestinal: No abdominal pain.  No nausea, no vomiting.  No diarrhea.  No constipation. Genitourinary: Negative for dysuria. Musculoskeletal: Negative for back pain. Skin: Negative for rash. Neurological: Negative for headaches, focal weakness or numbness.  10-point ROS otherwise  negative.  ____________________________________________   PHYSICAL EXAM:  VITAL SIGNS: ED Triage Vitals  Enc Vitals Group     BP 08/15/15 1348 158/74 mmHg     Pulse Rate 08/15/15 1348 71     Resp 08/15/15 1348 14     Temp 08/15/15 1348 98.4 F (36.9 C)     Temp Source 08/15/15 1348 Oral     SpO2 08/15/15 1348 99 %     Weight 08/15/15 1348 163 lb (73.936 kg)     Height --      Head Cir --      Peak Flow --      Pain Score --      Pain Loc --      Pain Edu? --      Excl. in GC? --     Constitutional: Alert and oriented x 4. Well appearing and in no acute distress. Eyes: Conjunctivae are normal. PERRL. EOMI. Head: Atraumatic. Nose: No congestion/rhinnorhea. Mouth/Throat: Mucous membranes are dry.  Oropharynx non-erythematous. Neck: No stridor.  No cervical spine tenderness to palpation. Cardiovascular: Normal rate, regular rhythm. Grossly normal heart sounds.  Good peripheral circulation. Respiratory: Normal respiratory effort.  No retractions. Lungs CTAB. Gastrointestinal: Soft and nontender. No distention.  No CVA tenderness. Genitourinary: deferred Musculoskeletal: No lower extremity tenderness nor edema.  No joint effusions. Neurologic:  Normal speech and language. No gross focal neurologic deficits are appreciated. No gait instability. 5 out of 5 strength in bilateral upper and lower extremities, sensation intact to light touch throughout. Skin:  Skin is warm, dry and intact. No rash noted. Psychiatric: Mood and affect are normal. Speech and behavior are normal.  ____________________________________________   LABS (all labs ordered are listed, but only abnormal results are displayed)  Labs Reviewed  CBC WITH DIFFERENTIAL/PLATELET - Abnormal; Notable for the following:    WBC 3.5 (*)    Platelets 147 (*)    Lymphs Abs 0.7 (*)    All other components within normal limits  COMPREHENSIVE METABOLIC PANEL - Abnormal; Notable for the following:    Potassium 3.4 (*)     AST 113 (*)    All other components within normal limits  URINALYSIS COMPLETEWITH MICROSCOPIC ([facility] ONLY) - Abnormal; Notable for the following:    Color, Urine YELLOW (*)    APPearance CLEAR (*)    Bacteria, UA RARE (*)    Squamous Epithelial / LPF 0-5 (*)    All other components within normal limits  TROPONIN I   ____________________________________________  EKG  ED ECG REPORT I, Gayla DossGayle, Javell Blackburn A, the attending physician, personally viewed and interpreted this ECG.   Date: 08/15/2015  EKG Time: 13:43  Rate: 70  Rhythm:  normal sinus rhythm  Axis: normal  Intervals:left bundle branch block  ST&T Change: No acute ST elevation.  ____________________________________________  RADIOLOGY  CT head  IMPRESSION: No acute intracranial abnormality.  CXR- pending IMPRESSION: No edema or consolidation. No pneumothorax. ____________________________________________   PROCEDURES  Procedure(s) performed: None  Critical Care performed: No  ____________________________________________   INITIAL IMPRESSION / ASSESSMENT AND PLAN / ED COURSE  Pertinent labs & imaging results that were available during my care of the patient were reviewed by me and considered in my medical decision making (see chart for details).  Timothy [name] is a 82 y.o. male with history of schizophrenia and seizures, hypertension and hyperlipidemia who presents for evaluation after a fall that occurred suddenly just prior to arrival. On exam, he is very well-appearing and in no acute distress, his vital signs are stable, he is afebrile. Initially there was some concern that his speech was slurred however his mucous membranes were so dry when he came in, he received water to drink after which his speech was completely clear. There is no aphasia, no dysarthria. He is alert and oriented 4. He has an intact neurological exam and generally benign/atraumatic examination. He has no complaints but as he appears  somewhat dehydrated and given the concern of staff at his living facility that he has been "weak" over the past several days, we'll obtain screening labs, urinalysis, chest x-ray as well as CT head. If these are unremarkable and the patient continues to appear well, anticipate he'll can be discharged with close PCP follow-up.   ----------------------------------------- 3:40 PM on 08/15/2015 ----------------------------------------- I reviewed the patient's labs. CBC with mild leukopenia however this is chronic and unchanged from prior. CMP generally unremarkable with the exception of a mild nonspecific AST elevation. Negative troponin. Urinalysis consistent with infection. CT head and chest x-ray are negative for any acute pathology. The patient continues to appear well. We'll DC with return precautions and close follow-up as above.   ____________________________________________   FINAL CLINICAL IMPRESSION(S) / ED DIAGNOSES  Final diagnoses:  Fall, initial encounter  Closed head injury, initial encounter      NEW MEDICATIONS STARTED DURING THIS VISIT:  New Prescriptions   No medications on file     Note:  This document was prepared using Dragon voice recognition software and may include unintentional dictation errors.    Gayla Doss, MD 08/15/15 1514  Gayla Doss, MD 08/15/15 1542  Gayla Doss, MD 08/15/15 1542  Gayla Doss, MD 08/15/15 [phone]

## 2015-08-15 NOTE — ED Notes (Signed)
Pt returned to group home by EMS with discharge instructions to be given to staff. NAD at this time.

## 2015-08-15 NOTE — ED Notes (Signed)
Pt arrived by EMS from [facility] group home [facility] after staff called stating pt had fallen. Upon arrival EMS was told that the pt has not been himself for 2-3 days. Pt has AMS and speech is slurred and difficult to understand. Pt has weakness in all extremities. Pt is alert to location but not to year or who the current president is. Pt was able to answer questions for registration correctly.

## 2015-08-19 ENCOUNTER — Ambulatory Visit (INDEPENDENT_AMBULATORY_CARE_PROVIDER_SITE_OTHER): Payer: Medicare Other | Admitting: Family Medicine

## 2015-08-19 ENCOUNTER — Encounter: Payer: Self-pay | Admitting: Family Medicine

## 2015-08-19 VITALS — BP 110/70 | HR 78 | Ht 70.0 in | Wt 160.0 lb

## 2015-08-19 DIAGNOSIS — M129 Arthropathy, unspecified: Secondary | ICD-10-CM

## 2015-08-19 DIAGNOSIS — M170 Bilateral primary osteoarthritis of knee: Secondary | ICD-10-CM

## 2015-08-19 DIAGNOSIS — R404 Transient alteration of awareness: Secondary | ICD-10-CM | POA: Diagnosis not present

## 2015-08-19 MED ORDER — DOXYCYCLINE HYCLATE 100 MG PO TABS: 100.0000 mg | ORAL_TABLET | Freq: Two times a day (BID) | ORAL | Status: DC

## 2015-08-19 NOTE — Progress Notes (Signed)
Name: Timothy [name]   MRN: [id number]    DOB: Feb 28, 1933   Date:08/19/2015       Progress Note  Subjective  Chief Complaint  Chief Complaint  Patient presents with  . Follow-up    fall    HPI Comments: Patient for folloup from er evaluation for fall.  Knee Pain  The incident occurred 3 to 5 days ago. The incident occurred at a nursing home. The injury mechanism was a fall. The pain is present in the right knee and left knee. The quality of the pain is described as aching. Associated symptoms include an inability to bear weight and a loss of motion. Pertinent negatives include no loss of sensation, muscle weakness, numbness or tingling. Associated symptoms comments: Difficulty with balance and gait. He has tried nothing for the symptoms. The treatment provided mild relief.    No problem-specific assessment & plan notes found for this encounter.   Past Medical History  Diagnosis Date  . Seizures (HCC)   . TIA (transient ischemic attack)   . Schizophrenia (HCC)     No past surgical history on file.  No family history on file.  Social History   Social History  . Marital Status: Widowed    Spouse Name: N/A  . Number of Children: N/A  . Years of Education: N/A   Occupational History  . Not on file.   Social History Main Topics  . Smoking status: Current Every Day Smoker -- 5.00 packs/day for 25 years    Types: Cigarettes  . Smokeless tobacco: Not on file  . Alcohol Use: No  . Drug Use: No  . Sexual Activity: Not Currently   Other Topics Concern  . Not on file   Social History Narrative    No Known Allergies   Review of Systems  Constitutional: Negative for fever, chills, weight loss and malaise/fatigue.  HENT: Negative for ear discharge, ear pain and sore throat.   Eyes: Negative for blurred vision.  Respiratory: Negative for cough, sputum production, shortness of breath and wheezing.   Cardiovascular: Negative for chest pain, palpitations and leg swelling.   Gastrointestinal: Negative for heartburn, nausea, abdominal pain, diarrhea, constipation, blood in stool and melena.  Genitourinary: Negative for dysuria, urgency, frequency and hematuria.  Musculoskeletal: Negative for myalgias, back pain, joint pain and neck pain.  Skin: Negative for rash.  Neurological: Negative for dizziness, tingling, sensory change, focal weakness, numbness and headaches.  Endo/Heme/Allergies: Negative for environmental allergies and polydipsia. Does not bruise/bleed easily.  Psychiatric/Behavioral: Negative for depression and suicidal ideas. The patient is not nervous/anxious and does not have insomnia.      Objective  Filed Vitals:   08/19/15 1051  BP: 110/70  Pulse: 78  Height:  (1.778 m)  Weight: 160 lb (72.576 kg)  SpO2: 98%    Physical Exam  Constitutional: He is oriented to person, place, and time and well-developed, well-nourished, and in no distress.  HENT:  Head: Normocephalic.  Right Ear: External ear normal.  Left Ear: External ear normal.  Nose: Nose normal.  Mouth/Throat: Oropharynx is clear and moist.  Eyes: Conjunctivae and EOM are normal. Pupils are equal, round, and reactive to light. Right eye exhibits no discharge. Left eye exhibits no discharge. No scleral icterus.  Neck: Normal range of motion. Neck supple. No JVD present. No tracheal deviation present. No thyromegaly present.  Cardiovascular: Normal rate, regular rhythm, normal heart sounds and intact distal pulses.  Exam reveals no gallop and no friction rub.  No murmur heard. Pulmonary/Chest: Breath sounds normal. No respiratory distress. He has no wheezes. He has no rales.  Abdominal: Soft. Bowel sounds are normal. He exhibits no mass. There is no hepatosplenomegaly. There is no tenderness. There is no rebound, no guarding and no CVA tenderness.  Musculoskeletal: Normal range of motion. He exhibits no edema or tenderness.  Lymphadenopathy:    He has no cervical adenopathy.   Neurological: He is alert and oriented to person, place, and time. He has normal sensation, normal strength, normal reflexes and intact cranial nerves. No cranial nerve deficit.  Skin: Skin is warm. No rash noted.  Psychiatric: Mood and affect normal.      Assessment & Plan  Problem List Items Addressed This Visit    None    Visit Diagnoses    Arthritis of both knees    -  Primary    walker suggested    Transient alteration of awareness        awaiting medications/ reviewed er labs/xrays    Relevant Medications    doxycycline (VIBRA-TABS) 100 MG tablet         Dr. Hayden Rasmusseneanna Jones [facility]  08/19/2015

## 2015-08-29 ENCOUNTER — Inpatient Hospital Stay
Admission: EM | Admit: 2015-08-29 | Discharge: 2015-09-02 | DRG: 308 | Disposition: A | Discharge status: Skilled Nursing Facility | Payer: Medicare Other | Attending: Internal Medicine | Admitting: Internal Medicine

## 2015-08-29 ENCOUNTER — Other Ambulatory Visit: Payer: Self-pay

## 2015-08-29 ENCOUNTER — Emergency Department: Payer: Medicare Other

## 2015-08-29 DIAGNOSIS — M6281 Muscle weakness (generalized): Secondary | ICD-10-CM | POA: Diagnosis not present

## 2015-08-29 DIAGNOSIS — E860 Dehydration: Secondary | ICD-10-CM | POA: Diagnosis present

## 2015-08-29 DIAGNOSIS — Z8673 Personal history of transient ischemic attack (TIA), and cerebral infarction without residual deficits: Secondary | ICD-10-CM

## 2015-08-29 DIAGNOSIS — F17210 Nicotine dependence, cigarettes, uncomplicated: Secondary | ICD-10-CM | POA: Diagnosis present

## 2015-08-29 DIAGNOSIS — L8990 Pressure ulcer of unspecified site, unspecified stage: Secondary | ICD-10-CM | POA: Diagnosis present

## 2015-08-29 DIAGNOSIS — G40802 Other epilepsy, not intractable, without status epilepticus: Secondary | ICD-10-CM | POA: Diagnosis not present

## 2015-08-29 DIAGNOSIS — R1310 Dysphagia, unspecified: Secondary | ICD-10-CM | POA: Diagnosis present

## 2015-08-29 DIAGNOSIS — F0281 Dementia in other diseases classified elsewhere with behavioral disturbance: Secondary | ICD-10-CM | POA: Diagnosis not present

## 2015-08-29 DIAGNOSIS — Z79899 Other long term (current) drug therapy: Secondary | ICD-10-CM | POA: Diagnosis not present

## 2015-08-29 DIAGNOSIS — I4892 Unspecified atrial flutter: Secondary | ICD-10-CM | POA: Diagnosis present

## 2015-08-29 DIAGNOSIS — I350 Nonrheumatic aortic (valve) stenosis: Secondary | ICD-10-CM | POA: Diagnosis present

## 2015-08-29 DIAGNOSIS — T17908A Unspecified foreign body in respiratory tract, part unspecified causing other injury, initial encounter: Secondary | ICD-10-CM

## 2015-08-29 DIAGNOSIS — I509 Heart failure, unspecified: Secondary | ICD-10-CM | POA: Diagnosis present

## 2015-08-29 DIAGNOSIS — I110 Hypertensive heart disease with heart failure: Secondary | ICD-10-CM | POA: Diagnosis present

## 2015-08-29 DIAGNOSIS — I471 Supraventricular tachycardia: Secondary | ICD-10-CM | POA: Diagnosis present

## 2015-08-29 DIAGNOSIS — D519 Vitamin B12 deficiency anemia, unspecified: Secondary | ICD-10-CM | POA: Diagnosis not present

## 2015-08-29 DIAGNOSIS — F0391 Unspecified dementia with behavioral disturbance: Secondary | ICD-10-CM | POA: Diagnosis present

## 2015-08-29 DIAGNOSIS — E870 Hyperosmolality and hypernatremia: Secondary | ICD-10-CM | POA: Diagnosis present

## 2015-08-29 DIAGNOSIS — J690 Pneumonitis due to inhalation of food and vomit: Secondary | ICD-10-CM | POA: Diagnosis present

## 2015-08-29 DIAGNOSIS — F0390 Unspecified dementia without behavioral disturbance: Secondary | ICD-10-CM

## 2015-08-29 DIAGNOSIS — G40909 Epilepsy, unspecified, not intractable, without status epilepticus: Secondary | ICD-10-CM | POA: Diagnosis present

## 2015-08-29 DIAGNOSIS — F445 Conversion disorder with seizures or convulsions: Secondary | ICD-10-CM | POA: Diagnosis not present

## 2015-08-29 DIAGNOSIS — I4891 Unspecified atrial fibrillation: Secondary | ICD-10-CM | POA: Diagnosis present

## 2015-08-29 DIAGNOSIS — J181 Lobar pneumonia, unspecified organism: Secondary | ICD-10-CM | POA: Diagnosis not present

## 2015-08-29 DIAGNOSIS — R05 Cough: Secondary | ICD-10-CM | POA: Diagnosis not present

## 2015-08-29 DIAGNOSIS — M1990 Unspecified osteoarthritis, unspecified site: Secondary | ICD-10-CM | POA: Diagnosis not present

## 2015-08-29 DIAGNOSIS — E785 Hyperlipidemia, unspecified: Secondary | ICD-10-CM | POA: Diagnosis present

## 2015-08-29 DIAGNOSIS — Z7401 Bed confinement status: Secondary | ICD-10-CM | POA: Diagnosis not present

## 2015-08-29 DIAGNOSIS — E876 Hypokalemia: Secondary | ICD-10-CM | POA: Diagnosis present

## 2015-08-29 DIAGNOSIS — Z5189 Encounter for other specified aftercare: Secondary | ICD-10-CM | POA: Diagnosis not present

## 2015-08-29 DIAGNOSIS — I447 Left bundle-branch block, unspecified: Secondary | ICD-10-CM | POA: Diagnosis present

## 2015-08-29 DIAGNOSIS — R404 Transient alteration of awareness: Secondary | ICD-10-CM | POA: Diagnosis not present

## 2015-08-29 DIAGNOSIS — R4182 Altered mental status, unspecified: Secondary | ICD-10-CM | POA: Diagnosis not present

## 2015-08-29 DIAGNOSIS — I10 Essential (primary) hypertension: Secondary | ICD-10-CM | POA: Diagnosis not present

## 2015-08-29 DIAGNOSIS — R1312 Dysphagia, oropharyngeal phase: Secondary | ICD-10-CM | POA: Diagnosis not present

## 2015-08-29 HISTORY — DX: Essential (primary) hypertension: I10

## 2015-08-29 LAB — CBC WITH DIFFERENTIAL/PLATELET
Basophils Absolute: 0 10*3/uL (ref 0–0.1)
Basophils Relative: 0 %
Eosinophils Absolute: 0.1 10*3/uL (ref 0–0.7)
Eosinophils Relative: 1 %
HEMATOCRIT: 42.4 % (ref 40.0–52.0)
Hemoglobin: 13.9 g/dL (ref 13.0–18.0)
LYMPHS PCT: 10 %
Lymphs Abs: 0.9 10*3/uL — ABNORMAL LOW (ref 1.0–3.6)
MCH: 27.4 pg (ref 26.0–34.0)
MCHC: 32.8 g/dL (ref 32.0–36.0)
MCV: 83.6 fL (ref 80.0–100.0)
MONO ABS: 1.1 10*3/uL — AB (ref 0.2–1.0)
MONOS PCT: 12 %
NEUTROS ABS: 7.3 10*3/uL — AB (ref 1.4–6.5)
Neutrophils Relative %: 77 %
Platelets: 295 10*3/uL (ref 150–440)
RBC: 5.07 MIL/uL (ref 4.40–5.90)
RDW: 14.4 % (ref 11.5–14.5)
WBC: 9.4 10*3/uL (ref 3.8–10.6)

## 2015-08-29 LAB — URINALYSIS COMPLETE WITH MICROSCOPIC (ARMC ONLY)
BILIRUBIN URINE: NEGATIVE
Bacteria, UA: NONE SEEN
Glucose, UA: NEGATIVE mg/dL
HGB URINE DIPSTICK: NEGATIVE
KETONES UR: NEGATIVE mg/dL
LEUKOCYTES UA: NEGATIVE
NITRITE: NEGATIVE
PH: 5 (ref 5.0–8.0)
Protein, ur: 30 mg/dL — AB
RBC / HPF: NONE SEEN RBC/hpf (ref 0–5)
SQUAMOUS EPITHELIAL / LPF: NONE SEEN
Specific Gravity, Urine: 1.024 (ref 1.005–1.030)
WBC, UA: NONE SEEN WBC/hpf (ref 0–5)

## 2015-08-29 LAB — PROTIME-INR
INR: 1.23
Prothrombin Time: 15.7 seconds — ABNORMAL HIGH (ref 11.4–15.0)

## 2015-08-29 LAB — LACTIC ACID, PLASMA
LACTIC ACID, VENOUS: 1.4 mmol/L (ref 0.5–2.0)
Lactic Acid, Venous: 1.5 mmol/L (ref 0.5–2.0)

## 2015-08-29 LAB — TROPONIN I: Troponin I: <0.03 ng/mL (ref <0.031)

## 2015-08-29 LAB — COMPREHENSIVE METABOLIC PANEL
ALK PHOS: 71 U/L (ref 38–126)
ALT: 136 U/L — AB (ref 17–63)
AST: 290 U/L — AB (ref 15–41)
Albumin: 3.2 g/dL — ABNORMAL LOW (ref 3.5–5.0)
Anion gap: 9 (ref 5–15)
BUN: 39 mg/dL — AB (ref 6–20)
CHLORIDE: 108 mmol/L (ref 101–111)
CO2: 30 mmol/L (ref 22–32)
CREATININE: 1.28 mg/dL — AB (ref 0.61–1.24)
Calcium: 9.4 mg/dL (ref 8.9–10.3)
GFR calc Af Amer: 59 mL/min — ABNORMAL LOW (ref >60)
GFR, EST NON AFRICAN AMERICAN: 51 mL/min — AB (ref >60)
Glucose, Bld: 115 mg/dL — ABNORMAL HIGH (ref 65–99)
Potassium: 3.3 mmol/L — ABNORMAL LOW (ref 3.5–5.1)
Sodium: 147 mmol/L — ABNORMAL HIGH (ref 135–145)
Total Bilirubin: 1.2 mg/dL (ref 0.3–1.2)
Total Protein: 7.6 g/dL (ref 6.5–8.1)

## 2015-08-29 LAB — SURGICAL PCR SCREEN
MRSA, PCR: NEGATIVE
STAPHYLOCOCCUS AUREUS: NEGATIVE

## 2015-08-29 LAB — T4, FREE: FREE T4: 1.34 ng/dL — AB (ref 0.61–1.12)

## 2015-08-29 LAB — EXPECTORATED SPUTUM ASSESSMENT W REFEX TO RESP CULTURE

## 2015-08-29 LAB — ETHANOL: Alcohol, Ethyl (B): <5 mg/dL (ref <5)

## 2015-08-29 LAB — PHOSPHORUS: Phosphorus: 2.7 mg/dL (ref 2.5–4.6)

## 2015-08-29 LAB — EXPECTORATED SPUTUM ASSESSMENT W GRAM STAIN, RFLX TO RESP C

## 2015-08-29 LAB — APTT: aPTT: 27 seconds (ref 24–36)

## 2015-08-29 LAB — TSH: TSH: 2.183 u[IU]/mL (ref 0.350–4.500)

## 2015-08-29 LAB — LIPASE, BLOOD: LIPASE: 16 U/L (ref 11–51)

## 2015-08-29 LAB — MAGNESIUM: Magnesium: 2.6 mg/dL — ABNORMAL HIGH (ref 1.7–2.4)

## 2015-08-29 MED ORDER — DILTIAZEM HCL 25 MG/5ML IV SOLN
25.0000 mg | Freq: Once | INTRAVENOUS | Status: DC
  Filled 2015-08-29: qty 5

## 2015-08-29 MED ORDER — DEXTROSE 5 % IV SOLN
500.0000 mg | Freq: Once | INTRAVENOUS | Status: AC
  Administered 2015-08-29: 500 mg via INTRAVENOUS
  Filled 2015-08-29: qty 500

## 2015-08-29 MED ORDER — SODIUM CHLORIDE 0.9% FLUSH
3.0000 mL | Freq: Two times a day (BID) | INTRAVENOUS | Status: DC
  Administered 2015-08-29 – 2015-09-02 (×7): 3 mL via INTRAVENOUS

## 2015-08-29 MED ORDER — DIPHENHYDRAMINE HCL 50 MG/ML IJ SOLN
25.0000 mg | Freq: Once | INTRAMUSCULAR | Status: AC
  Administered 2015-08-29: 25 mg via INTRAVENOUS
  Filled 2015-08-29: qty 1

## 2015-08-29 MED ORDER — SODIUM CHLORIDE 0.9 % IV SOLN
1500.0000 mg | Freq: Once | INTRAVENOUS | Status: DC
  Filled 2015-08-29: qty 15

## 2015-08-29 MED ORDER — ATORVASTATIN CALCIUM 10 MG PO TABS
10.0000 mg | ORAL_TABLET | Freq: Every day | ORAL | Status: DC
  Administered 2015-08-29 – 2015-09-01 (×4): 10 mg via ORAL
  Filled 2015-08-29 (×4): qty 1

## 2015-08-29 MED ORDER — HALOPERIDOL LACTATE 5 MG/ML IJ SOLN
INTRAMUSCULAR | Status: AC
  Administered 2015-08-29: 5 mg via INTRAVENOUS
  Filled 2015-08-29: qty 1

## 2015-08-29 MED ORDER — HALOPERIDOL LACTATE 5 MG/ML IJ SOLN
5.0000 mg | Freq: Once | INTRAMUSCULAR | Status: AC
  Administered 2015-08-29: 5 mg via INTRAVENOUS

## 2015-08-29 MED ORDER — VITAMIN B-12 1000 MCG PO TABS
1000.0000 ug | ORAL_TABLET | Freq: Every day | ORAL | Status: DC
  Administered 2015-08-30 – 2015-09-02 (×4): 1000 ug via ORAL
  Filled 2015-08-29 (×4): qty 1

## 2015-08-29 MED ORDER — ACETAMINOPHEN 325 MG PO TABS: 650.0000 mg | ORAL_TABLET | Freq: Four times a day (QID) | ORAL | Status: DC | PRN

## 2015-08-29 MED ORDER — VITAMIN D 1000 UNITS PO TABS
1000.0000 [IU] | ORAL_TABLET | Freq: Every day | ORAL | Status: DC
  Administered 2015-08-30 – 2015-09-02 (×4): 1000 [IU] via ORAL
  Filled 2015-08-29 (×4): qty 1

## 2015-08-29 MED ORDER — AMLODIPINE BESYLATE 10 MG PO TABS
10.0000 mg | ORAL_TABLET | Freq: Every day | ORAL | Status: DC
  Administered 2015-08-30 – 2015-09-02 (×4): 10 mg via ORAL
  Filled 2015-08-29 (×4): qty 1

## 2015-08-29 MED ORDER — LEVETIRACETAM 500 MG PO TABS
1500.0000 mg | ORAL_TABLET | Freq: Two times a day (BID) | ORAL | Status: DC
  Administered 2015-08-29 – 2015-08-30 (×2): 1500 mg via ORAL
  Filled 2015-08-29 (×3): qty 3

## 2015-08-29 MED ORDER — AMIODARONE HCL IN DEXTROSE 360-4.14 MG/200ML-% IV SOLN
30.0000 mg/h | INTRAVENOUS | Status: DC
  Administered 2015-08-30 – 2015-08-31 (×4): 30 mg/h via INTRAVENOUS
  Filled 2015-08-29 (×8): qty 200

## 2015-08-29 MED ORDER — MEMANTINE HCL 10 MG PO TABS
5.0000 mg | ORAL_TABLET | Freq: Every day | ORAL | Status: DC
  Administered 2015-08-30 – 2015-09-02 (×4): 5 mg via ORAL
  Filled 2015-08-29 (×4): qty 1

## 2015-08-29 MED ORDER — DILTIAZEM HCL 25 MG/5ML IV SOLN
15.0000 mg | Freq: Once | INTRAVENOUS | Status: AC
  Administered 2015-08-29: 15 mg via INTRAVENOUS
  Filled 2015-08-29: qty 5

## 2015-08-29 MED ORDER — AMIODARONE HCL IN DEXTROSE 360-4.14 MG/200ML-% IV SOLN
60.0000 mg/h | INTRAVENOUS | Status: DC
  Administered 2015-08-29 (×2): 60 mg/h via INTRAVENOUS
  Filled 2015-08-29: qty 200

## 2015-08-29 MED ORDER — ACETAMINOPHEN 650 MG RE SUPP: 650.0000 mg | Freq: Four times a day (QID) | RECTAL | Status: DC | PRN

## 2015-08-29 MED ORDER — CALCIUM GLUCONATE 10 % IV SOLN
1.0000 g | Freq: Once | INTRAVENOUS | Status: DC
  Filled 2015-08-29: qty 10

## 2015-08-29 MED ORDER — SODIUM CHLORIDE 0.9 % IV BOLUS (SEPSIS)
2000.0000 mL | Freq: Once | INTRAVENOUS | Status: AC
  Administered 2015-08-29: 2000 mL via INTRAVENOUS

## 2015-08-29 MED ORDER — DEXTROSE 5 % IV SOLN
1.0000 g | Freq: Once | INTRAVENOUS | Status: AC
  Administered 2015-08-29: 1 g via INTRAVENOUS
  Filled 2015-08-29: qty 10

## 2015-08-29 MED ORDER — ENOXAPARIN SODIUM 40 MG/0.4ML ~~LOC~~ SOLN
40.0000 mg | SUBCUTANEOUS | Status: DC
  Administered 2015-08-29 – 2015-09-01 (×4): 40 mg via SUBCUTANEOUS
  Filled 2015-08-29 (×4): qty 0.4

## 2015-08-29 MED ORDER — ONDANSETRON HCL 4 MG/2ML IJ SOLN: 4.0000 mg | Freq: Four times a day (QID) | INTRAMUSCULAR | Status: DC | PRN

## 2015-08-29 MED ORDER — LAMOTRIGINE 100 MG PO TABS
150.0000 mg | ORAL_TABLET | Freq: Two times a day (BID) | ORAL | Status: DC
  Administered 2015-08-29 – 2015-09-02 (×8): 150 mg via ORAL
  Filled 2015-08-29 (×2): qty 2
  Filled 2015-08-29: qty 1.5
  Filled 2015-08-29: qty 2
  Filled 2015-08-29: qty 1.5
  Filled 2015-08-29 (×2): qty 2
  Filled 2015-08-29 (×2): qty 1
  Filled 2015-08-29: qty 2

## 2015-08-29 MED ORDER — POTASSIUM CHLORIDE CRYS ER 20 MEQ PO TBCR
20.0000 meq | EXTENDED_RELEASE_TABLET | Freq: Every day | ORAL | Status: DC
  Administered 2015-08-30: 20 meq via ORAL
  Filled 2015-08-29 (×2): qty 1

## 2015-08-29 MED ORDER — ONDANSETRON HCL 4 MG PO TABS: 4.0000 mg | ORAL_TABLET | Freq: Four times a day (QID) | ORAL | Status: DC | PRN

## 2015-08-29 MED ORDER — AMIODARONE LOAD VIA INFUSION
150.0000 mg | Freq: Once | INTRAVENOUS | Status: AC
  Administered 2015-08-29: 150 mg via INTRAVENOUS
  Filled 2015-08-29: qty 83.34

## 2015-08-29 MED ORDER — DEXTROSE 5 % IV SOLN
60.0000 mg/h | Freq: Once | INTRAVENOUS | Status: DC
  Filled 2015-08-29: qty 9

## 2015-08-29 MED ORDER — HALOPERIDOL LACTATE 5 MG/ML IJ SOLN
2.5000 mg | INTRAMUSCULAR | Status: DC | PRN
  Administered 2015-09-01: 2.5 mg via INTRAVENOUS
  Filled 2015-08-29: qty 1

## 2015-08-29 MED ORDER — POTASSIUM CL IN DEXTROSE 5% 20 MEQ/L IV SOLN
20.0000 meq | INTRAVENOUS | Status: DC
  Administered 2015-08-29 – 2015-09-01 (×3): 20 meq via INTRAVENOUS
  Filled 2015-08-29 (×7): qty 1000

## 2015-08-29 NOTE — H&P (Signed)
[facility] [place] at [facility]   PATIENT NAME: Timothy [name]    MR#:  [id number]  DATE OF BIRTH:  1933/02/28  DATE OF ADMISSION:  08/29/2015  PRIMARY CARE PHYSICIAN: No PCP Per Patient   REQUESTING/REFERRING PHYSICIAN: Dr. Alfonse Flavors  CHIEF COMPLAINT:   Chief Complaint  Patient presents with  . Altered Mental Status    HISTORY OF PRESENT ILLNESS:  Timothy [name]  is a 82 y.o. male with a known history of Dementia, history of seizures, schizophrenia, hypertension who presented to the hospital from a group home secondary to altered mental status and also noted to be tachycardic. Patient himself has advanced dementia and therefore history is unobtainable and mostly obtained from the chart and also from the ER physician. As per the ER physician patient reports to the hospital due to change in mental status. Patient in the ER was noted to be significantly tachycardic with heart rate in the 120s to 130s. EKG does show some P waves but is some concern for underlying atrial arrhythmia possibly flutter or atrial tachycardia. The ER physician spoke to the cardiologist who recommended starting the patient on an amiodarone drip and therefore hospitalist services were contacted further treatment and evaluation.  PAST MEDICAL HISTORY:   Past Medical History  Diagnosis Date  . Seizures (HCC)   . TIA (transient ischemic attack)   . Schizophrenia (HCC)   . Hypertension     PAST SURGICAL HISTORY:  History reviewed. No pertinent past surgical history.  SOCIAL HISTORY:   Social History  Substance Use Topics  . Smoking status: Current Every Day Smoker -- 5.00 packs/day for 25 years    Types: Cigarettes  . Smokeless tobacco: Not on file  . Alcohol Use: No    FAMILY HISTORY:  No family history on file.  DRUG ALLERGIES:  No Known Allergies  REVIEW OF SYSTEMS:   Review of Systems  Unable to perform ROS: dementia    MEDICATIONS AT HOME:   Prior to  Admission medications   Medication Sig Start Date End Date Taking? Authorizing Provider  acetaminophen (TYLENOL) 325 MG tablet Take 2 tablets (650 mg total) by mouth 2 (two) times daily. 06/09/15  Yes Duanne Limerick, MD  amLODipine (NORVASC) 10 MG tablet Take 1 tablet (10 mg total) by mouth daily. 06/09/15  Yes Duanne Limerick, MD  atorvastatin (LIPITOR) 20 MG tablet Take 0.5 tablets (10 mg total) by mouth at bedtime. 06/09/15  Yes Duanne Limerick, MD  cholecalciferol (VITAMIN D) 1000 units tablet Take 1,000 Units by mouth daily.   Yes Historical Provider, MD  lamoTRIgine (LAMICTAL) 150 MG tablet Take 150 mg by mouth 2 (two) times daily.   Yes Historical Provider, MD  levETIRAcetam (KEPPRA) 750 MG tablet Take 2 tablets (1,500 mg total) by mouth 2 (two) times daily. 06/09/15  Yes Duanne Limerick, MD  memantine (NAMENDA) 5 MG tablet Take 1 tablet (5 mg total) by mouth daily. 06/09/15  Yes Duanne Limerick, MD  potassium chloride SA (K-DUR,KLOR-CON) 20 MEQ tablet Take 1 tablet (20 mEq total) by mouth once. Patient taking differently: Take 20 mEq by mouth daily.  06/09/15  Yes Duanne Limerick, MD  vitamin B-12 (CYANOCOBALAMIN) 1000 MCG tablet Take 1 tablet (1,000 mcg total) by mouth daily. 06/09/15  Yes Duanne Limerick, MD  doxycycline (VIBRA-TABS) 100 MG tablet Take 1 tablet (100 mg total) by mouth 2 (two) times daily. 08/19/15   Duanne Limerick, MD  ergocalciferol (VITAMIN D2)  50000 units capsule Take 1 capsule (50,000 Units total) by mouth once a week. 06/09/15   Duanne Limerickeanna C Jones, MD      VITAL SIGNS:  Blood pressure 122/86, pulse 139, temperature 97.5 F (36.4 C), temperature source Oral, resp. rate 28, height 5\' 11"  (1.803 m), weight 72.576 kg (160 lb), SpO2 97 %.  PHYSICAL EXAMINATION:  Physical Exam  GENERAL:  82 y.o.-year-old patient lying in the bed Confused and agitated but in no acute distress.  EYES: Pupils equal, round, reactive to light and accommodation. No scleral icterus. Extraocular muscles  intact.  HEENT: Head atraumatic, normocephalic. Oropharynx and nasopharynx clear. No oropharyngeal erythema, moist oral mucosa  NECK:  Supple, no jugular venous distention. No thyroid enlargement, no tenderness.  LUNGS: Normal breath sounds bilaterally, no wheezing, rales, rhonchi. No use of accessory muscles of respiration.  CARDIOVASCULAR: S1, S2 Irregular & Tachycardic. No murmurs, rubs, gallops, clicks.  ABDOMEN: Soft, nontender, nondistended. Bowel sounds present. No organomegaly or mass.  EXTREMITIES: No pedal edema, cyanosis, or clubbing. + 2 pedal & radial pulses b/l.   NEUROLOGIC: Cranial nerves II through XII are intact. No focal Motor or sensory deficits appreciated b/l.  Globally weak.   PSYCHIATRIC: The patient is alert and oriented x 1.  SKIN: No obvious rash, lesion, or ulcer.   LABORATORY PANEL:   CBC  Recent Labs Lab 08/29/15 1535  WBC 9.4  HGB 13.9  HCT 42.4  PLT 295   ------------------------------------------------------------------------------------------------------------------  Chemistries   Recent Labs Lab 08/29/15 1535  NA 147*  K 3.3*  CL 108  CO2 30  GLUCOSE 115*  BUN 39*  CREATININE 1.28*  CALCIUM 9.4  MG 2.6*  AST 290*  ALT 136*  ALKPHOS 71  BILITOT 1.2   ------------------------------------------------------------------------------------------------------------------  Cardiac Enzymes  Recent Labs Lab 08/29/15 1535  TROPONINI <0.03   ------------------------------------------------------------------------------------------------------------------  RADIOLOGY:  Ct Head Wo Contrast  08/29/2015  CLINICAL DATA:  Per staff at care home: states patient has decline in health over last month. States today, more AMS than usual. EXAM: CT HEAD WITHOUT CONTRAST TECHNIQUE: Contiguous axial images were obtained from the base of the skull through the vertex without intravenous contrast. COMPARISON:  08/15/2015. FINDINGS: The ventricles are normal  in configuration. There is age related ventricular and sulcal enlargement, stable from the prior study. There are no parenchymal masses or mass effect. There is no evidence of a recent infarct. Mild periventricular white matter hypoattenuation is noted consistent with chronic microvascular ischemic change. There are no extra-axial masses or abnormal fluid collections. There is no intracranial hemorrhage. There is an old medial left orbital fracture, stable visualized sinuses and mastoid air cells are clear. IMPRESSION: 1. No acute intracranial abnormalities. No change from the recent prior study. Electronically Signed   By: Amie Portlandavid  Ormond M.D.   On: 08/29/2015 16:20   Dg Chest Port 1 View  08/29/2015  CLINICAL DATA:  Altered mental status.  Cough. EXAM: PORTABLE CHEST 1 VIEW COMPARISON:  Radiograph of August 15, 2015. FINDINGS: The heart size and mediastinal contours are within normal limits. No pneumothorax or pleural effusion is noted. Left lung is clear. Minimal right basilar subsegmental atelectasis is noted. The visualized skeletal structures are unremarkable. IMPRESSION: Minimal right basilar subsegmental atelectasis. Electronically Signed   By: Lupita RaiderJames  Green Jr, M.D.   On: 08/29/2015 16:15     IMPRESSION AND PLAN:   82 year old male with past medical history of advanced dementia, seizures, previous history of TIAs, essential hypertension who presents to the hospital  due to altered mental status and also noted to be significantly tachycardic.  1. Altered mental status-etiology unclear presently. No evidence of acute infectious source and will await urinalysis. -This could be secondary to worsening dementia with delirium. Continue as needed Haldol for agitation. -We'll follow mental status.  2. Atrial tachycardia-patient noted to be significantly tachycardic with heart rates in the 120s to 130s. Unclear this is atrial flutter or fibrillation. -We'll consult cardiology, continue amiodarone drip.   -Cycle cardiac markers keep on telemetry, check a two-dimensional echocardiogram.  3. Hypernatremia-we'll place the patient on D5W follow sodium. -This is likely secondary to dehydration.  4. Hypokalemia-we'll replace potassium. -Check magnesium level.  5. History of seizures-no acute seizure type activity. Continue Keppra, Lamictal.  6. Hyperlipidemia-continue atorvastatin.  7. Hypertension-continue amlodipine.  8. Dementia with behavioral disturbance-continue Namenda. Continue as needed Haldol.    All the records are reviewed and case discussed with ED provider. Management plans discussed with the patient, family and they are in agreement.  CODE STATUS: Full  TOTAL TIME TAKING CARE OF THIS PATIENT: 45 minutes.    Houston Siren M.D on 08/29/2015 at 6:38 PM  Between 7am to 6pm - Pager - [phone]  After 6pm go to [email or web address] - password [email or web address] [facility]  [place] [facility]  Office  [phone]  CC: Primary care physician; No PCP Per Patient

## 2015-08-29 NOTE — Progress Notes (Signed)
Patient is presently lethargic, requested to have keppra given as an IV dose. Orders received from Dr.Johnston.

## 2015-08-29 NOTE — ED Provider Notes (Signed)
[facility] Emergency Department Provider Note  ____________________________________________  Time seen: 3:10 PM on arrival by EMS  I have reviewed the triage vital signs and the nursing notes.   HISTORY  Chief Complaint Altered Mental Status  Level 5 caveat:  Portions of the history and physical were unable to be obtained due to the patient's acute illness and altered mental status   HPI Timothy [name] is a 82 y.o. male brought to the ED for decreased level of consciousness from [facility]. EMS report that the patient has a history of seizures, had run out of his seizure medicine under the care of the care home, but now they have restarted it again. He was off his seizure medicine for about 5 days, and they report that they did not witness any seizure activity during that time. Another the patient has not been eating over the last couple of days and they have tried to encourage him to eat and even assisting with eating but he has not been swallowing it. Seems to have a congested cough. Patient does not report any symptoms. No falls or head trauma.     Past Medical History  Diagnosis Date  . Seizures (HCC)   . TIA (transient ischemic attack)   . Schizophrenia (HCC)   . Hypertension      Patient Active Problem List   Diagnosis Date Noted  . Cerebral vascular disease 06/09/2015  . B12 deficiency 06/09/2015  . Hypokalemia 06/09/2015  . 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 Seizures (HCC) 11/28/2014  . TIA (transient ischemic attack) 11/28/2014  . Schizophrenia (HCC) 11/28/2014  . Hypertension 11/28/2014  . Dyslipidemia 11/28/2014  . Dementia 11/28/2014     History reviewed. No pertinent past surgical history.   Current Outpatient Rx  Name  Route  Sig  Dispense  Refill  . acetaminophen (TYLENOL) 325 MG tablet   Oral   Take 2 tablets (650 mg total) by mouth 2 (two) times daily.   60 tablet   11   . amLODipine (NORVASC) 10 MG  tablet   Oral   Take 1 tablet (10 mg total) by mouth daily.   30 tablet   5   . atorvastatin (LIPITOR) 20 MG tablet   Oral   Take 0.5 tablets (10 mg total) by mouth at bedtime.   30 tablet   5   . doxycycline (VIBRA-TABS) 100 MG tablet   Oral   Take 1 tablet (100 mg total) by mouth 2 (two) times daily.   20 tablet   0   . ergocalciferol (VITAMIN D2) 50000 units capsule   Oral   Take 1 capsule (50,000 Units total) by mouth once a week.   4 capsule   5   . levETIRAcetam (KEPPRA) 750 MG tablet   Oral   Take 2 tablets (1,500 mg total) by mouth 2 (two) times daily.   56 tablet   0     Waiting on mail delivery   . memantine (NAMENDA) 5 MG tablet   Oral   Take 1 tablet (5 mg total) by mouth daily.   30 tablet   0   . potassium chloride SA (K-DUR,KLOR-CON) 20 MEQ tablet   Oral   Take 1 tablet (20 mEq total) by mouth once.   30 tablet   5   . vitamin B-12 (CYANOCOBALAMIN) 1000 MCG tablet   Oral   Take 1 tablet (1,000 mcg total) by mouth daily.   30 tablet  5      Allergies Review of patient's allergies indicates no known allergies.   No family history on file.  Social History Social History  Substance Use Topics  . Smoking status: Current Every Day Smoker -- 5.00 packs/day for 25 years    Types: Cigarettes  . Smokeless tobacco: None  . Alcohol Use: No    Review of Systems Unable to obtain ____________________________________________   PHYSICAL EXAM:  VITAL SIGNS: ED Triage Vitals  Enc Vitals Group     BP 08/29/15 1519 125/85 mmHg     Pulse Rate 08/29/15 1519 140     Resp 08/29/15 1519 21     Temp 08/29/15 1519 97.5 F (36.4 C)     Temp Source 08/29/15 1519 Oral     SpO2 08/29/15 1519 96 %     Weight 08/29/15 1519 160 lb (72.576 kg)     Height 08/29/15 1519 5\' 11"  (1.803 m)     Head Cir --      Peak Flow --      Pain Score 08/29/15 1521 0     Pain Loc --      Pain Edu? --      Excl. in GC? --     Vital signs reviewed, nursing  assessments reviewed.   Constitutional:  Oriented to person and place, ill-appearing. Not in distress Eyes:   No scleral icterus. No conjunctival pallor. PERRL at 1-2 mm bilaterally. EOMI.  No nystagmus. ENT   Head:   Normocephalic and atraumatic.   Nose:   No congestion/rhinnorhea. No septal hematoma   Mouth/Throat:   Dry mucous membranes, no pharyngeal erythema. No peritonsillar mass.    Neck:   No stridor. No SubQ emphysema. No meningismus. Hematological/Lymphatic/Immunilogical:   No cervical lymphadenopathy. Cardiovascular:   Tachycardia heart rate 140. Symmetric bilateral radial and DP pulses.  No murmurs.  Respiratory:   Shallow respirations and poor participation in exam limited auscultation, but breath sounds appear to be clear diffusely. No wheezing or crackles appreciated Gastrointestinal:   Soft and nontender. Non distended. There is no CVA tenderness.  No rebound, rigidity, or guarding. Genitourinary:   deferred Musculoskeletal:   Nontender with normal range of motion in all extremities. No joint effusions.  No lower extremity tenderness.  No edema. Neurologic:   Mumbling but appropriate speech.  CN 2-10 normal. Motor grossly intact. No gross focal neurologic deficits are appreciated.  Skin:    Skin is warm, dry and intact. No rash noted.  No petechiae, purpura, or bullae. Poor skin turgor  ____________________________________________    LABS (pertinent positives/negatives) (all labs ordered are listed, but only abnormal results are displayed) Labs Reviewed  COMPREHENSIVE METABOLIC PANEL - Abnormal; Notable for the following:    Sodium 147 (*)    Potassium 3.3 (*)    Glucose, Bld 115 (*)    BUN 39 (*)    Creatinine, Ser 1.28 (*)    Albumin 3.2 (*)    AST 290 (*)    ALT 136 (*)    GFR calc non Af Amer 51 (*)    GFR calc Af Amer 59 (*)    All other components within normal limits  CBC WITH DIFFERENTIAL/PLATELET - Abnormal; Notable for the following:     Neutro Abs 7.3 (*)    Lymphs Abs 0.9 (*)    Monocytes Absolute 1.1 (*)    All other components within normal limits  PROTIME-INR - Abnormal; Notable for the following:    Prothrombin Time 15.7 (*)  All other components within normal limits  CULTURE, BLOOD (ROUTINE X 2)  CULTURE, BLOOD (ROUTINE X 2)  CULTURE, EXPECTORATED SPUTUM-ASSESSMENT  URINE CULTURE  LACTIC ACID, PLASMA  LIPASE, BLOOD  TROPONIN I  APTT  LACTIC ACID, PLASMA  URINALYSIS COMPLETEWITH MICROSCOPIC ([facility] ONLY)  ETHANOL   ____________________________________________   EKG  Interpreted by me Indeterminate rhythm, likely sinus tachycardia, heart rate 141, left axis, normal intervals. Left bundle branch block. Normal ST segments and T waves.  Repeat EKG at 1646 interpreted by me There are complex tachycardia with left bundle branch block, rate 141, most consistent with atrial flutter. No new ischemic changes. ____________________________________________    RADIOLOGY  CT head unremarkable Chest x-ray unremarkable  ____________________________________________   PROCEDURES CRITICAL CARE Performed by: Scotty Court, Krystale Rinkenberger   Total critical care time: 35 minutes  Critical care time was exclusive of separately billable procedures and treating other patients.  Critical care was necessary to treat or prevent imminent or life-threatening deterioration.  Critical care was time spent personally by me on the following activities: development of treatment plan with patient and/or surrogate as well as nursing, discussions with consultants, evaluation of patient's response to treatment, examination of patient, obtaining history from patient or surrogate, ordering and performing treatments and interventions, ordering and review of laboratory studies, ordering and review of radiographic studies, pulse oximetry and re-evaluation of patient's condition.   ____________________________________________   INITIAL  IMPRESSION / ASSESSMENT AND PLAN / ED COURSE  Pertinent labs & imaging results that were available during my care of the patient were reviewed by me and considered in my medical decision making (see chart for details).  Patient initially presents with tachycardia, decreased level of consciousness, concerning for sepsis. Coughing with congested cough and thick secretions, concerning for pneumonia. Ordered for ceftriaxone and azithromycin and weight-based IV saline bolus resuscitation. Code sepsis initiated, although I did not inform the secretary of this to call the code sepsis to care Link until 5 PM.  Patient's workup actually is entirely unremarkable. CT head and chest x-ray and labs all unremarkable. However, despite IV fluid resuscitation, heart rate remains stable at 140. Patient is looking more alert and interactive after IV fluids and repeat EKG which has less background noise and artifact actually looks like it may be atrial flutter which would be a new diagnosis for him. Try a small dose of IV diltiazem and plan for admission.   ----------------------------------------- 5:37 PM on 08/29/2015 -----------------------------------------  No response to the diltiazem. Heart rate stable at 140. Unclear rhythm. Patient becoming more delirious or giving him IV Haldol for safety. Also start an amiodarone drip and discuss with hospitalist    ____________________________________________   FINAL CLINICAL IMPRESSION(S) / ED DIAGNOSES  Final diagnoses:  New onset atrial fibrillation (HCC)  Atrial fibrillation with RVR (HCC)  Dehydration, moderate       Portions of this note were generated with dragon dictation software. Dictation errors may occur despite best attempts at proofreading.   Sharman Cheek, MD 08/29/15 [phone]

## 2015-08-29 NOTE — ED Notes (Signed)
[facility] States she will check with supervisor to see if patient will have a sitter on the floor. Will call me back.

## 2015-08-29 NOTE — ED Notes (Signed)
Per EMS, staff at care home states patient has decline in health over last month. States today, more AMS than usual.

## 2015-08-30 ENCOUNTER — Inpatient Hospital Stay
Admit: 2015-08-30 | Discharge: 2015-08-30 | Disposition: A | Discharge status: Home / Self Care | Payer: Medicare Other | Attending: Specialist | Admitting: Specialist

## 2015-08-30 ENCOUNTER — Inpatient Hospital Stay: Admit: 2015-08-30 | Payer: Medicare Other

## 2015-08-30 DIAGNOSIS — L8990 Pressure ulcer of unspecified site, unspecified stage: Secondary | ICD-10-CM | POA: Insufficient documentation

## 2015-08-30 LAB — CBC
HCT: 36 % — ABNORMAL LOW (ref 40.0–52.0)
Hemoglobin: 12 g/dL — ABNORMAL LOW (ref 13.0–18.0)
MCH: 27.4 pg (ref 26.0–34.0)
MCHC: 33.5 g/dL (ref 32.0–36.0)
MCV: 82 fL (ref 80.0–100.0)
PLATELETS: 264 10*3/uL (ref 150–440)
RBC: 4.39 MIL/uL — ABNORMAL LOW (ref 4.40–5.90)
RDW: 14.5 % (ref 11.5–14.5)
WBC: 8.7 10*3/uL (ref 3.8–10.6)

## 2015-08-30 LAB — TROPONIN I
TROPONIN I: 0.03 ng/mL (ref <0.031)
Troponin I: 0.04 ng/mL — ABNORMAL HIGH (ref <0.031)

## 2015-08-30 LAB — BASIC METABOLIC PANEL
Anion gap: 9 (ref 5–15)
BUN: 29 mg/dL — AB (ref 6–20)
CHLORIDE: 111 mmol/L (ref 101–111)
CO2: 28 mmol/L (ref 22–32)
CREATININE: 1.14 mg/dL (ref 0.61–1.24)
Calcium: 8.8 mg/dL — ABNORMAL LOW (ref 8.9–10.3)
GFR calc Af Amer: >60 mL/min (ref >60)
GFR, EST NON AFRICAN AMERICAN: 59 mL/min — AB (ref >60)
GLUCOSE: 136 mg/dL — AB (ref 65–99)
Potassium: 2.8 mmol/L — CL (ref 3.5–5.1)
SODIUM: 148 mmol/L — AB (ref 135–145)

## 2015-08-30 LAB — MAGNESIUM: Magnesium: 2.3 mg/dL (ref 1.7–2.4)

## 2015-08-30 MED ORDER — LEVETIRACETAM 100 MG/ML PO SOLN
1500.0000 mg | Freq: Two times a day (BID) | ORAL | Status: DC
  Administered 2015-08-30 – 2015-09-02 (×6): 1500 mg via ORAL
  Filled 2015-08-30 (×7): qty 15

## 2015-08-30 MED ORDER — POTASSIUM CHLORIDE CRYS ER 20 MEQ PO TBCR
40.0000 meq | EXTENDED_RELEASE_TABLET | ORAL | Status: AC
  Administered 2015-08-30 (×2): 40 meq via ORAL
  Filled 2015-08-30 (×2): qty 2

## 2015-08-30 MED ORDER — POTASSIUM CHLORIDE 10 MEQ/100ML IV SOLN
10.0000 meq | INTRAVENOUS | Status: AC
Start: 2015-08-30 — End: 2015-08-30
  Administered 2015-08-30 (×2): 10 meq via INTRAVENOUS
  Filled 2015-08-30 (×2): qty 100

## 2015-08-30 NOTE — Progress Notes (Signed)
Timothy [name] is a 82 y.o. male  [id number]  Primary Cardiologist: Adrian BlackwaterShaukat Janelli Welling Reason for Consultation: Atrial fibrillation  HPI: 82 year old African-American male with a past medical history dementia presented with altered mental status was found to have atrial fibrillation with rapid ventricular response rate and I was asked to evaluate the patient. Patient is unable to offer much history.   Review of Systems: Unable to get review of system   Past Medical History  Diagnosis Date  . Seizures (HCC)   . TIA (transient ischemic attack)   . Schizophrenia (HCC)   . Hypertension     Medications Prior to Admission  Medication Sig Dispense Refill  . acetaminophen (TYLENOL) 325 MG tablet Take 2 tablets (650 mg total) by mouth 2 (two) times daily. 60 tablet 11  . amLODipine (NORVASC) 10 MG tablet Take 1 tablet (10 mg total) by mouth daily. 30 tablet 5  . atorvastatin (LIPITOR) 20 MG tablet Take 0.5 tablets (10 mg total) by mouth at bedtime. 30 tablet 5  . cholecalciferol (VITAMIN D) 1000 units tablet Take 1,000 Units by mouth daily.    Marland Kitchen. lamoTRIgine (LAMICTAL) 150 MG tablet Take 150 mg by mouth 2 (two) times daily.    Marland Kitchen. levETIRAcetam (KEPPRA) 750 MG tablet Take 2 tablets (1,500 mg total) by mouth 2 (two) times daily. 56 tablet 0  . memantine (NAMENDA) 5 MG tablet Take 1 tablet (5 mg total) by mouth daily. 30 tablet 0  . potassium chloride SA (K-DUR,KLOR-CON) 20 MEQ tablet Take 1 tablet (20 mEq total) by mouth once. (Patient taking differently: Take 20 mEq by mouth daily. ) 30 tablet 5  . vitamin B-12 (CYANOCOBALAMIN) 1000 MCG tablet Take 1 tablet (1,000 mcg total) by mouth daily. 30 tablet 5  . doxycycline (VIBRA-TABS) 100 MG tablet Take 1 tablet (100 mg total) by mouth 2 (two) times daily. 20 tablet 0  . ergocalciferol (VITAMIN D2) 50000 units capsule Take 1 capsule (50,000 Units total) by mouth once a week. 4 capsule 5     . amLODipine  10 mg Oral Daily  . atorvastatin  10 mg Oral  QHS  . cholecalciferol  1,000 Units Oral Daily  . enoxaparin (LOVENOX) injection  40 mg Subcutaneous Q24H  . lamoTRIgine  150 mg Oral BID  . levETIRAcetam  1,500 mg Oral BID  . memantine  5 mg Oral Daily  . potassium chloride SA  20 mEq Oral Daily  . sodium chloride flush  3 mL Intravenous Q12H  . vitamin B-12  1,000 mcg Oral Daily    Infusions: . amiodarone 30 mg/hr (08/30/15 0741)  . dextrose 5 % with KCl 20 mEq / L 20 mEq (08/29/15 2300)    No Known Allergies  Social History   Social History  . Marital Status: Widowed    Spouse Name: N/A  . Number of Children: N/A  . Years of Education: N/A   Occupational History  . Not on file.   Social History Main Topics  . Smoking status: Current Every Day Smoker -- 5.00 packs/day for 25 years    Types: Cigarettes  . Smokeless tobacco: Not on file  . Alcohol Use: No  . Drug Use: No  . Sexual Activity: Not Currently   Other Topics Concern  . Not on file   Social History Narrative    No family history on file.  PHYSICAL EXAM: Filed Vitals:   08/30/15 0414 08/30/15 0944  BP: 102/62 108/54  Pulse: 76 73  Temp: 98.1  F (36.7 C)   Resp: 16      Intake/Output Summary (Last 24 hours) at 08/30/15 1111 Last data filed at 08/30/15 0900  Gross per 24 hour  Intake  863.8 ml  Output    500 ml  Net  363.8 ml    General:  Well appearing. No respiratory difficulty HEENT: normal Neck: supple. no JVD. Carotids 2+ bilat; no bruits. No lymphadenopathy or thryomegaly appreciated. Cor: PMI nondisplaced. Regular rate & rhythm. No rubs, gallops or murmurs. Lungs: clear Abdomen: soft, nontender, nondistended. No hepatosplenomegaly. No bruits or masses. Good bowel sounds. Extremities: no cyanosis, clubbing, rash, edema Neuro: alert & oriented x 3, cranial nerves grossly intact. moves all 4 extremities w/o difficulty. Affect pleasant.  ZOX:WRUE bundle branch block with wide complex tachycardia most likely atrial fibrillation with  rapid ventricular response rate  Results for orders placed or performed during the hospital encounter of 08/29/15 (from the past 24 hour(s))  Lactic acid, plasma     Status: None   Collection Time: 08/29/15  3:35 PM  Result Value Ref Range   Lactic Acid, Venous 1.5 0.5 - 2.0 mmol/L  Comprehensive metabolic panel     Status: Abnormal   Collection Time: 08/29/15  3:35 PM  Result Value Ref Range   Sodium 147 (H) 135 - 145 mmol/L   Potassium 3.3 (L) 3.5 - 5.1 mmol/L   Chloride 108 101 - 111 mmol/L   CO2 30 22 - 32 mmol/L   Glucose, Bld 115 (H) 65 - 99 mg/dL   BUN 39 (H) 6 - 20 mg/dL   Creatinine, Ser 4.54 (H) 0.61 - 1.24 mg/dL   Calcium 9.4 8.9 - 09.8 mg/dL   Total Protein 7.6 6.5 - 8.1 g/dL   Albumin 3.2 (L) 3.5 - 5.0 g/dL   AST 119 (H) 15 - 41 U/L   ALT 136 (H) 17 - 63 U/L   Alkaline Phosphatase 71 38 - 126 U/L   Total Bilirubin 1.2 0.3 - 1.2 mg/dL   GFR calc non Af Amer 51 (L) >60 mL/min   GFR calc Af Amer 59 (L) >60 mL/min   Anion gap 9 5 - 15  Lipase, blood     Status: None   Collection Time: 08/29/15  3:35 PM  Result Value Ref Range   Lipase 16 11 - 51 U/L  Troponin I     Status: None   Collection Time: 08/29/15  3:35 PM  Result Value Ref Range   Troponin I <0.03 <0.031 ng/mL  CBC WITH DIFFERENTIAL     Status: Abnormal   Collection Time: 08/29/15  3:35 PM  Result Value Ref Range   WBC 9.4 3.8 - 10.6 K/uL   RBC 5.07 4.40 - 5.90 MIL/uL   Hemoglobin 13.9 13.0 - 18.0 g/dL   HCT 14.7 82.9 - 56.2 %   MCV 83.6 80.0 - 100.0 fL   MCH 27.4 26.0 - 34.0 pg   MCHC 32.8 32.0 - 36.0 g/dL   RDW 13.0 86.5 - 78.4 %   Platelets 295 150 - 440 K/uL   Neutrophils Relative % 77 %   Neutro Abs 7.3 (H) 1.4 - 6.5 K/uL   Lymphocytes Relative 10 %   Lymphs Abs 0.9 (L) 1.0 - 3.6 K/uL   Monocytes Relative 12 %   Monocytes Absolute 1.1 (H) 0.2 - 1.0 K/uL   Eosinophils Relative 1 %   Eosinophils Absolute 0.1 0 - 0.7 K/uL   Basophils Relative 0 %   Basophils Absolute  0.0 0 - 0.1 K/uL  APTT      Status: None   Collection Time: 08/29/15  3:35 PM  Result Value Ref Range   aPTT 27 24 - 36 seconds  Protime-INR     Status: Abnormal   Collection Time: 08/29/15  3:35 PM  Result Value Ref Range   Prothrombin Time 15.7 (H) 11.4 - 15.0 seconds   INR 1.23   Blood Culture (routine x 2)     Status: None (Preliminary result)   Collection Time: 08/29/15  3:35 PM  Result Value Ref Range   Specimen Description BLOOD RIGHT ANTECUBITAL    Special Requests BOTTLES DRAWN AEROBIC AND ANAEROBIC  2CC    Culture NO GROWTH < 24 HOURS    Report Status PENDING   Ethanol     Status: None   Collection Time: 08/29/15  3:35 PM  Result Value Ref Range   Alcohol, Ethyl (B) <5 <5 mg/dL  TSH     Status: None   Collection Time: 08/29/15  3:35 PM  Result Value Ref Range   TSH 2.183 0.350 - 4.500 uIU/mL  T4, free     Status: Abnormal   Collection Time: 08/29/15  3:35 PM  Result Value Ref Range   Free T4 1.34 (H) 0.61 - 1.12 ng/dL  Magnesium     Status: Abnormal   Collection Time: 08/29/15  3:35 PM  Result Value Ref Range   Magnesium 2.6 (H) 1.7 - 2.4 mg/dL  Phosphorus     Status: None   Collection Time: 08/29/15  3:35 PM  Result Value Ref Range   Phosphorus 2.7 2.5 - 4.6 mg/dL  Blood Culture (routine x 2)     Status: None (Preliminary result)   Collection Time: 08/29/15  3:38 PM  Result Value Ref Range   Specimen Description BLOOD LEFT FOREARM    Special Requests BOTTLES DRAWN AEROBIC AND ANAEROBIC  1CC    Culture NO GROWTH < 24 HOURS    Report Status PENDING   Lactic acid, plasma     Status: None   Collection Time: 08/29/15  7:50 PM  Result Value Ref Range   Lactic Acid, Venous 1.4 0.5 - 2.0 mmol/L  Urinalysis complete, with microscopic ([facility] only)     Status: Abnormal   Collection Time: 08/29/15  8:05 PM  Result Value Ref Range   Color, Urine AMBER (A) YELLOW   APPearance CLEAR (A) CLEAR   Glucose, UA NEGATIVE NEGATIVE mg/dL   Bilirubin Urine NEGATIVE NEGATIVE   Ketones, ur NEGATIVE  NEGATIVE mg/dL   Specific Gravity, Urine 1.024 1.005 - 1.030   Hgb urine dipstick NEGATIVE NEGATIVE   pH 5.0 5.0 - 8.0   Protein, ur 30 (A) NEGATIVE mg/dL   Nitrite NEGATIVE NEGATIVE   Leukocytes, UA NEGATIVE NEGATIVE   RBC / HPF NONE SEEN 0 - 5 RBC/hpf   WBC, UA NONE SEEN 0 - 5 WBC/hpf   Bacteria, UA NONE SEEN NONE SEEN   Squamous Epithelial / LPF NONE SEEN NONE SEEN  Culture, sputum-assessment     Status: None   Collection Time: 08/29/15  9:35 PM  Result Value Ref Range   Specimen Description EXPECTORATED SPUTUM    Special Requests NONE    Sputum evaluation THIS SPECIMEN IS ACCEPTABLE FOR SPUTUM CULTURE    Report Status 08/29/2015 FINAL   Surgical pcr screen     Status: None   Collection Time: 08/29/15 10:33 PM  Result Value Ref Range   MRSA, PCR NEGATIVE NEGATIVE  Staphylococcus aureus NEGATIVE NEGATIVE  Troponin I     Status: None   Collection Time: 08/29/15 10:36 PM  Result Value Ref Range   Troponin I <0.03 <0.031 ng/mL  Basic metabolic panel     Status: Abnormal   Collection Time: 08/30/15  2:32 AM  Result Value Ref Range   Sodium 148 (H) 135 - 145 mmol/L   Potassium 2.8 (LL) 3.5 - 5.1 mmol/L   Chloride 111 101 - 111 mmol/L   CO2 28 22 - 32 mmol/L   Glucose, Bld 136 (H) 65 - 99 mg/dL   BUN 29 (H) 6 - 20 mg/dL   Creatinine, Ser 1.61 0.61 - 1.24 mg/dL   Calcium 8.8 (L) 8.9 - 10.3 mg/dL   GFR calc non Af Amer 59 (L) >60 mL/min   GFR calc Af Amer >60 >60 mL/min   Anion gap 9 5 - 15  CBC     Status: Abnormal   Collection Time: 08/30/15  2:32 AM  Result Value Ref Range   WBC 8.7 3.8 - 10.6 K/uL   RBC 4.39 (L) 4.40 - 5.90 MIL/uL   Hemoglobin 12.0 (L) 13.0 - 18.0 g/dL   HCT 09.6 (L) 04.5 - 40.9 %   MCV 82.0 80.0 - 100.0 fL   MCH 27.4 26.0 - 34.0 pg   MCHC 33.5 32.0 - 36.0 g/dL   RDW 81.1 91.4 - 78.2 %   Platelets 264 150 - 440 K/uL  Magnesium     Status: None   Collection Time: 08/30/15  2:32 AM  Result Value Ref Range   Magnesium 2.3 1.7 - 2.4 mg/dL  Troponin  I     Status: Abnormal   Collection Time: 08/30/15  2:32 AM  Result Value Ref Range   Troponin I 0.04 (H) <0.031 ng/mL  Troponin I     Status: None   Collection Time: 08/30/15  6:24 AM  Result Value Ref Range   Troponin I 0.03 <0.031 ng/mL   Ct Head Wo Contrast  08/29/2015  CLINICAL DATA:  Per staff at care home: states patient has decline in health over last month. States today, more AMS than usual. EXAM: CT HEAD WITHOUT CONTRAST TECHNIQUE: Contiguous axial images were obtained from the base of the skull through the vertex without intravenous contrast. COMPARISON:  08/15/2015. FINDINGS: The ventricles are normal in configuration. There is age related ventricular and sulcal enlargement, stable from the prior study. There are no parenchymal masses or mass effect. There is no evidence of a recent infarct. Mild periventricular white matter hypoattenuation is noted consistent with chronic microvascular ischemic change. There are no extra-axial masses or abnormal fluid collections. There is no intracranial hemorrhage. There is an old medial left orbital fracture, stable visualized sinuses and mastoid air cells are clear. IMPRESSION: 1. No acute intracranial abnormalities. No change from the recent prior study. Electronically Signed   By: Amie Portland M.D.   On: 08/29/2015 16:20   Dg Chest Port 1 View  08/29/2015  CLINICAL DATA:  Altered mental status.  Cough. EXAM: PORTABLE CHEST 1 VIEW COMPARISON:  Radiograph of August 15, 2015. FINDINGS: The heart size and mediastinal contours are within normal limits. No pneumothorax or pleural effusion is noted. Left lung is clear. Minimal right basilar subsegmental atelectasis is noted. The visualized skeletal structures are unremarkable. IMPRESSION: Minimal right basilar subsegmental atelectasis. Electronically Signed   By: Lupita Raider, M.D.   On: 08/29/2015 16:15     ASSESSMENT AND PLAN: Patient appears to  have atrial fibrillation with left bundle branch block  and amiodarone was started and appears to be back in sinus rhythm. Tomorrow we will discontinue amiodarone as has history of liver disease.  Rodolphe Edmonston A

## 2015-08-30 NOTE — Progress Notes (Signed)
[facility] [place] at [facility]   PATIENT NAME: Timothy [name]    MR#:  [id number]  DATE OF BIRTH:  [birth year]  SUBJECTIVE:  CHIEF COMPLAINT:   Chief Complaint  Patient presents with  . Altered Mental Status   - Patient with advanced dementia, schizophrenia and seizures admitted with altered mental status from group home. -Alert and following some commands today. Was noted to be in atrial fibrillation and started on amiodarone drip. -Converted to normal sinus rhythm last night.  REVIEW OF SYSTEMS:  Review of Systems  Unable to perform ROS: medical condition    DRUG ALLERGIES:  No Known Allergies  VITALS:  Blood pressure 102/62, pulse 76, temperature 98.1 F (36.7 C), temperature source Oral, resp. rate 16, height 5\' 11"  (1.803 m), weight 65.817 kg (145 lb 1.6 oz), SpO2 100 %.  PHYSICAL EXAMINATION:  Physical Exam  GENERAL:  82 y.o.-year-old patient lying in the bed with no acute distress. Disheveled appearing EYES: Pupils equal, round, reactive to light and accommodation. No scleral icterus. Extraocular muscles intact.  HEENT: Head atraumatic, normocephalic. Oropharynx and nasopharynx clear.  NECK:  Supple, no jugular venous distention. No thyroid enlargement, no tenderness.  LUNGS: Normal breath sounds bilaterally, no wheezing, rales,rhonchi or crepitation. No use of accessory muscles of respiration. Decreased bibasilar breath sounds CARDIOVASCULAR: S1, S2 normal. No murmurs, rubs, or gallops.  ABDOMEN: Soft, nontender, nondistended. Bowel sounds present. No organomegaly or mass.  EXTREMITIES: No pedal edema, cyanosis, or clubbing. Right hand in mitten NEUROLOGIC:  Awake and alert, following simple commands, moving both arms easily, weakness in lower extremities noted but still 3/5 strength PSYCHIATRIC: The patient is alert and oriented to self.  SKIN: No obvious rash, lesion, or ulcer.    LABORATORY PANEL:   CBC  Recent Labs Lab  08/30/15 0232  WBC 8.7  HGB 12.0*  HCT 36.0*  PLT 264   ------------------------------------------------------------------------------------------------------------------  Chemistries   Recent Labs Lab 08/29/15 1535 08/30/15 0232  NA 147* 148*  K 3.3* 2.8*  CL 108 111  CO2 30 28  GLUCOSE 115* 136*  BUN 39* 29*  CREATININE 1.28* 1.14  CALCIUM 9.4 8.8*  MG 2.6* 2.3  AST 290*  --   ALT 136*  --   ALKPHOS 71  --   BILITOT 1.2  --    ------------------------------------------------------------------------------------------------------------------  Cardiac Enzymes  Recent Labs Lab 08/30/15 0624  TROPONINI 0.03   ------------------------------------------------------------------------------------------------------------------  RADIOLOGY:  Ct Head Wo Contrast  08/29/2015  CLINICAL DATA:  Per staff at care home: states patient has decline in health over last month. States today, more AMS than usual. EXAM: CT HEAD WITHOUT CONTRAST TECHNIQUE: Contiguous axial images were obtained from the base of the skull through the vertex without intravenous contrast. COMPARISON:  08/15/2015. FINDINGS: The ventricles are normal in configuration. There is age related ventricular and sulcal enlargement, stable from the prior study. There are no parenchymal masses or mass effect. There is no evidence of a recent infarct. Mild periventricular white matter hypoattenuation is noted consistent with chronic microvascular ischemic change. There are no extra-axial masses or abnormal fluid collections. There is no intracranial hemorrhage. There is an old medial left orbital fracture, stable visualized sinuses and mastoid air cells are clear. IMPRESSION: 1. No acute intracranial abnormalities. No change from the recent prior study. Electronically Signed   By: Amie Portlandavid  Ormond M.D.   On: 08/29/2015 16:20   Dg Chest Port 1 View  08/29/2015  CLINICAL DATA:  Altered mental status.  Cough. EXAM: PORTABLE CHEST 1  VIEW COMPARISON:  Radiograph of August 15, 2015. FINDINGS: The heart size and mediastinal contours are within normal limits. No pneumothorax or pleural effusion is noted. Left lung is clear. Minimal right basilar subsegmental atelectasis is noted. The visualized skeletal structures are unremarkable. IMPRESSION: Minimal right basilar subsegmental atelectasis. Electronically Signed   By: Lupita Raider, M.D.   On: 08/29/2015 16:15    EKG:   Orders placed or performed during the hospital encounter of 08/29/15  . EKG 12-Lead  . EKG 12-Lead    ASSESSMENT AND PLAN:   82 year old male with history of dementia, seizures, schizophrenia and hypertension presented from group home for change in mental status and also atrial fibrillation  #1 atrial fibrillation-new-onset. Started on amiodarone drip. Converted to normal sinus rhythm last night. -Continue amiodarone drip and changed to oral amiodarone today. -Cardiology consult pending. Echocardiogram today. -Correct electrolytes -TSH within normal limits. Slightly elevated free T4. Patient not on any thyroid supplements. Monitor and recheck in a few months.  #2 hypokalemia-being replaced with potassium.  #3 seizure disorder-continue Keppra and Lamictal.  #4 hypertension-continue Norvasc  #5 dementia-not sure what his baseline is. Currently oriented to self. Continue Namenda. History of behavioral disturbance.  -Physical therapy consult when able to.    All the records are reviewed and case discussed with Care Management/Social Workerr. Management plans discussed with the patient, family and they are in agreement.  CODE STATUS: Full Code  TOTAL TIME TAKING CARE OF THIS PATIENT: 37 minutes.   POSSIBLE D/C IN 2 DAYS, DEPENDING ON CLINICAL CONDITION.   Enid Baas M.D on 08/30/2015 at 8:54 AM  Between 7am to 6pm - Pager - [phone]  After 6pm go to [email or web address] - password [email or web address] [facility]  [place] [facility]  Office   [phone]  CC: Primary care physician; No PCP Per Patient

## 2015-08-30 NOTE — Progress Notes (Signed)
*  PRELIMINARY RESULTS* Echocardiogram 2D Echocardiogram has been performed.  Timothy [name] 08/30/2015, 12:10 PM

## 2015-08-30 NOTE — Progress Notes (Signed)
Notified Dr. Sheryle Hailiamond of potassium level of 2.8, new orders received.

## 2015-08-31 ENCOUNTER — Inpatient Hospital Stay: Payer: Medicare Other

## 2015-08-31 LAB — ECHOCARDIOGRAM COMPLETE
AOPV: 0.69 m/s
AV Area VTI: 2.4 cm2
AV Peak grad: 6 mmHg
AV peak Index: 1.3
AV pk vel: 125 cm/s
E decel time: 158 msec
FS: 20 % — AB (ref 28–44)
HEIGHTINCHES: 71 in
IVS/LV PW RATIO, ED: 1.12
LA ID, A-P, ES: 31 mm
LA diam index: 1.68 cm/m2
LA vol index: 27 mL/m2
LAVOL: 49.6 mL
LAVOLA4C: 38.3 mL
LEFT ATRIUM END SYS DIAM: 31 mm
LVOT area: 3.46 cm2
LVOT diameter: 21 mm
LVOTPV: 86.8 cm/s
MV Dec: 158
MV pk E vel: 84.4 m/s
MVPG: 3 mmHg
MVPKAVEL: 60.1 m/s
Mean grad: 244 mmHg
PW: 8.32 mm — AB (ref 0.6–1.1)
Weight: 2321.6 oz

## 2015-08-31 LAB — BASIC METABOLIC PANEL
Anion gap: 5 (ref 5–15)
BUN: 23 mg/dL — AB (ref 6–20)
CALCIUM: 8.7 mg/dL — AB (ref 8.9–10.3)
CO2: 27 mmol/L (ref 22–32)
CREATININE: 1.15 mg/dL (ref 0.61–1.24)
Chloride: 113 mmol/L — ABNORMAL HIGH (ref 101–111)
GFR calc Af Amer: >60 mL/min (ref >60)
GFR, EST NON AFRICAN AMERICAN: 58 mL/min — AB (ref >60)
GLUCOSE: 98 mg/dL (ref 65–99)
Potassium: 3.9 mmol/L (ref 3.5–5.1)
SODIUM: 145 mmol/L (ref 135–145)

## 2015-08-31 LAB — URINE CULTURE

## 2015-08-31 LAB — CBC
HEMATOCRIT: 33.7 % — AB (ref 40.0–52.0)
Hemoglobin: 11.2 g/dL — ABNORMAL LOW (ref 13.0–18.0)
MCH: 27.5 pg (ref 26.0–34.0)
MCHC: 33.3 g/dL (ref 32.0–36.0)
MCV: 82.6 fL (ref 80.0–100.0)
PLATELETS: 242 10*3/uL (ref 150–440)
RBC: 4.09 MIL/uL — ABNORMAL LOW (ref 4.40–5.90)
RDW: 14.4 % (ref 11.5–14.5)
WBC: 7.6 10*3/uL (ref 3.8–10.6)

## 2015-08-31 MED ORDER — PIPERACILLIN-TAZOBACTAM 3.375 G IVPB
3.3750 g | Freq: Three times a day (TID) | INTRAVENOUS | Status: DC
  Administered 2015-08-31 – 2015-09-02 (×7): 3.375 g via INTRAVENOUS
  Filled 2015-08-31 (×9): qty 50

## 2015-08-31 MED ORDER — AMIODARONE HCL 200 MG PO TABS
400.0000 mg | ORAL_TABLET | Freq: Every day | ORAL | Status: DC
Start: 2015-08-31 — End: 2015-09-02
  Administered 2015-08-31 – 2015-09-02 (×3): 400 mg via ORAL
  Filled 2015-08-31 (×3): qty 2

## 2015-08-31 NOTE — Care Management (Signed)
Patient's legal guardian is Hazle CocaVonda [place] [facility] [employer] [place]  She will be faxing the guardian papers to nursing unit.    Gave permission to initiate a bed search for skilled nursing

## 2015-08-31 NOTE — Progress Notes (Addendum)
[facility] -  at [facility]   PATIENT NAME: Timothy [name]    MR#:  [id number]  DATE OF BIRTH:  1932/12/04  SUBJECTIVE:  CHIEF COMPLAINT:   Chief Complaint  Patient presents with  . Altered Mental Status   - Patient with advanced dementia, schizophrenia and seizures admitted with altered mental status from group home. -Patient had ran out of his seizure medications prior to admission. Restarted here in the hospital. Mental status is back to baseline. -Having cough with productive sputum. Speech evaluation pending today. Also physical therapy evaluation pending today  REVIEW OF SYSTEMS:  Review of Systems  Unable to perform ROS: dementia    DRUG ALLERGIES:  No Known Allergies  VITALS:  Blood pressure 103/64, pulse 69, temperature 98.2 F (36.8 C), temperature source Oral, resp. rate 20, height  (1.803 m), weight 65.817 kg (145 lb 1.6 oz), SpO2 97 %.  PHYSICAL EXAMINATION:  Physical Exam  GENERAL:  82 y.o.-year-old patient lying in the bed with no acute distress. Disheveled appearing EYES: Pupils equal, round, reactive to light and accommodation. No scleral icterus. Extraocular muscles intact.  HEENT: Head atraumatic, normocephalic. Oropharynx and nasopharynx clear.  NECK:  Supple, no jugular venous distention. No thyroid enlargement, no tenderness.  LUNGS: Normal breath sounds bilaterally, no wheezing, rales,rhonchi or crepitation. No use of accessory muscles of respiration. Decreased bibasilar breath sounds CARDIOVASCULAR: S1, S2 normal. No murmurs, rubs, or gallops.  ABDOMEN: Soft, nontender, nondistended. Bowel sounds present. No organomegaly or mass.  EXTREMITIES: No pedal edema, cyanosis, or clubbing. Right hand in mitten NEUROLOGIC:  Awake and alert, following simple commands, moving both arms easily, weakness in lower extremities noted but still 3/5 strength PSYCHIATRIC: The patient is alert and oriented to self.  SKIN: No  obvious rash, lesion, or ulcer.    LABORATORY PANEL:   CBC  Recent Labs Lab 08/31/15 0453  WBC 7.6  HGB 11.2*  HCT 33.7*  PLT 242   ------------------------------------------------------------------------------------------------------------------  Chemistries   Recent Labs Lab 08/29/15 1535 08/30/15 0232 08/31/15 0453  NA 147* 148* 145  K 3.3* 2.8* 3.9  CL 108 111 113*  CO2 GLUCOSE 115* 136* 98  BUN 39* 29* 23*  CREATININE 1.28* 1.14 1.15  CALCIUM 9.4 8.8* 8.7*  MG 2.6* 2.3  --   AST 290*  --   --   ALT 136*  --   --   ALKPHOS 71  --   --   BILITOT 1.2  --   --    ------------------------------------------------------------------------------------------------------------------  Cardiac Enzymes  Recent Labs Lab 08/30/15 0624  TROPONINI 0.03   ------------------------------------------------------------------------------------------------------------------  RADIOLOGY:  Ct Head Wo Contrast  08/29/2015  CLINICAL DATA:  Per staff at care home: states patient has decline in health over last month. States today, more AMS than usual. EXAM: CT HEAD WITHOUT CONTRAST TECHNIQUE: Contiguous axial images were obtained from the base of the skull through the vertex without intravenous contrast. COMPARISON:  08/15/2015. FINDINGS: The ventricles are normal in configuration. There is age related ventricular and sulcal enlargement, stable from the prior study. There are no parenchymal masses or mass effect. There is no evidence of a recent infarct. Mild periventricular white matter hypoattenuation is noted consistent with chronic microvascular ischemic change. There are no extra-axial masses or abnormal fluid collections. There is no intracranial hemorrhage. There is an old medial left orbital fracture, stable visualized sinuses and mastoid air cells are clear. IMPRESSION: 1. No acute intracranial abnormalities. No change  from the recent prior study. Electronically Signed    By: Amie Portlandavid  Ormond M.D.   On: 08/29/2015 16:20   Dg Chest Port 1 View  08/29/2015  CLINICAL DATA:  Altered mental status.  Cough. EXAM: PORTABLE CHEST 1 VIEW COMPARISON:  Radiograph of August 15, 2015. FINDINGS: The heart size and mediastinal contours are within normal limits. No pneumothorax or pleural effusion is noted. Left lung is clear. Minimal right basilar subsegmental atelectasis is noted. The visualized skeletal structures are unremarkable. IMPRESSION: Minimal right basilar subsegmental atelectasis. Electronically Signed   By: Lupita RaiderJames  Green Jr, M.D.   On: 08/29/2015 16:15    EKG:   Orders placed or performed during the hospital encounter of 08/29/15  . EKG 12-Lead  . EKG 12-Lead    ASSESSMENT AND PLAN:   82 year old male with history of dementia, seizures, schizophrenia and hypertension presented from group home for change in mental status and also atrial fibrillation  #1 atrial fibrillation-new-onset. on amiodarone drip. Converted to normal sinus rhythm. -Discontinue amiodarone today -Cardiology consult appreciated. Echocardiogram with EF of 40%. -Correct electrolytes -TSH within normal limits. Slightly elevated free T4. Patient not on any thyroid supplements. Monitor and recheck in a few months.  #2 dysphagia-coughing with eating and has thick productive sputum. Sputum culture sent for. -Started on Zosyn for possible aspiration. Follow-up chest x-ray tomorrow. Speech therapy consult pending today..  #3 seizure disorder-continue Keppra and Lamictal.  #4 hypertension-continue Norvasc  #5 advanced dementia-pleasantly confused, Currently oriented to self. Continue Namenda. Following commands. History of behavioral disturbance.  -Physical therapy consulted. Anticipate discharge tomorrow on oral antibiotics.    All the records are reviewed and case discussed with Care Management/Social Workerr. Management plans discussed with the patient, family and they are in  agreement.  CODE STATUS: Full Code  TOTAL TIME TAKING CARE OF THIS PATIENT: 33 minutes.   POSSIBLE D/C IN 1 day, DEPENDING ON CLINICAL CONDITION.   Enid BaasKALISETTI,Jameela Michna M.D on 08/31/2015 at 8:44 AM  Between 7am to 6pm - Pager - [phone]  After 6pm go to [email or web address] - password [email or web address] [facility]  [facility] [place] [facility]  Office  [phone]  CC: Primary care physician; No PCP Per Patient

## 2015-08-31 NOTE — Progress Notes (Signed)
SUBJECTIVE: Patient is more alert today but denies any symptoms such as chest pain or shortness of breath   Filed Vitals:   08/30/15 1934 08/31/15 0521 08/31/15 0934 08/31/15 1112  BP:  103/64 105/58 96/60  Pulse:  69 68 68  Temp: 97.8 F (36.6 C) 98.2 F (36.8 C)  98.2 F (36.8 C)  TempSrc: Oral Oral  Oral  Resp: 20 20  18   Height:      Weight:      SpO2: 100% 97%  99%    Intake/Output Summary (Last 24 hours) at 08/31/15 1158 Last data filed at 08/31/15 0900  Gross per 24 hour  Intake    240 ml  Output    250 ml  Net    -10 ml    LABS: Basic Metabolic Panel:  Recent Labs  16/12/9604/17/17 1535 08/30/15 0232 08/31/15 0453  NA 147* 148* 145  K 3.3* 2.8* 3.9  CL 108 111 113*  CO2 30 28 27   GLUCOSE 115* 136* 98  BUN 39* 29* 23*  CREATININE 1.28* 1.14 1.15  CALCIUM 9.4 8.8* 8.7*  MG 2.6* 2.3  --   PHOS 2.7  --   --    Liver Function Tests:  Recent Labs  08/29/15 1535  AST 290*  ALT 136*  ALKPHOS 71  BILITOT 1.2  PROT 7.6  ALBUMIN 3.2*    Recent Labs  08/29/15 1535  LIPASE 16   CBC:  Recent Labs  08/29/15 1535 08/30/15 0232 08/31/15 0453  WBC 9.4 8.7 7.6  NEUTROABS 7.3*  --   --   HGB 13.9 12.0* 11.2*  HCT 42.4 36.0* 33.7*  MCV 83.6 82.0 82.6  PLT 295 264 242   Cardiac Enzymes:  Recent Labs  08/29/15 2236 08/30/15 0232 08/30/15 0624  TROPONINI <0.03 0.04* 0.03   BNP: Invalid input(s): POCBNP D-Dimer: No results for input(s): DDIMER in the last 72 hours. Hemoglobin A1C: No results for input(s): HGBA1C in the last 72 hours. Fasting Lipid Panel: No results for input(s): CHOL, HDL, LDLCALC, TRIG, CHOLHDL, LDLDIRECT in the last 72 hours. Thyroid Function Tests:  Recent Labs  08/29/15 1535  TSH 2.183   Anemia Panel: No results for input(s): VITAMINB12, FOLATE, FERRITIN, TIBC, IRON, RETICCTPCT in the last 72 hours.   PHYSICAL EXAM General: Well developed, well nourished, in no acute distress HEENT:  Normocephalic and  atramatic Neck:  No JVD.  Lungs: Clear bilaterally to auscultation and percussion. Heart: HRRR . Normal S1 and S2 without gallops or murmurs.  Abdomen: Bowel sounds are positive, abdomen soft and non-tender  Msk:  Back normal, normal gait. Normal strength and tone for age. Extremities: No clubbing, cyanosis or edema.   Neuro: Alert and oriented X 3. Psych:  Good affect, responds appropriately  TELEMETRY:Sinus rhythm  ASSESSMENT AND PLAN: Atrial fibrillation with rapid ventricular response rate now back in sinus rhythm advise by mouth amiodarone 400 mg by mouth daily and eventually weaning off to 200 mg by mouth daily.  Active Problems:   Atrial fibrillation and flutter (HCC)   Pressure ulcer    Damani Kelemen A, MD, [facility] 08/31/2015 11:58 AM

## 2015-08-31 NOTE — Plan of Care (Signed)
Problem: Education: Goal: Knowledge of disease or condition will improve Outcome: Not Progressing Patient is unaware of initial admission of afib with elevated heart rate. Patient is currently NSR on the monitor.

## 2015-08-31 NOTE — Progress Notes (Signed)
Patient's overall disposition has improved. Patient is communicating with staff, answering questions appropriately. Occassional garbled speech. Patient is moving in bed, not longer require staff to physically turn patient.

## 2015-08-31 NOTE — Progress Notes (Signed)
Pharmacy Antibiotic Note  Timothy [name] is a 82 y.o. male admitted on 08/29/2015 with possible aspiration pneumonia.  Pharmacy has been consulted for Zosyn dosing.  Plan: Zosyn 3.375g IV q8h (4 hour infusion).  Height: 5\' 11"  (180.3 cm) Weight: 145 lb 1.6 oz (65.817 kg) IBW/kg (Calculated) : 75.3  Temp (24hrs), Avg:98 F (36.7 C), Min:97.8 F (36.6 C), Max:98.2 F (36.8 C)   Recent Labs Lab 08/29/15 1535 08/29/15 1950 08/30/15 0232 08/31/15 0453  WBC 9.4  --  8.7 7.6  CREATININE 1.28*  --  1.14 1.15  LATICACIDVEN 1.5 1.4  --   --     Estimated Creatinine Clearance: 47.7 mL/min (by C-G formula based on Cr of 1.15).    No Known Allergies  Antimicrobials this admission: Zosyn 6/19 >>    Dose adjustments this admission:  Microbiology results: 6/17 BCx: NGTD 6/17 UCx: sent  6/17 Sputum: pending  6/17 MRSA PCR: neg  Thank you for allowing pharmacy to be a part of this patient's care.  Marty HeckWang, Cru Kritikos L 08/31/2015 8:56 AM

## 2015-08-31 NOTE — Care Management Important Message (Addendum)
Important Message  Patient Details  Name: Timothy [name] MRN: [id number] Date of Birth: 1932/12/13   Medicare Important Message Given:   Yes    [name], [place] R, RN 08/31/2015, 8:31 AM

## 2015-08-31 NOTE — Care Management (Signed)
Patient is resident of [facility]- and assisted living care home.  Patient is confused.  In[place] and "I don't know how i got here."  Does not  seem oriented to place.  spoke with Renae FicklePaul from [facility] [facility].  Patient has been resident for over one year. Until a week and a half ago, he was "walking pretty good."  He was being provided a soft diet but he was coughing during meals when he swallowed.  Renae Fickleaul was afraid patient was aspirating.  Patient has a guardian through [place] [facility] [place] [facility].  CM left voicemail message with adult services to determine name of guardian.  Updated CSW.  At present, anticipate need for short term skilled nursing

## 2015-08-31 NOTE — Progress Notes (Signed)
Physical Therapy Evaluation Patient Details Name: Timothy [name] MRN: [id number] DOB: 1932-07-06 Today's Date: 08/31/2015   History of Present Illness  Pt is a 82 yr old male presenting from [facility] with decreased level of consciousness, congested cough, and AMS. Admitted for atrial fibrillation with left BBB and hypokalemia. PMH significant for HTN, dementia, seizure disorder, schizophrenia, and h/o TIA.    Clinical Impression  Prior to admission, pt was living at [facility] group home, where per CM information, he "walked pretty good". Home set up and PLOF difficult to ascertain d/t pt's confusion and slurred speech, but he reports living in a 1-level home with ramped entry and ambulating household distances using RW or by holding onto furniture. Currently, pt is requiring mod-max A x2 for bed mobility, min-mod A x2 for sit <> stand transfers with RW, and unable to clear feet to take any steps. Pt endorses 2 falls over the past 6 months and would benefit from skilled PT to decrease fall risk by addressing noted impairments and functional limitations. Recommend pt discharge to SNF when medically appropriate.     Follow Up Recommendations SNF    Equipment Recommendations  Rolling walker with 5" wheels    Recommendations for Other Services       Precautions / Restrictions Precautions Precautions: Fall Restrictions Weight Bearing Restrictions: No      Mobility  Bed Mobility Overal bed mobility: Needs Assistance;+2 for physical assistance Bed Mobility: Supine to Sit;Sit to Supine  Supine to sit: Max assist;+2 for physical assistance;HOB elevated Sit to supine: Mod assist;+2 for physical assistance;HOB elevated   General bed mobility comments: Pt unable to respond to max cueing for supine > sit, thus requiring max A x2 to achieve sitting EOB. Pt does participate in sit > supine transfers, swinging his LEs onto the bed. Vc's for technique.   Transfers Overall transfer  level: Needs assistance Equipment used: Rolling walker (2 wheeled) Transfers: Sit to/from Stand Sit to Stand: Min assist;Mod assist;+2 physical assistance    General transfer comment: Pt completed 3 sit <> stand transfers at EOB, requiring decreasing assist levels (mod to min x2) with increased repetition. Verbal/tactile cues for hand/foot placement and sequencing. Pt stands in flexed posturing, which he does not correct with vc's.   Ambulation/Gait  General Gait Details: Attempted to march in place at EOB with RW and min A x2, but pt is not able to actively perform adequate hip/knee flexion to clear floor.  Stairs    Wheelchair Mobility    Modified Rankin (Stroke Patients Only)       Balance Overall balance assessment: Needs assistance Sitting-balance support: Bilateral upper extremity supported;Feet supported Sitting balance-Leahy Scale: Poor Sitting balance - Comments: Pt able to tolerate sitting EOB with bilat UEs and LEs supported without physical assist for ~30 seconds, though mostly requiring CGA to maintain balance sitting EOB. Postural control: Posterior lean Standing balance support: Bilateral upper extremity supported Standing balance-Leahy Scale: Poor Standing balance comment: Requires vc's-min A to correct       Pertinent Vitals/Pain Pain Assessment: Faces Faces Pain Scale: Hurts little more Pain Location: L knee Pain Intervention(s): Limited activity within patient's tolerance;Monitored during session;Repositioned  HR and O2 monitored throughout session and maintained WNL.    Home Living Family/patient expects to be discharged to:: Group home  Additional Comments: 1 level home with ramped entrance (uncertain of accuracy; pt's speech is difficult to understand and he displays intermittent bouts of confusion)    Prior Function Level of Independence: Independent  with assistive device(s)  Comments: According to pt, he ambulates holding onto furniture at baseline.  At another point during eval, he endorsed using RW at baseline. According to CM, an employee at the group home reports "he walkspretty good". Again, uncertain of accuracy.     Hand Dominance        Extremity/Trunk Assessment   Upper Extremity Assessment: Overall WFL for tasks assessed   Lower Extremity Assessment: RLE deficits/detail;LLE deficits/detail RLE Deficits / Details: Hip flexion 4/5, knee flexion/extension 5/5 LLE Deficits / Details: Hip flexion, knee flexion/extension all 4/5     Communication   Communication:  (Intermittent bouts of clear speech amidst bouts of slurred speech)  Cognition Arousal/Alertness: Lethargic;Awake/alert (In bed, pt only opened his eyes with cueing. When sitting EO) Behavior [facility] assessed/performed Overall Cognitive Status: No family/caregiver present to determine baseline cognitive functioning Pt is oriented to self, incorrectly labels date as "March 2018"    General Comments Nursing cleared pt for participation in physical therapy.  Pt asleep upon room entry but easily aroused and agreeable to PT session.  Unstageable pressure injury L posterior heel, small abrasion just superior to L fibular head.  Pt with persistent cough throughout eval, intensified with sitting and standing.         Assessment/Plan    PT Assessment Patient needs continued PT services  PT Diagnosis Difficulty walking;Generalized weakness;Acute pain   PT Problem List Decreased strength;Decreased balance;Decreased mobility;Decreased knowledge of use of DME  PT Treatment Interventions DME instruction;Gait training;Functional mobility training;Therapeutic activities;Therapeutic exercise;Balance training;Patient/family education   PT Goals (Current goals can be found in the Care Plan section) Acute Rehab PT Goals Patient Stated Goal: To return to baseline functioning PT Goal Formulation: With patient Time For Goal Achievement: 09/14/15 Potential  to Achieve Goals: Good    Frequency Min 2X/week   Barriers to discharge  (Not at baseline mobility level)         End of Session Equipment Utilized During Treatment: Gait belt Activity Tolerance: Patient tolerated treatment well Patient left: in bed;with call bell/phone within reach;with bed alarm set (space boots in place bilat)           Time: [id number] PT Time Calculation (min) (ACUTE ONLY): 28 min   Charges:         PT G Codes:        Timothy [name], SPT 08/31/2015, 3:49 PM

## 2015-08-31 NOTE — Evaluation (Signed)
Clinical/Bedside Swallow Evaluation Patient Details  Name: Timothy [name] Teall MRN: [id number] Date of Birth: 16-Sep-1932  Today's Date: 08/31/2015 Time: SLP Start Time (ACUTE ONLY): 1300 SLP Stop Time (ACUTE ONLY): 1400 SLP Time Calculation (min) (ACUTE ONLY): 60 min  Past Medical History:  Past Medical History  Diagnosis Date  . Seizures (HCC)   . TIA (transient ischemic attack)   . Schizophrenia (HCC)   . Hypertension    Past Surgical History: History reviewed. No pertinent past surgical history.    HPI: Patient with advanced Dementia, schizophrenia and seizures, HTN admitted with altered mental status from group home. -Patient had ran out of his seizure medications prior to admission. Restarted meds here in the hospital. Mental status is back to baseline. -Having cough with productive sputum. Report from the Group Home pt does exhibit s/s of Dysphagia during meals.      Assessment / Plan / Recommendation Clinical Impression  Pt appeared to tolerate trials of Nectar consistency liquids (via pinched straw) and small trials of puree w/ strict aspiration precautions; no decline in status noted during/post trials but noted increased phlegm intermittently post swallowing w/ trials. Oral phase appeared grossly wfl w/ trial consistencies given. Recommend continued dyspahgia diet d/t increased risk for aspiration - per report at Group Home, pt was exhibiting s/s of aspiration w/ po intake("coughing with meals").     Aspiration Risk  Moderate aspiration risk    Diet Recommendation  Dysphagia 1(puree), Nectar consistency liquids; strict aspiration precautions; Reflux precautions; feeding assistance at meals.   Medication Administration: Crushed with puree    Other  Recommendations Recommended Consults:  (dietician) Oral Care Recommendations: Oral care BID;Staff/trained caregiver to provide oral care Other Recommendations: Order thickener from pharmacy;Prohibited food (jello, ice cream, thin  soups);Remove water pitcher;Have oral suction available   Follow up Recommendations  Skilled Nursing facility (TBD)    Frequency and Duration min 3x week  2 weeks       Prognosis Prognosis for Safe Diet Advancement: Fair Barriers to Reach Goals: Cognitive deficits;Severity of deficits      Swallow Study   General Date of Onset: 08/29/15 Type of Study: Bedside Swallow Evaluation Previous Swallow Assessment: none indicated Diet Prior to this Study: Thin liquids (soft foods per report) Temperature Spikes Noted: No (wbc not elevated) Respiratory Status: Room air History of Recent Intubation: No Behavior/Cognition: Alert;Cooperative;Pleasant mood;Confused;Distractible;Requires cueing Oral Cavity Assessment: Within Functional Limits Oral Care Completed by SLP: Recent completion by staff Oral Cavity - Dentition: Poor condition;Missing dentition Vision:  (n/a) Self-Feeding Abilities: Total assist Patient Positioning: Upright in bed;Postural control adequate for testing (stiff neck/head and did not look down) Baseline Vocal Quality: Low vocal intensity Volitional Cough: Cognitively unable to elicit Volitional Swallow: Unable to elicit    Oral/Motor/Sensory Function Overall Oral Motor/Sensory Function:  (uanble to fully assess sec. to Cognitive decline)   Ice Chips Ice chips: Not tested   Thin Liquid Thin Liquid: Impaired Presentation: Spoon;Straw (pinched straw at times; 6 trials) Oral Phase Impairments:  (none) Oral Phase Functional Implications:  (none) Pharyngeal  Phase Impairments: Suspected delayed Swallow;Multiple swallows;Cough - Delayed (audible swallows)    Nectar Thick Nectar Thick Liquid: Impaired Presentation: Straw;Spoon (fed; pinched straw at times; 10 trials) Oral Phase Impairments:  (none) Oral phase functional implications:  (none) Pharyngeal Phase Impairments: Multiple swallows;Suspected delayed Swallow;Cough - Delayed (post belching x1)   Honey Thick Honey  Thick Liquid: Impaired Presentation: Spoon;Straw (fed; 3 trials) Oral Phase Impairments:  (none) Oral Phase Functional Implications:  (none) Pharyngeal Phase Impairments:  Suspected delayed Swallow;Multiple swallows   Puree Puree: Impaired Presentation: Spoon (fed; 10 trials) Oral Phase Impairments:  (none ) Oral Phase Functional Implications: Prolonged oral transit (min.) Pharyngeal Phase Impairments: Suspected delayed Swallow;Multiple swallows   Solid   GO     Solid: Not tested       Jerilynn Som, MS, CCC-SLP  Zaidan Keeble 08/31/2015,3:29 PM

## 2015-08-31 NOTE — Care Management Important Message (Signed)
Important Message  Patient Details  Name: Timothy [name] MRN: [id number] Date of Birth: 08/05/1932   Medicare Important Message Given:  Yes    [name], Kasi Lasky R, RN 08/31/2015, 8:33 AM

## 2015-08-31 NOTE — Clinical Documentation Improvement (Signed)
Hospitalist  Can the diagnosis of pneumonia be further specified?   Type of Pneumonia - CAP, HCAP, Bacterial Pneumonia, Aspiration Pneumonia, Gram Negative Pneumonia, Other Condition, Unable to Clinically Determine  Document causative organism if known  Document any associated diagnoses/conditions such as Respiratory Failure, Sepsis, Underlying Lung Disease (Specify), Other (Specify)  Other condition  Clinically Undetermined   Supporting Information: :  Dysphagia-coughing with eating and has thick productive sputum. Sputum culture sent for. -Started on Zosyn for possible aspiration. Follow-up chest x-ray tomorrow. Speech therapy consult pending today per 6/19 progress notes.  Please exercise your independent, professional judgment when responding. A specific answer is not anticipated or expected.   Thank Sabino DonovanYou, Khristian Phillippi [facility]ws[facility] Information Management [place] [phone]

## 2015-08-31 NOTE — Clinical Documentation Improvement (Signed)
Hospitalist  Can the diagnosis of altered mental status be further specified?   Confusion/delirium (including drug induced)  Transient alteration of awareness  Encephalopathy - Alcoholic, Anoxic/Hypoxia, Drug Induced/Toxic (specify drug), Hepatic, Hypertensive, Hypoglycemic, Metabolic/Septic, Traumatic/post concussive, Wernicke, Other  Other  Clinically Undetermined  Document any associated diagnoses/conditions.   Supporting Information: Presents with AMS per 6/19 progress notes.  Please exercise your independent, professional judgment when responding. A specific answer is not anticipated or expected.   Thank Timothy DonovanYou,  Timothy [name] Health Information Management [place] [phone]

## 2015-08-31 NOTE — [place] FL2 (Deleted)
[place] MEDICAID FL2 LEVEL OF CARE SCREENING TOOL     IDENTIFICATION  Patient Name: Timothy [name] Birthdate: 1932/08/08 Sex: male Admission Date (Current Location): 08/29/2015  [place]   Facility and Address:  [facility], [address], [place], [place] [address]      Provider Number: [id number]  Attending Physician Name and Address:  Enid Baasadhika Kalisetti, MD  Relative Name and Phone Number:       Current Level of Care: Hospital Recommended Level of Care: Skilled Nursing Facility Prior Approval Number:    Date Approved/Denied:   PASRR Number:  ( [id number] )  Discharge Plan: SNF    Current Diagnoses: Patient Active Problem List   Diagnosis Date Noted  . Pressure ulcer 08/30/2015  . Atrial fibrillation and flutter (HCC) 08/29/2015  . Cerebral vascular disease 06/09/2015  . B12 deficiency 06/09/2015  . Hypokalemia 06/09/2015  . 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 Seizures (HCC) 11/28/2014  . TIA (transient ischemic attack) 11/28/2014  . Schizophrenia (HCC) 11/28/2014  . Hypertension 11/28/2014  . Dyslipidemia 11/28/2014  . Dementia 11/28/2014    Orientation RESPIRATION BLADDER Height & Weight     Self, Place  Normal Continent Weight: 145 lb 1.6 oz (65.817 kg) Height:  5\' 11"  (180.3 cm)  BEHAVIORAL SYMPTOMS/MOOD NEUROLOGICAL BOWEL NUTRITION STATUS   (none )  (none ) Continent Diet (Diet: DYS 1 )  AMBULATORY STATUS COMMUNICATION OF NEEDS Skin   Extensive Assist Verbally PU Stage and Appropriate Care (Pressure Ulcer Stage 2: Coccyx. Pressure Ulcer: Left Heel )                       Personal Care Assistance Level of Assistance  Bathing, Feeding, Dressing Bathing Assistance: Limited assistance Feeding assistance: Independent Dressing Assistance: Limited assistance     Functional Limitations Info  Sight, Hearing, Speech Sight Info: Adequate Hearing Info: Adequate Speech Info: Adequate     SPECIAL CARE FACTORS FREQUENCY  PT (By licensed PT), OT (By licensed OT)     PT Frequency:  (5) OT Frequency:  (5)            Contractures      Additional Factors Info  Code Status, Allergies Code Status Info:  (Full Code. ) Allergies Info:  (No Known Allergies. )           Current Medications (08/31/2015):  This is the current hospital active medication list Current Facility-Administered Medications  Medication Dose Route Frequency Provider Last Rate Last Dose  . acetaminophen (TYLENOL) tablet 650 mg  650 mg Oral Q6H PRN Houston SirenVivek J Sainani, MD       Or  . acetaminophen (TYLENOL) suppository 650 mg  650 mg Rectal Q6H PRN Houston SirenVivek J Sainani, MD      . amiodarone (PACERONE) tablet 400 mg  400 mg Oral Daily Enid Baasadhika Kalisetti, MD   400 mg at 08/31/15 1250  . amLODipine (NORVASC) tablet 10 mg  10 mg Oral Daily Houston SirenVivek J Sainani, MD   10 mg at 08/31/15 0946  . atorvastatin (LIPITOR) tablet 10 mg  10 mg Oral QHS Houston SirenVivek J Sainani, MD   10 mg at 08/30/15 2234  . cholecalciferol (VITAMIN D) tablet 1,000 Units  1,000 Units Oral Daily Houston SirenVivek J Sainani, MD   1,000 Units at 08/31/15 0946  . dextrose 5 % with KCl 20 mEq / L  infusion  20 mEq Intravenous Continuous Houston SirenVivek J Sainani, MD 50 mL/hr at 08/30/15 1905 20 mEq  at 08/30/15 1905  . enoxaparin (LOVENOX) injection 40 mg  40 mg Subcutaneous Q24H Houston Siren, MD   40 mg at 08/30/15 2234  . haloperidol lactate (HALDOL) injection 2.5 mg  2.5 mg Intravenous Q4H PRN Houston Siren, MD      . lamoTRIgine (LAMICTAL) tablet 150 mg  150 mg Oral BID Houston Siren, MD   150 mg at 08/31/15 0946  . levETIRAcetam (KEPPRA) 100 MG/ML solution 1,500 mg  1,500 mg Oral BID Houston Siren, MD   1,500 mg at 08/31/15 0946  . memantine (NAMENDA) tablet 5 mg  5 mg Oral Daily Houston Siren, MD   5 mg at 08/31/15 0946  . ondansetron (ZOFRAN) tablet 4 mg  4 mg Oral Q6H PRN Houston Siren, MD       Or  . ondansetron (ZOFRAN) injection 4 mg  4 mg Intravenous Q6H  PRN Houston Siren, MD      . piperacillin-tazobactam (ZOSYN) IVPB 3.375 g  3.375 g Intravenous Q8H Enid Baas, MD   3.375 g at 08/31/15 0952  . sodium chloride flush (NS) 0.9 % injection 3 mL  3 mL Intravenous Q12H Houston Siren, MD   3 mL at 08/31/15 1000  . vitamin B-12 (CYANOCOBALAMIN) tablet 1,000 mcg  1,000 mcg Oral Daily Houston Siren, MD   1,000 mcg at 08/31/15 1478     Discharge Medications: Please see discharge summary for a list of discharge medications.  Relevant Imaging Results:  Relevant Lab Results:   Additional Information  (SSN: [id number])  Haig Prophet, LCSW

## 2015-09-01 ENCOUNTER — Inpatient Hospital Stay: Payer: Medicare Other

## 2015-09-01 LAB — BASIC METABOLIC PANEL
ANION GAP: 6 (ref 5–15)
BUN: 15 mg/dL (ref 6–20)
CALCIUM: 8.8 mg/dL — AB (ref 8.9–10.3)
CHLORIDE: 107 mmol/L (ref 101–111)
CO2: 28 mmol/L (ref 22–32)
Creatinine, Ser: 1.02 mg/dL (ref 0.61–1.24)
GFR calc non Af Amer: >60 mL/min (ref >60)
GLUCOSE: 92 mg/dL (ref 65–99)
POTASSIUM: 4.2 mmol/L (ref 3.5–5.1)
Sodium: 141 mmol/L (ref 135–145)

## 2015-09-01 NOTE — Evaluation (Signed)
Objective Swallowing Evaluation: Type of Study: MBS-Modified Barium Swallow Study  Patient Details  Name: Timothy [name] MRN: [id number] Date of Birth: [birth year]  Today's Date: 09/01/2015 Time: SLP Start Time (ACUTE ONLY): 1100-SLP Stop Time (ACUTE ONLY): 1200 SLP Time Calculation (min) (ACUTE ONLY): 60 min  Past Medical History:  Past Medical History  Diagnosis Date  . Seizures (HCC)   . TIA (transient ischemic attack)   . Schizophrenia (HCC)   . Hypertension    Past Surgical History: History reviewed. No pertinent past surgical history. HPI: Pt is a 82 y.o. male with a known history of Dementia, history of seizures, schizophrenia, hypertension who presented to the hospital from a group home secondary to altered mental status and also noted to be tachycardic. Patient himself has advanced dementia and therefore history is unobtainable and mostly obtained from the chart and also from the ER physician. As per the ER physician patient reports to the hospital due to change in mental status. Pt has on a dysphagia diet since BSE performed but continues to present w/ a congested cough intermittently during meals. recent CXR revealed slightly increased patchy right lower lobe consolidation. No increase in temps or WBC at this time. Pt requires monitoring at meals d/t impulsive eating behavior suspect d/t Dementia.   Subjective: pt awake, confused. Mumbled speech.   Subjective: Patient behavior: (alertness, ability to follow instructions, etc.): Chief complaint   Objective:  Radiological Procedure: A videoflouroscopic evaluation of oral-preparatory, reflex initiation, and pharyngeal phases of the swallow was performed; as well as a screening of the upper esophageal phase.  I. POSTURE: upright II. VIEW: lateral III. COMPENSATORY STRATEGIES: pt unable to use a chin tuck or head turn d/t restricted head/neck movements; Cognitive decline IV. BOLUSES ADMINISTERED:  Thin Liquid: n/a  Nectar-thick  Liquid: 3 tsps  Honey-thick Liquid: 3 tsps  Puree: 3 tsps  Mechanical Soft: n/a V. RESULTS OF EVALUATION: A. LARYNGEAL PENETRATION: (Material entering into the laryngeal inlet/vestibule but not aspirated): intermittently post swallow from the pharyngeal residue  B. ASPIRATION: slight amount of penetrated material noted toward end of study on/just below the vocal cords C. ESOPHAGEAL PHASE: (Screening of the upper esophagus): no apparent, gross deficits  PLAN/RECOMMENDATIONS:  A. Diet: Dysphagia 1; honey consistency liquids by tsp or small amount by cup sip(monitored carefully by staff)  B. Swallowing Precautions: aspiration precautions; strategies to include multiple swallows, alternate food/liquid, cough/throat clearing intermittently during and at end of meals.   C. Recommended consultation to: Palliative Care for discussion of above and potential pulmonary compromise, alternative means of feeding(PEG) option  D. Therapy recommendations: ST f/u for education w/ staff on precautions, strategies  E. Results and recommendations were discussed w/ MD, NSG, Dietician  Assessment / Plan / Recommendation  CHL IP CLINICAL IMPRESSIONS 09/01/2015  Therapy Diagnosis Moderate pharyngeal phase dysphagia;Severe pharyngeal phase dysphagia;Mild oral phase dysphagia;Moderate oral phase dysphagia  Clinical Impression Pt presents w/ moderate-severe pharyngeal phase dysphagia; mild-moderate oral phase dysphagia during this evaluation today. During the pharyngeal phase, a mildly delayed pharyngeal swallow was initiation noted intermittently; moderate-severe pharyngeal residue remained post swallow in both the valleculae and pyriform sinuses secondary to the reduced hyolaryngeal excursion w/ anterior movement w/ the decreased pharyngeal pressure during the swallow. Pt exhibited reduced pharyngeal sensation to the moderate+ degree of pharyngeal residue remaining post swallow. Between 2-3 trials, laryngeal penetration  was noted to which pt was insensate and did not exhibit a cough or throat clearing in attempt to clear the penetrated material in the laryngeal vestibule -  SLP instructed pt on using a strong cough for airway protection. During the oral phase, pt exhibited lingual pumping and slower A-P transfer w/ min+ oral residue remaining post swallow. Pt followed w/ a second swallow which was fairly effective in reducing/clearing the oral residue b/t trials. Unsure if any restriction in the UES d/t the small amounts of bolus material that cleared w/ each swallow attempt but suspect this was d/t the decreased pharyngeal pressure during the swallow. Due to pt's presentation of moderate-severe pharynngeal phase dysphagia w/ the reduced laryngeal sensation to laryngeal penetration of bolus material of each consistency assessed, pt is at increased risk for aspiration and at risk for pulmonary compromise. Suspect pt's oral phase deficits could be related to his Cognitive decline(Dementia). Results of this exam were discussed w/ MD. MD agreed w/ continuing w/ a strict Dysphagia diet w/ strict aspiration precautions at this time; 100% supervision at all meals for following aspriation precautions and swallowing strategies. ST is recommended for f/u w/ pt's status and education w/ staff on precautions and strategies. NSG updated.   Impact on safety and function Moderate aspiration risk;Severe aspiration risk      CHL IP TREATMENT RECOMMENDATION 09/01/2015  Treatment Recommendations Therapy as outlined in treatment plan below     Prognosis 09/01/2015  Prognosis for Safe Diet Advancement Guarded  Barriers to Reach Goals Cognitive deficits;Severity of deficits  Barriers/Prognosis Comment --    CHL IP DIET RECOMMENDATION 09/01/2015  SLP Diet Recommendations Dysphagia 1 (Puree) solids;Honey thick liquids  Liquid Administration via Spoon;Cup  Medication Administration Crushed with puree  Compensations Minimize environmental  distractions;Slow rate;Small sips/bites;Lingual sweep for clearance of pocketing;Multiple dry swallows after each bite/sip;Follow solids with liquid;Clear throat intermittently;Hard cough after swallow  Postural Changes Remain semi-upright after after feeds/meals (Comment);Seated upright at 90 degrees      CHL IP OTHER RECOMMENDATIONS 09/01/2015  Recommended Consults Consider GI evaluation  Oral Care Recommendations Oral care BID;Staff/trained caregiver to provide oral care  Other Recommendations Order thickener from pharmacy;Prohibited food (jello, ice cream, thin soups);Remove water pitcher;Have oral suction available      CHL IP FOLLOW UP RECOMMENDATIONS 09/01/2015  Follow up Recommendations Skilled Nursing facility      [facility] D[facility]  Speech Therapy Frequency (ACUTE ONLY) min 2x/week  Treatment Duration 1 week           CHL IP ORAL PHASE 09/01/2015  Oral Phase Impaired  Oral - Pudding Teaspoon --  Oral - Pudding Cup --  Oral - Honey Teaspoon --  Oral - Honey Cup --  Oral - Nectar Teaspoon --  Oral - Nectar Cup --  Oral - Nectar Straw --  Oral - Thin Teaspoon --  Oral - Thin Cup --  Oral - Thin Straw --  Oral - Puree --  Oral - Mech Soft --  Oral - Regular --  Oral - Multi-Consistency --  Oral - Pill --  Oral Phase - Comment increased oral phase time for bolus management and transfer A-P(noted min lingual pumping); min+ oral residue post swallow    CHL IP PHARYNGEAL PHASE 09/01/2015  Pharyngeal Phase Impaired  Pharyngeal- Pudding Teaspoon --  Pharyngeal --  Pharyngeal- Pudding Cup --  Pharyngeal --  Pharyngeal- Honey Teaspoon --  Pharyngeal --  Pharyngeal- Honey Cup --  Pharyngeal --  Pharyngeal- Nectar Teaspoon --  Pharyngeal --  Pharyngeal- Nectar Cup --  Pharyngeal --  Pharyngeal- Nectar Straw --  Pharyngeal --  Pharyngeal- Thin Teaspoon --  Pharyngeal --  Pharyngeal- Thin Cup --  Pharyngeal --  Pharyngeal- Thin Straw --   Pharyngeal --  Pharyngeal- Puree --  Pharyngeal --  Pharyngeal- Mechanical Soft --  Pharyngeal --  Pharyngeal- Regular --  Pharyngeal --  Pharyngeal- Multi-consistency --  Pharyngeal --  Pharyngeal- Pill --  Pharyngeal --  Pharyngeal Comment delayed pharyngeal swallow initiation noted; moderate-severe pharyngeal residue remained post swallow; reduced hyolaryngeal excursion and decreased pharyngeal pressure during the swallow; reduced pharyngeal sensation to the degree of pharyngeal residue     CHL IP CERVICAL ESOPHAGEAL PHASE 09/01/2015  Cervical Esophageal Phase (No Data)  Pudding Teaspoon --  Pudding Cup --  Honey Teaspoon --  Honey Cup --  Nectar Teaspoon --  Nectar Cup --  Nectar Straw --  Thin Teaspoon --  Thin Cup --  Thin Straw --  Puree --  Mechanical Soft --  Regular --  Multi-consistency --  Pill --  Cervical Esophageal Comment --    No flowsheet data found.    Jerilynn Som, MS, CCC-SLP  Watson,Katherine 09/01/2015, 1:37 PM

## 2015-09-01 NOTE — Progress Notes (Signed)
Physical Therapy Treatment Patient Details Name: Timothy [name] MRN: [id number] DOB: [birth year] Today's Date: 09/01/2015    History of Present Illness Pt is a 82 yr old male presenting from [facility] with decreased level of consciousness, congested cough, and AMS. Admitted for atrial fibrillation with left BBB and hypokalemia. PMH significant for HTN, dementia, seizure disorder, schizophrenia, and h/o TIA.    PT Comments    Pt in bed ready for session.  Participated in exercises as described below.  Pt transitioned to edge of bed with min a x 2.  Good effort by pt.  Sitting edge of bed with supervision.  Pt was able to stand x 3 at edge of bed.  He was able to step in place.  During standing he was incontinent of moderate BM.  He was able to stand for cleaning with nursing staff.  He was then able to transfer to chair at bedside with min a x 2.  Pt with stooped gait requiring verbal cues to correct.  Pt should be +2 assist for safety.  Discussed with CNA.       Follow Up Recommendations  SNF     Equipment Recommendations  Rolling walker with 5" wheels    Recommendations for Other Services       Precautions / Restrictions Precautions Precautions: Fall Restrictions Weight Bearing Restrictions: No    Mobility  Bed Mobility Overal bed mobility: Needs Assistance;+2 for physical assistance Bed Mobility: Supine to Sit     Supine to sit: Min assist;+2 for physical assistance     General bed mobility comments: pt with good effort with bed mobility today  Transfers Overall transfer level: Needs assistance Equipment used: Rolling walker (2 wheeled) Transfers: Sit to/from Stand Sit to Stand: Min assist;Mod assist;+2 physical assistance            Ambulation/Gait                 Stairs            Wheelchair Mobility    Modified Rankin (Stroke Patients Only)       Balance Overall balance assessment: Needs assistance Sitting-balance support:  Feet supported Sitting balance-Leahy Scale: Poor     Standing balance support: Bilateral upper extremity supported Standing balance-Leahy Scale: Poor                      Cognition Arousal/Alertness: Awake/alert Behavior During Therapy: WFL for tasks assessed/performed Overall Cognitive Status: No family/caregiver present to determine baseline cognitive functioning                      Exercises General Exercises - Lower Extremity Ankle Circles/Pumps: AROM;Both;10 reps;Supine Heel Slides: AAROM;Both;10 reps;Supine Hip ABduction/ADduction: AAROM;Both;10 reps;Supine Straight Leg Raises: AAROM;Both;10 reps;Supine Hip Flexion/Marching: AROM;Both;20 reps;Standing    General Comments        Pertinent Vitals/Pain Pain Assessment: No/denies pain    Home Living                      Prior Function            PT Goals (current goals can now be found in the care plan section) Progress towards PT goals: Progressing toward goals    Frequency  Min 2X/week    PT Plan Current plan remains appropriate    Co-evaluation             End of Session Equipment Utilized During Treatment: Gait belt  Activity Tolerance: Patient tolerated treatment well Patient left: in chair;with call bell/phone within reach;with chair alarm set     Time: [id number] PT Time Calculation (min) (ACUTE ONLY): 32 min  Charges:  $Therapeutic Exercise: 8-22 mins $Therapeutic Activity: 8-22 mins                    G Codes:      Danielle DessSarah Bellatrix Devonshire, PTA 09/01/2015, 3:43 PM

## 2015-09-01 NOTE — Progress Notes (Signed)
[facility] [place] [facility]   PATIENT NAME: Timothy [name]    MR#:  [id number]  DATE OF BIRTH:  [birth year]  SUBJECTIVE:  CHIEF COMPLAINT:   Chief Complaint  Patient presents with  . Altered Mental Status   - Patient with advanced dementia, schizophrenia and seizures admitted with altered mental status from group home. -trouble following commands at times, was ambulating prior to a week, now very weak, MBSS today- high aspiration risk - needs SNF  REVIEW OF SYSTEMS:  Review of Systems  Unable to perform ROS: dementia    DRUG ALLERGIES:  No Known Allergies  VITALS:  Blood pressure 103/48, pulse 77, temperature 98.5 F (36.9 C), temperature source Oral, resp. rate 24, height  (1.803 m), weight 65.817 kg (145 lb 1.6 oz), SpO2 97 %.  PHYSICAL EXAMINATION:  Physical Exam  GENERAL:  82 y.o.-year-old patient lying in the bed with no acute distress. Disheveled appearing EYES: Pupils equal, round, reactive to light and accommodation. No scleral icterus. Extraocular muscles intact.  HEENT: Head atraumatic, normocephalic. Oropharynx and nasopharynx clear.  NECK:  Supple, no jugular venous distention. No thyroid enlargement, no tenderness.  LUNGS: Normal breath sounds bilaterally, no wheezing, rales,rhonchi or crepitation. No use of accessory muscles of respiration. Decreased bibasilar breath sounds CARDIOVASCULAR: S1, S2 normal. No murmurs, rubs, or gallops.  ABDOMEN: Soft, nontender, nondistended. Bowel sounds present. No organomegaly or mass.  EXTREMITIES: No pedal edema, cyanosis, or clubbing. Right hand in mitten NEUROLOGIC:  Awake and alert, following simple commands, moving both arms easily, weakness in lower extremities noted but still 3/5 strength PSYCHIATRIC: The patient is alert and oriented to self.  SKIN: No obvious rash, lesion, or ulcer.    LABORATORY PANEL:   CBC  Recent Labs Lab 08/31/15 0453  WBC 7.6  HGB 11.2*  HCT  33.7*  PLT 242   ------------------------------------------------------------------------------------------------------------------  Chemistries   Recent Labs Lab 08/29/15 1535 08/30/15 0232  09/01/15 0820  NA 147* 148*  < > 141  K 3.3* 2.8*  < > 4.2  CL 108 111  < > 107  CO2 30 28  < > 28  GLUCOSE 115* 136*  < > 92  BUN 39* 29*  < > 15  CREATININE 1.28* 1.14  < > 1.02  CALCIUM 9.4 8.8*  < > 8.8*  MG 2.6* 2.3  --   --   AST 290*  --   --   --   ALT 136*  --   --   --   ALKPHOS 71  --   --   --   BILITOT 1.2  --   --   --   < > = values in this interval not displayed. ------------------------------------------------------------------------------------------------------------------  Cardiac Enzymes  Recent Labs Lab 08/30/15 0624  TROPONINI 0.03   ------------------------------------------------------------------------------------------------------------------  RADIOLOGY:  Dg Chest 2 View  08/31/2015  CLINICAL DATA:  Aspiration. EXAM: CHEST  2 VIEW COMPARISON:  08/29/2015 chest radiograph. FINDINGS: Stable cardiomediastinal silhouette with mild cardiomegaly. No pneumothorax. No pleural effusion. Patchy right lower lobe consolidation appears slightly increased. No pulmonary edema. IMPRESSION: Slightly increased patchy right lower lobe consolidation, consistent with the provided history of aspiration. Electronically Signed   By: Delbert Phenix M.D.   On: 08/31/2015 10:31    EKG:   Orders placed or performed during the hospital encounter of 08/29/15  . EKG 12-Lead  . EKG 12-Lead    ASSESSMENT AND PLAN:   82 year old male with history of dementia,  seizures, schizophrenia and hypertension presented from group home for change in mental status and also atrial fibrillation  #1 atrial fibrillation-new-onset. on amiodarone drip. Converted to normal sinus rhythm. - on oral amiodarone -Cardiology consult appreciated. Echocardiogram with EF of 40%. -Correct electrolytes -TSH  within normal limits. Slightly elevated free T4. Patient not on any thyroid supplements. Monitor and recheck in a few months.  #2 Aspiration Pneumonia- MBSS with high risk of aspiration, thickened liquids, aspiration precautions Likely will need PEG at some point if continues to get recurrent pneumonias  on Zosyn. Change to oral ABX at discharge  #3 seizure disorder-continue Keppra and Lamictal.  #4 hypertension-continue Norvasc  #5 advanced dementia-pleasantly confused, Currently oriented to self. Continue Namenda. Following commands. History of behavioral disturbance.  -Physical therapy consulted. Anticipate discharge tomorrow  To SNF    All the records are reviewed and case discussed with Care Management/Social Workerr. Management plans discussed with the patient, family and they are in agreement.  CODE STATUS: Full Code  TOTAL TIME TAKING CARE OF THIS PATIENT: 33 minutes.   POSSIBLE D/C IN 1 day, DEPENDING ON CLINICAL CONDITION.   Timothy [name] M.D on 09/01/2015 at 3:39 PM  Between 7am to 6pm - Pager - [phone]  After 6pm go to [email or web address] - password [email or web address] [facility]  [facility]  Office  [phone]  CC: Primary care physician; No PCP Per Patient

## 2015-09-01 NOTE — NC FL2 (Signed)
[place] MEDICAID FL2 LEVEL OF CARE SCREENING TOOL     IDENTIFICATION  Patient Name: Timothy [name] Birthdate: 11-18-1932 Sex: male Admission Date (Current Location): 08/29/2015  [place] and [place] Number:  Chiropodist and Address:  [facility], [address], [place], [place] [address]      Provider Number: [id number]  Attending Physician Name and Address:  Enid Baas, MD  Relative Name and Phone Number:       Current Level of Care: Hospital Recommended Level of Care: Skilled Nursing Facility Prior Approval Number:    Date Approved/Denied:   PASRR Number:  [id number])  Discharge Plan: SNF    Current Diagnoses: Patient Active Problem List   Diagnosis Date Noted  . Pressure ulcer 08/30/2015  . Atrial fibrillation and flutter (HCC) 08/29/2015  . Cerebral vascular disease 06/09/2015  . B12 deficiency 06/09/2015  . Hypokalemia 06/09/2015  . 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 Seizures (HCC) 11/28/2014  . TIA (transient ischemic attack) 11/28/2014  . Schizophrenia (HCC) 11/28/2014  . Hypertension 11/28/2014  . Dyslipidemia 11/28/2014  . Dementia 11/28/2014    Orientation RESPIRATION BLADDER Height & Weight     Self, Place  Normal Continent Weight: 145 lb 1.6 oz (65.817 kg) Height:   (180.3 cm)  BEHAVIORAL SYMPTOMS/MOOD NEUROLOGICAL BOWEL NUTRITION STATUS   (none )  (none ) Continent Diet (Diet: DYS 1 )  AMBULATORY STATUS COMMUNICATION OF NEEDS Skin   Extensive Assist Verbally PU Stage and Appropriate Care (Pressure Ulcer Stage 2: Coccyx. Pressure Ulcer: Left Heel )                       Personal Care Assistance Level of Assistance  Bathing, Feeding, Dressing Bathing Assistance: Limited assistance Feeding assistance: Independent Dressing Assistance: Limited assistance     Functional Limitations Info  Sight, Hearing, Speech Sight Info: Adequate Hearing Info: Adequate Speech Info: Adequate     SPECIAL CARE FACTORS FREQUENCY  PT (By licensed PT), OT (By licensed OT)     PT Frequency:  (5) OT Frequency:  (5)            Contractures      Additional Factors Info  Code Status, Allergies Code Status Info:  (Full Code. ) Allergies Info:  (No Known Allergies. )           Current Medications (09/01/2015):  This is the current hospital active medication list Current Facility-Administered Medications  Medication Dose Route Frequency Provider Last Rate Last Dose  . acetaminophen (TYLENOL) tablet 650 mg  650 mg Oral Q6H PRN Houston Siren, MD       Or  . acetaminophen (TYLENOL) suppository 650 mg  650 mg Rectal Q6H PRN Houston Siren, MD      . amiodarone (PACERONE) tablet 400 mg  400 mg Oral Daily Enid Baas, MD   400 mg at 09/01/15 1013  . amLODipine (NORVASC) tablet 10 mg  10 mg Oral Daily Houston Siren, MD   10 mg at 09/01/15 1013  . atorvastatin (LIPITOR) tablet 10 mg  10 mg Oral QHS Houston Siren, MD   10 mg at 08/31/15 2136  . cholecalciferol (VITAMIN D) tablet 1,000 Units  1,000 Units Oral Daily Houston Siren, MD   1,000 Units at 09/01/15 1013  . dextrose 5 % with KCl 20 mEq / L  infusion  20 mEq Intravenous Continuous Houston Siren, MD 50 mL/hr at 09/01/15 1013 20 mEq at 09/01/15  1013  . enoxaparin (LOVENOX) injection 40 mg  40 mg Subcutaneous Q24H Houston SirenVivek J Sainani, MD   40 mg at 08/31/15 2136  . haloperidol lactate (HALDOL) injection 2.5 mg  2.5 mg Intravenous Q4H PRN Houston SirenVivek J Sainani, MD   2.5 mg at 09/01/15 0022  . lamoTRIgine (LAMICTAL) tablet 150 mg  150 mg Oral BID Houston SirenVivek J Sainani, MD   150 mg at 09/01/15 1013  . levETIRAcetam (KEPPRA) 100 MG/ML solution 1,500 mg  1,500 mg Oral BID Houston SirenVivek J Sainani, MD   1,500 mg at 09/01/15 1013  . memantine (NAMENDA) tablet 5 mg  5 mg Oral Daily Houston SirenVivek J Sainani, MD   5 mg at 09/01/15 1014  . ondansetron (ZOFRAN) tablet 4 mg  4 mg Oral Q6H PRN Houston SirenVivek J Sainani, MD       Or  . ondansetron (ZOFRAN) injection 4 mg   4 mg Intravenous Q6H PRN Houston SirenVivek J Sainani, MD      . piperacillin-tazobactam (ZOSYN) IVPB 3.375 g  3.375 g Intravenous Q8H Enid Baasadhika Kalisetti, MD   3.375 g at 09/01/15 1023  . sodium chloride flush (NS) 0.9 % injection 3 mL  3 mL Intravenous Q12H Houston SirenVivek J Sainani, MD   3 mL at 08/31/15 2138  . vitamin B-12 (CYANOCOBALAMIN) tablet 1,000 mcg  1,000 mcg Oral Daily Houston SirenVivek J Sainani, MD   1,000 mcg at 09/01/15 1013     Discharge Medications: Please see discharge summary for a list of discharge medications.  Relevant Imaging Results:  Relevant Lab Results:   Additional Information  (SSN: [id number])  Verta Ellenhristina E Chrystie Hagwood, LCSW

## 2015-09-01 NOTE — Progress Notes (Signed)
Patient is very agitatted. He is climbing out of the bed, removing telemetry monitor and iv accessess. Administered iv haldol and applied safety mittens. Patient requested for staff to stay in the room. Will chart at bedside to help patient's anxiety.

## 2015-09-01 NOTE — Clinical Documentation Improvement (Signed)
Hospitalist  Can the diagnosis of pressure ulcer be further specified?   Document if pressure ulcer with stage is Present on Admission   Document Site with laterality - Elbow, Back (upper/lower), Sacral, Hip, Buttock, Ankle, Heel, Head, Other (Specify)  Pressure Ulcer Stage - Stage1, Stage 2, Stage 3, Stage 4, Unstageable, Unspecified, Unable to Clinically Determine  Document any associated diagnoses/conditions such as: with gangrene  Other  Clinically Undetermined    Supporting Information: Pressure ulcer per 6/19 progress notes.   Please exercise your independent, professional judgment when responding. A specific answer is not anticipated or expected.   Thank Sabino DonovanYou,  Durga Saldarriaga [facility] [place] [phone]

## 2015-09-01 NOTE — Progress Notes (Signed)
SUBJECTIVE: patient is less short of breath and feeling much better   Filed Vitals:   08/31/15 1112 08/31/15 1917 08/31/15 1919 09/01/15 0411  BP: 96/60 93/51 105/53 107/59  Pulse: 68 71 68 76  Temp: 98.2 F (36.8 C) 98.2 F (36.8 C)  98.9 F (37.2 C)  TempSrc: Oral Oral  Oral  Resp: 18 18  18   Height:      Weight:      SpO2: 99% 97% 100% 95%    Intake/Output Summary (Last 24 hours) at 09/01/15 0839 Last data filed at 09/01/15 0645  Gross per 24 hour  Intake 1387.5 ml  Output    350 ml  Net 1037.5 ml    LABS: Basic Metabolic Panel:  Recent Labs  16/12/9604/17/17 1535 08/30/15 0232 08/31/15 0453  NA 147* 148* 145  K 3.3* 2.8* 3.9  CL 108 111 113*  CO2 30 28 27   GLUCOSE 115* 136* 98  BUN 39* 29* 23*  CREATININE 1.28* 1.14 1.15  CALCIUM 9.4 8.8* 8.7*  MG 2.6* 2.3  --   PHOS 2.7  --   --    Liver Function Tests:  Recent Labs  08/29/15 1535  AST 290*  ALT 136*  ALKPHOS 71  BILITOT 1.2  PROT 7.6  ALBUMIN 3.2*    Recent Labs  08/29/15 1535  LIPASE 16   CBC:  Recent Labs  08/29/15 1535 08/30/15 0232 08/31/15 0453  WBC 9.4 8.7 7.6  NEUTROABS 7.3*  --   --   HGB 13.9 12.0* 11.2*  HCT 42.4 36.0* 33.7*  MCV 83.6 82.0 82.6  PLT 295 264 242   Cardiac Enzymes:  Recent Labs  08/29/15 2236 08/30/15 0232 08/30/15 0624  TROPONINI <0.03 0.04* 0.03   BNP: Invalid input(s): POCBNP D-Dimer: No results for input(s): DDIMER in the last 72 hours. Hemoglobin A1C: No results for input(s): HGBA1C in the last 72 hours. Fasting Lipid Panel: No results for input(s): CHOL, HDL, LDLCALC, TRIG, CHOLHDL, LDLDIRECT in the last 72 hours. Thyroid Function Tests:  Recent Labs  08/29/15 1535  TSH 2.183   Anemia Panel: No results for input(s): VITAMINB12, FOLATE, FERRITIN, TIBC, IRON, RETICCTPCT in the last 72 hours.   PHYSICAL EXAM General: Well developed, well nourished, in no acute distress HEENT:  Normocephalic and atramatic Neck:  No JVD.  Lungs: Clear  bilaterally to auscultation and percussion. Heart: HRRR . Normal S1 and S2 without gallops or murmurs.  Abdomen: Bowel sounds are positive, abdomen soft and non-tender  Msk:  Back normal, normal gait. Normal strength and tone for age. Extremities: No clubbing, cyanosis or edema.   Neuro: Alert and oriented X 3. Psych:  Good affect, responds appropriately  TELEMETRY: inus rhythm  ASSESSMENT AND PLAN: severe aortic stenosis and compensated CHF. Clinically patient isdoing much better with less short of breath. Cardiac point of view may be discharged.  Active Problems:   Atrial fibrillation and flutter (HCC)   Pressure ulcer    Keydi Giel A, MD, [facility] 09/01/2015 8:39 AM

## 2015-09-01 NOTE — Progress Notes (Signed)
  SUBJECTIVE: The patient is awake, alert, disoriented, but pleasant this morning. He denies any chest pain/tightness or shortness of breath.   Filed Vitals:   08/31/15 1112 08/31/15 1917 08/31/15 1919 09/01/15 0411  BP: 96/60 93/51 105/53 107/59  Pulse: 68 71 68 76  Temp: 98.2 F (36.8 C) 98.2 F (36.8 C)  98.9 F (37.2 C)  TempSrc: Oral Oral  Oral  Resp: 18 18  18   Height:      Weight:      SpO2: 99% 97% 100% 95%    Intake/Output Summary (Last 24 hours) at 09/01/15 0809 Last data filed at 09/01/15 0645  Gross per 24 hour  Intake 1387.5 ml  Output    350 ml  Net 1037.5 ml    LABS: Basic Metabolic Panel:  Recent Labs  16/12/9604/17/17 1535 08/30/15 0232 08/31/15 0453  NA 147* 148* 145  K 3.3* 2.8* 3.9  CL 108 111 113*  CO2 30 28 27   GLUCOSE 115* 136* 98  BUN 39* 29* 23*  CREATININE 1.28* 1.14 1.15  CALCIUM 9.4 8.8* 8.7*  MG 2.6* 2.3  --   PHOS 2.7  --   --    Liver Function Tests:  Recent Labs  08/29/15 1535  AST 290*  ALT 136*  ALKPHOS 71  BILITOT 1.2  PROT 7.6  ALBUMIN 3.2*    Recent Labs  08/29/15 1535  LIPASE 16   CBC:  Recent Labs  08/29/15 1535 08/30/15 0232 08/31/15 0453  WBC 9.4 8.7 7.6  NEUTROABS 7.3*  --   --   HGB 13.9 12.0* 11.2*  HCT 42.4 36.0* 33.7*  MCV 83.6 82.0 82.6  PLT 295 264 242   Cardiac Enzymes:  Recent Labs  08/29/15 2236 08/30/15 0232 08/30/15 0624  TROPONINI <0.03 0.04* 0.03   BNP: Invalid input(s): POCBNP D-Dimer: No results for input(s): DDIMER in the last 72 hours. Hemoglobin A1C: No results for input(s): HGBA1C in the last 72 hours. Fasting Lipid Panel: No results for input(s): CHOL, HDL, LDLCALC, TRIG, CHOLHDL, LDLDIRECT in the last 72 hours. Thyroid Function Tests:  Recent Labs  08/29/15 1535  TSH 2.183   Anemia Panel: No results for input(s): VITAMINB12, FOLATE, FERRITIN, TIBC, IRON, RETICCTPCT in the last 72 hours.   PHYSICAL EXAM General: Well developed, well nourished, in no acute  distress HEENT:  Normocephalic and atramatic Neck:  No JVD.  Lungs: Clear bilaterally to auscultation and percussion. Heart: HRRR . Normal S1 and S2 without gallops or murmurs.  Abdomen: Bowel sounds are positive, abdomen soft and non-tender  Msk:  Back normal, normal gait. Normal strength and tone for age. Extremities: No clubbing, cyanosis or edema.   Neuro: Alert and oriented X 3. Psych:  Good affect, responds appropriately  TELEMETRY: Sinus rhythm with frequent PACs, 71 bpm  ASSESSMENT AND PLAN: Atrial fibrillation with rapid ventricular response rate is now back in sinus rhythm on amiodarone. Advise continuing amiodarone 400 mg daily for 1 week and then reducing to 200 mg daily. Titration of amiodarone will be completed in the office.  Active Problems:   Atrial fibrillation and flutter (HCC)   Pressure ulcer    Berton BonJanine Bertina Guthridge, NP 09/01/2015 8:09 AM

## 2015-09-01 NOTE — Clinical Social Work Note (Signed)
Clinical Social Work Assessment  Patient Details  Name: Timothy Timothy [name] MRN: [id number] Date of Birth: [birth year]  Date of referral:  09/01/15               Reason for consult:  Discharge Planning                Permission sought to share information with:  Timothy [name] Permission granted to share information::  Yes, Verbal Permission Granted  Name::        Agency::     Relationship::   (Timothy Timothy [name])  Contact Information:     Housing/Transportation Living arrangements for the past 2 months:  Group Home Source of Information:  Patient, Timothy [name], Facility Patient Interpreter Needed:  None Criminal Activity/Legal Involvement Pertinent to Current Situation/Hospitalization:  No - Comment as needed Significant Relationships:  Friend Lives with:  Facility Resident Do you feel safe going back to the place where you live?  Yes Need for family participation in patient care:  Yes (Comment) (Timothy Timothy [name])  Care giving concerns:  PT stated that patient would benefit from SNF placement.    Social Worker assessment / plan:  CSW attempted to speak to patient but patient is oriented X1. CSW contacted patient's legal guardian Timothy Timothy [name] located on patient's face sheet. Per Timothy Timothy [name] she is from [employer]. She stated that [place] and now lives in [place]. Patient has [facility] for the past several years. Reported that he was walking until about a little over a week ago.  Reported that she feels that patient would benefit from SNF placement. Granted CSW verbal permission to complete a SNF bed search and change patient's PASARR number. FL2/ PASRR completed, [place] and placed in MDs box. Awaiting bed offers. CSW will continue to follow and assist.   Employment status:  Disabled (Comment on whether or not currently receiving Disability) Insurance information:  Medicare PT  Recommendations:  Skilled Nursing Facility Information / Referral to community resources:  Skilled Nursing Facility  Patient/Family's Response to care:  Timothy Timothy [name] is in agreement for patient to begin SNF bed search and patient to go to STR at discharge.   Patient/Family's Understanding of and Emotional Response to Diagnosis, Current Treatment, and Prognosis:  Timothy Timothy [name] is appreciative of CSW's care and understands why patient will need STR at discharge.   Emotional Assessment Appearance:  Appears stated age Attitude/Demeanor/Rapport:   (None) Affect (typically observed):  Calm, Pleasant Orientation:  Oriented to Self Alcohol / Substance use:  Not Applicable Psych involvement (Current and /or in the community):  No (Comment)  Discharge Needs  Concerns to be addressed:  Discharge Planning Concerns Readmission within the last 30 days:  No Current discharge risk:  Chronically ill Barriers to Discharge:  Continued Medical Work up   WalgreenChristina E Laylamarie Meuser, LCSW 09/01/2015, 11:58 AM

## 2015-09-02 DIAGNOSIS — I10 Essential (primary) hypertension: Secondary | ICD-10-CM | POA: Diagnosis not present

## 2015-09-02 DIAGNOSIS — I4891 Unspecified atrial fibrillation: Secondary | ICD-10-CM | POA: Diagnosis not present

## 2015-09-02 DIAGNOSIS — G40802 Other epilepsy, not intractable, without status epilepticus: Secondary | ICD-10-CM | POA: Diagnosis not present

## 2015-09-02 DIAGNOSIS — M6281 Muscle weakness (generalized): Secondary | ICD-10-CM | POA: Diagnosis not present

## 2015-09-02 DIAGNOSIS — R1312 Dysphagia, oropharyngeal phase: Secondary | ICD-10-CM | POA: Diagnosis not present

## 2015-09-02 DIAGNOSIS — F445 Conversion disorder with seizures or convulsions: Secondary | ICD-10-CM | POA: Diagnosis not present

## 2015-09-02 DIAGNOSIS — E876 Hypokalemia: Secondary | ICD-10-CM | POA: Diagnosis not present

## 2015-09-02 DIAGNOSIS — E784 Other hyperlipidemia: Secondary | ICD-10-CM | POA: Diagnosis not present

## 2015-09-02 DIAGNOSIS — Z7401 Bed confinement status: Secondary | ICD-10-CM | POA: Diagnosis not present

## 2015-09-02 DIAGNOSIS — E785 Hyperlipidemia, unspecified: Secondary | ICD-10-CM | POA: Diagnosis not present

## 2015-09-02 DIAGNOSIS — G40909 Epilepsy, unspecified, not intractable, without status epilepticus: Secondary | ICD-10-CM | POA: Diagnosis not present

## 2015-09-02 DIAGNOSIS — I471 Supraventricular tachycardia: Secondary | ICD-10-CM | POA: Diagnosis not present

## 2015-09-02 DIAGNOSIS — D519 Vitamin B12 deficiency anemia, unspecified: Secondary | ICD-10-CM | POA: Diagnosis not present

## 2015-09-02 DIAGNOSIS — F0281 Dementia in other diseases classified elsewhere with behavioral disturbance: Secondary | ICD-10-CM | POA: Diagnosis not present

## 2015-09-02 DIAGNOSIS — J690 Pneumonitis due to inhalation of food and vomit: Secondary | ICD-10-CM | POA: Diagnosis not present

## 2015-09-02 DIAGNOSIS — F0390 Unspecified dementia without behavioral disturbance: Secondary | ICD-10-CM | POA: Diagnosis not present

## 2015-09-02 DIAGNOSIS — M1990 Unspecified osteoarthritis, unspecified site: Secondary | ICD-10-CM | POA: Diagnosis not present

## 2015-09-02 DIAGNOSIS — R569 Unspecified convulsions: Secondary | ICD-10-CM | POA: Diagnosis not present

## 2015-09-02 DIAGNOSIS — Z5189 Encounter for other specified aftercare: Secondary | ICD-10-CM | POA: Diagnosis not present

## 2015-09-02 DIAGNOSIS — J189 Pneumonia, unspecified organism: Secondary | ICD-10-CM | POA: Diagnosis not present

## 2015-09-02 LAB — CULTURE, RESPIRATORY W GRAM STAIN

## 2015-09-02 LAB — CULTURE, RESPIRATORY

## 2015-09-02 MED ORDER — AMOXICILLIN-POT CLAVULANATE 875-125 MG PO TABS: 1.0000 | ORAL_TABLET | Freq: Two times a day (BID) | ORAL | Status: DC

## 2015-09-02 MED ORDER — LEVETIRACETAM 100 MG/ML PO SOLN: 1500.0000 mg | Freq: Two times a day (BID) | ORAL | Status: DC

## 2015-09-02 MED ORDER — AMIODARONE HCL 200 MG PO TABS: 400.0000 mg | ORAL_TABLET | Freq: Every day | ORAL | Status: DC

## 2015-09-02 NOTE — Progress Notes (Signed)
CSW spoke to patient's Legal Guardian- [name]. Presented bed offers. Accepted bed at [facility]. CSW informed [name]- Admissions Coordinator at [facility] of above. [name] requested CSW fax discharge information to [phone].  Woodroe Modehristina Jadaya Sommerfield, MSW, LCSW-A, LCAS-A Clinical Social Worker [phone]

## 2015-09-02 NOTE — Discharge Instructions (Signed)
Diet recommendations:   Diet: Dysphagia 1; honey consistency liquids by tsp or small amount by cup sip(monitored carefully by staff)   Swallowing Precautions: aspiration precautions; strategies to include multiple swallows, alternate food/liquid, cough/throat clearing intermittently during and at end of meals.   Extra gravy on meats/potaotes.  [employer].

## 2015-09-02 NOTE — Clinical Social Work Placement (Signed)
   CLINICAL SOCIAL WORK PLACEMENT  NOTE  Date:  09/02/2015  Patient Details  Name: Timothy [name] MRN: [id number] Date of Birth: [birth year]  Clinical Social Work is seeking post-discharge placement for this patient at the Skilled  Nursing Facility level of care (*CSW will initial, date and re-position this form in  chart as items are completed):  No   Patient/family provided with [facility] Clinical Social Work Department's list of facilities offering this level of care within the geographic area requested by the patient (or if unable, by the patient's family).  No   Patient/family informed of their freedom to choose among providers that offer the needed level of care, that participate in Medicare, Medicaid or managed care program needed by the patient, have an available bed and are willing to accept the patient.      Patient/family informed of [facility]'s ownership interest in [facility] and [facility], as well as of the fact that they are under no obligation to receive care at these facilities.  PASRR submitted to EDS on 09/02/15     PASRR number received on 09/02/15     Existing PASRR number confirmed on       FL2 transmitted to all facilities in geographic area requested by pt/family on 09/02/15     FL2 transmitted to all facilities within larger geographic area on       Patient informed that his/her managed care company has contracts with or will negotiate with certain facilities, including the following:        Yes   Patient/family informed of bed offers received.  Patient chooses bed at  ([facility])     Physician recommends and patient chooses bed at      Patient to be transferred to  ([facility]) on 09/02/15.  Patient to be transferred to facility by  (EMS)     Patient family notified on 09/02/15 of transfer.  Name of family member notified:   ([name]- Legal Guardian)     PHYSICIAN       Additional Comment:     _______________________________________________ Idamae Lusherhristina E Adonis Ryther, LCSW 09/02/2015, 12:00 PM

## 2015-09-02 NOTE — Progress Notes (Signed)
CSW attempted to present bed offers to patient's Legal Guardian V.Thomas. Sent text message due to her voicemail being full. Awaiting phone call back. CSW will continue to follow and assist.  Woodroe Modehristina Kimmy Totten, MSW, LCSW-A, LCAS-A Clinical Social Worker [phone]

## 2015-09-02 NOTE — Progress Notes (Signed)
  SUBJECTIVE: The patient is feeling well this morning with no shortness of breath or chest discomfort.   Filed Vitals:   09/01/15 0411 09/01/15 1125 09/01/15 2007 09/02/15 0446  BP: 107/59 103/48 119/59 117/60  Pulse: 76 77 75 70  Temp: 98.9 F (37.2 C) 98.5 F (36.9 C) 98.4 F (36.9 C) 97.8 F (36.6 C)  TempSrc: Oral  Oral Oral  Resp: 18 24 18 18   Height:      Weight:      SpO2: 95% 97% 97% 96%    Intake/Output Summary (Last 24 hours) at 09/02/15 0829 Last data filed at 09/02/15 0446  Gross per 24 hour  Intake    580 ml  Output    550 ml  Net     30 ml    LABS: Basic Metabolic Panel:  Recent Labs  74/25/9506/19/17 0453 09/01/15 0820  NA 145 141  K 3.9 4.2  CL 113* 107  CO2 27 28  GLUCOSE 98 92  BUN 23* 15  CREATININE 1.15 1.02  CALCIUM 8.7* 8.8*   Liver Function Tests: No results for input(s): AST, ALT, ALKPHOS, BILITOT, PROT, ALBUMIN in the last 72 hours. No results for input(s): LIPASE, AMYLASE in the last 72 hours. CBC:  Recent Labs  08/31/15 0453  WBC 7.6  HGB 11.2*  HCT 33.7*  MCV 82.6  PLT 242   Cardiac Enzymes: No results for input(s): CKTOTAL, CKMB, CKMBINDEX, TROPONINI in the last 72 hours. BNP: Invalid input(s): POCBNP D-Dimer: No results for input(s): DDIMER in the last 72 hours. Hemoglobin A1C: No results for input(s): HGBA1C in the last 72 hours. Fasting Lipid Panel: No results for input(s): CHOL, HDL, LDLCALC, TRIG, CHOLHDL, LDLDIRECT in the last 72 hours. Thyroid Function Tests: No results for input(s): TSH, T4TOTAL, T3FREE, THYROIDAB in the last 72 hours.  Invalid input(s): FREET3 Anemia Panel: No results for input(s): VITAMINB12, FOLATE, FERRITIN, TIBC, IRON, RETICCTPCT in the last 72 hours.   PHYSICAL EXAM General: Well developed, well nourished, in no acute distress HEENT:  Normocephalic and atramatic Neck:  No JVD.  Lungs: Clear bilaterally to auscultation and percussion. Heart: HRRR . Normal S1 and S2 without gallops or  murmurs.  Abdomen: Bowel sounds are positive, abdomen soft and non-tender  Msk:  Back normal, normal gait. Normal strength and tone for age. Extremities: No clubbing, cyanosis or edema.   Neuro: Alert and oriented X 3. Psych:  Good affect, responds appropriately  TELEMETRY: Sinus rhythm at 78 bpm  ASSESSMENT AND PLAN: Atrial fibrillation with rapid ventricular response converted to and is maintaining sinus rhythm on amiodarone 400 mg by mouth daily. Advised to continue 400 mg of amiodarone for 1 week and then reduce to 200 mg daily as a maintenance dose. Patient is stable from cardiac standpoint and may be discharged when medically stable.  Active Problems:   Atrial fibrillation and flutter (HCC)   Pressure ulcer    Berton BonJanine Mindi Akerson, NP 09/02/2015 8:29 AM

## 2015-09-02 NOTE — Progress Notes (Signed)
Clinical Social Worker was informed that patient will be medically ready to discharge to [facility]. Patient and his Legal Guardian [name] in a agreement with plan. CSW called [name]- Admissions Coordinator at [facility] to confirm that patient's bed is ready. Provided patient's room number 610 and number to call for report [phone] . All discharge information faxed to [facility] via [facility]. Call to patient's Legal Guardian- [name], to inform her patient would discharge to [facility]. RN will call report and patient will discharge to [facility] via EMS.  Timothy [name], MSW, LCSW-A, LCAS-A Clinical Social Worker [phone]

## 2015-09-02 NOTE — Progress Notes (Signed)
Patient d/c'd home. Education provided, no questions at this time. Patient picked up by . Telemetry removed. Trudee KusterBrandi R Mansfield

## 2015-09-02 NOTE — Progress Notes (Addendum)
Speech Language Pathology Treatment: Dysphagia  Patient Details Name: Timothy [name] MRN: [id number] DOB: 1932-08-23 Today's Date: 09/02/2015 Time: 1230-1310 SLP Time Calculation (min) (ACUTE ONLY): 40 min  Assessment / Plan / Recommendation Clinical Impression  Pt seen for toleration of diet today post MBSS yesterday; pt was recommended to be on a Dysphagia 1 w/ Honey consistency liquids per the degree of oropharyngeal phase dysphagia assessed during the MBSS. Pt was sitting EOB for eating his meal; support to sit upright on EOB and tray setup given. Pt appeared to intitally tolerate trials of Honey consistency liquids (via cup) and small bites(min. Monitoring given) of puree w/ aspiration precautions and strategies recommended including f/u, dry swallows post lingual sweeping, and alternating foods/liquids. No decline in respiratory or overall status noted during/post trials - pt continued to feed self w/ intermittent support. However, noted increased phlegmy/congested cough intermittently post swallowing po's. Oral phase appeared grossly wfl w/ trial consistencies given; adequate oral clearing. Suspect pt's cough response could have been related to the pharyngeal residue as noted w/ trials during MBSS yesterday. Recommend continue recommended dyspahgia diet w/ strict aspiration precautions and swallow strategies; monitor for any decline in Pulmonary status d/t his oropharyngeal phase dysphagia and increased risk for aspiration - may need to consider alternative means of feeding in the future if ongoing decline in Pulmonary status and aspiration. Per Group Home report, pt was exhibiting s/s of aspiration w/ po intake("coughing with meals") prior to hospitalization. NSG and MD updated; agreed.     HPI HPI: Pt is a 82 y.o. male with a known history of Dementia, history of seizures, schizophrenia, hypertension who presented to the hospital from a group home secondary to altered mental status and also noted  to be tachycardic. Patient himself has advanced dementia and therefore history is unobtainable and mostly obtained from the chart and also from the ER physician. As per the ER physician patient reports to the hospital due to change in mental status. Pt has on a dysphagia diet since BSE performed but continues to present w/ a congested cough intermittently during meals. recent CXR revealed slightly increased patchy right lower lobe consolidation. No increase in temps or WBC at this time. Pt requires monitoring at meals d/t impulsive eating behavior suspect d/t Dementia. Pt is currently on a Dysphagia 1 w/ honey consistency liquids post MBSS yesterday(see report for details of the oropharyngeal phase dysphagia dx'd w/ risk for aspiration).      SLP Plan  Continue with current plan of care     Recommendations  Diet recommendations: Dysphagia 1 (puree);Honey-thick liquid Liquids provided via: Cup Medication Administration: Crushed with puree Supervision: Patient able to self feed;Intermittent supervision to cue for compensatory strategies (tray setup ) Compensations: Minimize environmental distractions;Slow rate;Small sips/bites;Multiple dry swallows after each bite/sip;Follow solids with liquid Postural Changes and/or Swallow Maneuvers: Seated upright 90 degrees;Upright 30-60 min after meal             General recommendations:  (dietician) Oral Care Recommendations: Oral care BID;Staff/trained caregiver to provide oral care Follow up Recommendations: Skilled Nursing facility Plan: Continue with current plan of care     GO               Jerilynn SomKatherine Quincy Boy, MS, CCC-SLP  Yoltzin Barg 09/02/2015, 1:38 PM

## 2015-09-02 NOTE — Discharge Summary (Addendum)
[facility] [place] [facility]   PATIENT NAME: Timothy [name]    MR#:  [id number]  DATE OF BIRTH:  09/05/1932  DATE OF ADMISSION:  08/29/2015 ADMITTING PHYSICIAN: Houston SirenVivek J Sainani, MD  DATE OF DISCHARGE: 09/02/15  PRIMARY CARE PHYSICIAN: No PCP Per Patient    ADMISSION DIAGNOSIS:  New onset atrial fibrillation (HCC) [I48.91] Dehydration, moderate [E86.0] Atrial fibrillation with RVR (HCC) [I48.91]  DISCHARGE DIAGNOSIS:  Active Problems:   Atrial fibrillation and flutter (HCC)   Pressure ulcer   SECONDARY DIAGNOSIS:   Past Medical History  Diagnosis Date  . Seizures (HCC)   . TIA (transient ischemic attack)   . Schizophrenia (HCC)   . Hypertension     HOSPITAL COURSE:   82 year old male with history of dementia, seizures, schizophrenia and hypertension presented from group home for change in mental status and also atrial fibrillation  #1 atrial fibrillation-new-onset. transient. Converted to normal sinus rhythm. - on oral amiodarone at 400 mg qdaily- after 5 more days- change to 200 mg qdaily - Not a good candidate for anticoagulation due to risk of falls -Cardiology consult appreciated. Echocardiogram with EF of 40%. -Correct electrolytes -TSH within normal limits. Slightly elevated free T4. Patient not on any thyroid supplements. Monitor and recheck in a few months.  #2 Aspiration Pneumonia- MBSS with high risk of aspiration, thickened liquids, aspiration precautions Likely will need PEG at some point if continues to get recurrent pneumonias on Zosyn. Change to oral ABX - augmentin at discharge  #3 seizure disorder-continue Keppra and Lamictal.  #4 hypertension-continue Norvasc  #5 advanced dementia-pleasantly confused, Currently oriented to self. Continue Namenda. Following commands. History of behavioral disturbance. None here  -Physical therapy consulted. Recommended higher level of care- to rehab today      DISCHARGE  CONDITIONS:   Guarded  CONSULTS OBTAINED:  Treatment Team:  Laurier NancyShaukat A Khan, MD  DRUG ALLERGIES:  No Known Allergies  DISCHARGE MEDICATIONS:   Discharge Medication List as of 09/02/2015  2:53 PM    START taking these medications   Details  amiodarone (PACERONE) 200 MG tablet Take 2 tablets (400 mg total) by mouth daily. X 5 days and then decrease to 200mg  (1tab) every day, Starting 09/02/2015, Until Discontinued, Normal    amoxicillin-clavulanate (AUGMENTIN) 875-125 MG tablet Take 1 tablet by mouth 2 (two) times daily. X 8 more days, Starting 09/02/2015, Until Discontinued, Normal    levETIRAcetam (KEPPRA) 100 MG/ML solution Take 15 mLs (1,500 mg total) by mouth 2 (two) times daily., Starting 09/02/2015, Until Discontinued, Normal      CONTINUE these medications which have NOT CHANGED   Details  acetaminophen (TYLENOL) 325 MG tablet Take 2 tablets (650 mg total) by mouth 2 (two) times daily., Starting 06/09/2015, Until Discontinued, Normal    amLODipine (NORVASC) 10 MG tablet Take 1 tablet (10 mg total) by mouth daily., Starting 06/09/2015, Until Discontinued, Normal    atorvastatin (LIPITOR) 20 MG tablet Take 0.5 tablets (10 mg total) by mouth at bedtime., Starting 06/09/2015, Until Discontinued, Normal    cholecalciferol (VITAMIN D) 1000 units tablet Take 1,000 Units by mouth daily., Until Discontinued, Historical Med    lamoTRIgine (LAMICTAL) 150 MG tablet Take 150 mg by mouth 2 (two) times daily., Until Discontinued, Historical Med    memantine (NAMENDA) 5 MG tablet Take 1 tablet (5 mg total) by mouth daily., Starting 06/09/2015, Until Discontinued, Print    vitamin B-12 (CYANOCOBALAMIN) 1000 MCG tablet Take 1 tablet (1,000 mcg total) by mouth daily.,  Starting 06/09/2015, Until Discontinued, Normal    ergocalciferol (VITAMIN D2) 50000 units capsule Take 1 capsule (50,000 Units total) by mouth once a week., Starting 06/09/2015, Until Discontinued, Normal      STOP taking these  medications     levETIRAcetam (KEPPRA) 750 MG tablet      potassium chloride SA (K-DUR,KLOR-CON) 20 MEQ tablet      doxycycline (VIBRA-TABS) 100 MG tablet          DISCHARGE INSTRUCTIONS:   1.  PCP f/u in 1-2 weeks 2.  Diet recommendations:   Diet: Dysphagia 1; honey consistency liquids by tsp or small amount by cup sip(monitored carefully by staff)   Swallowing Precautions: aspiration precautions; strategies to include multiple swallows, alternate food/liquid, cough/throat clearing intermittently during and at end of meals.   Extra gravy on meats/potaotes.  Magic Cup at [employer].     If you experience worsening of your admission symptoms, develop shortness of breath, life threatening emergency, suicidal or homicidal thoughts you must seek medical attention immediately by calling 911 or calling your MD immediately  if symptoms less severe.  You Must read complete instructions/literature along with all the possible adverse reactions/side effects for all the Medicines you take and that have been prescribed to you. Take any new Medicines after you have completely understood and accept all the possible adverse reactions/side effects.   Please note  You were cared for by a hospitalist during your hospital stay. If you have any questions about your discharge medications or the care you received while you were in the hospital after you are discharged, you can call the unit and asked to speak with the hospitalist on call if the hospitalist that took care of you is not available. Once you are discharged, your primary care physician will handle any further medical issues. Please note that NO REFILLS for any discharge medications will be authorized once you are discharged, as it is imperative that you return to your primary care physician (or establish a relationship with a primary care physician if you do not have one) for your aftercare needs so that they can reassess your need  for medications and monitor your lab values.    Today   CHIEF COMPLAINT:   Chief Complaint  Patient presents with  . Altered Mental Status    VITAL SIGNS:  Blood pressure 124/58, pulse 72, temperature 97.8 F (36.6 C), temperature source Oral, resp. rate 18, height 5\' 11"  (1.803 m), weight 65.817 kg (145 lb 1.6 oz), SpO2 96 %.  I/O:   Intake/Output Summary (Last 24 hours) at 09/02/15 1157 Last data filed at 09/02/15 1030  Gross per 24 hour  Intake    750 ml  Output    425 ml  Net    325 ml    PHYSICAL EXAMINATION:   Physical Exam  GENERAL: 82 y.o.-year-old patient lying in the bed with no acute distress. Disheveled appearing EYES: Pupils equal, round, reactive to light and accommodation. No scleral icterus. Extraocular muscles intact.  HEENT: Head atraumatic, normocephalic. Oropharynx and nasopharynx clear.  NECK: Supple, no jugular venous distention. No thyroid enlargement, no tenderness.  LUNGS: Normal breath sounds bilaterally, no wheezing, rales,rhonchi or crepitation. No use of accessory muscles of respiration. Decreased bibasilar breath sounds CARDIOVASCULAR: S1, S2 normal. No murmurs, rubs, or gallops.  ABDOMEN: Soft, nontender, nondistended. Bowel sounds present. No organomegaly or mass.  EXTREMITIES: No pedal edema, cyanosis, or clubbing. Right hand in mitten NEUROLOGIC: Awake and alert, following simple commands, moving  both arms easily, weakness in lower extremities noted but still 3/5 strength PSYCHIATRIC: The patient is alert and oriented x 2.  SKIN: No obvious rash, lesion, or ulcer.   DATA REVIEW:   CBC  Recent Labs Lab 08/31/15 0453  WBC 7.6  HGB 11.2*  HCT 33.7*  PLT 242    Chemistries   Recent Labs Lab 08/29/15 1535 08/30/15 0232  09/01/15 0820  NA 147* 148*  < > 141  K 3.3* 2.8*  < > 4.2  CL 108 111  < > 107  CO2 30 28  < > 28  GLUCOSE 115* 136*  < > 92  BUN 39* 29*  < > 15  CREATININE 1.28* 1.14  < > 1.02  CALCIUM 9.4  8.8*  < > 8.8*  MG 2.6* 2.3  --   --   AST 290*  --   --   --   ALT 136*  --   --   --   ALKPHOS 71  --   --   --   BILITOT 1.2  --   --   --   < > = values in this interval not displayed.  Cardiac Enzymes  Recent Labs Lab 08/30/15 0624  TROPONINI 0.03    Microbiology Results  Results for orders placed or performed during the hospital encounter of 08/29/15  Blood Culture (routine x 2)     Status: None (Preliminary result)   Collection Time: 08/29/15  3:35 PM  Result Value Ref Range Status   Specimen Description BLOOD RIGHT ANTECUBITAL  Final   Special Requests BOTTLES DRAWN AEROBIC AND ANAEROBIC  2CC  Final   Culture NO GROWTH 3 DAYS  Final   Report Status PENDING  Incomplete  Blood Culture (routine x 2)     Status: None (Preliminary result)   Collection Time: 08/29/15  3:38 PM  Result Value Ref Range Status   Specimen Description BLOOD LEFT FOREARM  Final   Special Requests BOTTLES DRAWN AEROBIC AND ANAEROBIC  1CC  Final   Culture NO GROWTH 3 DAYS  Final   Report Status PENDING  Incomplete  Urine culture     Status: Abnormal   Collection Time: 08/29/15  8:05 PM  Result Value Ref Range Status   Specimen Description URINE, RANDOM  Final   Special Requests NONE  Final   Culture MULTIPLE SPECIES PRESENT, SUGGEST RECOLLECTION (A)  Final   Report Status 08/31/2015 FINAL  Final  Culture, sputum-assessment     Status: None   Collection Time: 08/29/15  9:35 PM  Result Value Ref Range Status   Specimen Description EXPECTORATED SPUTUM  Final   Special Requests NONE  Final   Sputum evaluation THIS SPECIMEN IS ACCEPTABLE FOR SPUTUM CULTURE  Final   Report Status 08/29/2015 FINAL  Final  Culture, respiratory (NON-Expectorated)     Status: None   Collection Time: 08/29/15  9:35 PM  Result Value Ref Range Status   Specimen Description EXPECTORATED SPUTUM  Final   Special Requests NONE Reflexed from [id number]  Final   Gram Stain   Final    ABUNDANT WBC PRESENT, PREDOMINANTLY PMN FEW  YEAST RARE GRAM POSITIVE COCCI Performed at [facility]    Culture MODERATE CANDIDA ALBICANS  Final   Report Status 09/02/2015 FINAL  Final  Surgical pcr screen     Status: None   Collection Time: 08/29/15 10:33 PM  Result Value Ref Range Status   MRSA, PCR NEGATIVE NEGATIVE Final   Staphylococcus  aureus NEGATIVE NEGATIVE Final    Comment:        The Xpert SA Assay (FDA approved for NASAL specimens in patients over 58 years of age), is one component of a comprehensive surveillance program.  Test performance has been validated by [facility] for patients greater than or equal to 33 year old. It is not intended to diagnose infection nor to guide or monitor treatment.     RADIOLOGY:  No results found.  EKG:   Orders placed or performed during the hospital encounter of 08/29/15  . EKG 12-Lead  . EKG 12-Lead      Management plans discussed with the patient, family and they are in agreement.  CODE STATUS:     Code Status Orders        Start     Ordered   08/29/15 2210  Full code   Continuous     08/29/15 2209    Code Status History    Date Active Date Inactive Code Status Order ID Comments User Context   This patient has a current code status but no historical code status.      TOTAL TIME TAKING CARE OF THIS PATIENT: 37 minutes.    Enid Baas M.D on 09/02/2015 at 11:57 AM  Between 7am to 6pm - Pager - [phone]  After 6pm go to [email or web address] - password [email or web address] [facility]  [place] [facility]  Office  [phone]  CC: Primary care physician; No PCP Per Patient

## 2015-09-02 NOTE — Care Management Important Message (Signed)
Important Message  Patient Details  Name: Timothy [name] MRN: [id number] Date of Birth: 1932/09/22   Medicare Important Message Given:  Yes    Olegario MessierKathy A Dhanush Jokerst 09/02/2015, 2:13 PM

## 2015-09-02 NOTE — Progress Notes (Signed)
Report called to Selena BattenKim at [facility], EMS called for transport. Trudee KusterBrandi R Mansfield

## 2015-09-03 LAB — CULTURE, BLOOD (ROUTINE X 2)
CULTURE: NO GROWTH
Culture: NO GROWTH

## 2015-09-05 DIAGNOSIS — F0390 Unspecified dementia without behavioral disturbance: Secondary | ICD-10-CM | POA: Diagnosis not present

## 2015-09-05 DIAGNOSIS — I10 Essential (primary) hypertension: Secondary | ICD-10-CM | POA: Diagnosis not present

## 2015-09-05 DIAGNOSIS — J189 Pneumonia, unspecified organism: Secondary | ICD-10-CM | POA: Diagnosis not present

## 2015-09-05 DIAGNOSIS — R569 Unspecified convulsions: Secondary | ICD-10-CM | POA: Diagnosis not present

## 2015-09-05 DIAGNOSIS — I4891 Unspecified atrial fibrillation: Secondary | ICD-10-CM | POA: Diagnosis not present

## 2015-09-05 DIAGNOSIS — E784 Other hyperlipidemia: Secondary | ICD-10-CM | POA: Diagnosis not present

## 2015-09-10 ENCOUNTER — Other Ambulatory Visit: Payer: Self-pay

## 2015-09-12 DIAGNOSIS — R569 Unspecified convulsions: Secondary | ICD-10-CM | POA: Diagnosis not present

## 2015-09-12 DIAGNOSIS — I10 Essential (primary) hypertension: Secondary | ICD-10-CM | POA: Diagnosis not present

## 2015-09-12 DIAGNOSIS — E784 Other hyperlipidemia: Secondary | ICD-10-CM | POA: Diagnosis not present

## 2015-09-12 DIAGNOSIS — F0390 Unspecified dementia without behavioral disturbance: Secondary | ICD-10-CM | POA: Diagnosis not present

## 2015-09-12 DIAGNOSIS — I4891 Unspecified atrial fibrillation: Secondary | ICD-10-CM | POA: Diagnosis not present

## 2015-09-12 DIAGNOSIS — J189 Pneumonia, unspecified organism: Secondary | ICD-10-CM | POA: Diagnosis not present

## 2015-09-23 DIAGNOSIS — E784 Other hyperlipidemia: Secondary | ICD-10-CM | POA: Diagnosis not present

## 2015-09-23 DIAGNOSIS — I10 Essential (primary) hypertension: Secondary | ICD-10-CM | POA: Diagnosis not present

## 2015-09-23 DIAGNOSIS — J690 Pneumonitis due to inhalation of food and vomit: Secondary | ICD-10-CM | POA: Diagnosis not present

## 2015-09-23 DIAGNOSIS — F0390 Unspecified dementia without behavioral disturbance: Secondary | ICD-10-CM | POA: Diagnosis not present

## 2015-09-23 DIAGNOSIS — I4891 Unspecified atrial fibrillation: Secondary | ICD-10-CM | POA: Diagnosis not present

## 2015-10-02 ENCOUNTER — Encounter: Payer: Self-pay | Admitting: Family Medicine

## 2015-10-02 ENCOUNTER — Ambulatory Visit (INDEPENDENT_AMBULATORY_CARE_PROVIDER_SITE_OTHER): Payer: Medicare Other | Admitting: Family Medicine

## 2015-10-02 VITALS — BP 102/78 | HR 56 | Resp 16

## 2015-10-02 DIAGNOSIS — Z720 Tobacco use: Secondary | ICD-10-CM | POA: Diagnosis not present

## 2015-10-02 DIAGNOSIS — Z09 Encounter for follow-up examination after completed treatment for conditions other than malignant neoplasm: Secondary | ICD-10-CM

## 2015-10-02 DIAGNOSIS — I679 Cerebrovascular disease, unspecified: Secondary | ICD-10-CM | POA: Diagnosis not present

## 2015-10-02 DIAGNOSIS — F17200 Nicotine dependence, unspecified, uncomplicated: Secondary | ICD-10-CM

## 2015-10-02 NOTE — Progress Notes (Signed)
Name: Timothy [name]   MRN: [id number]    DOB: Apr 20, 1932   Date:10/02/2015       Progress Note  Subjective  Chief Complaint  Chief Complaint  Patient presents with  . Atrial Fibrillation    Has had some pain in right arm and weak all over starting yesterday. Was hospitalized several weeks for small stroke and released to [facility] and was sent home 09/25/2015 but has declined since.     Atrial Fibrillation Presents for follow-up visit. Symptoms are negative for an AICD problem, bradycardia, chest pain, dizziness, hemodynamic instability, hypertension, hypotension, pacemaker problem, palpitations, shortness of breath, syncope, tachycardia and weakness. Past medical history includes atrial fibrillation.  Neurologic Problem The patient's primary symptoms include a loss of balance. The patient's pertinent negatives include no altered mental status, clumsiness, focal sensory loss, focal weakness, memory loss, near-syncope, slurred speech or weakness. Primary symptoms comment: tia/resolved. This is a chronic problem. There was no focality noted. Pertinent negatives include no abdominal pain, back pain, chest pain, dizziness, fever, headaches, nausea, neck pain, palpitations or shortness of breath.    No problem-specific assessment & plan notes found for this encounter.   Past Medical History  Diagnosis Date  . Seizures (HCC)   . TIA (transient ischemic attack)   . Schizophrenia (HCC)   . Hypertension     History reviewed. No pertinent past surgical history.  History reviewed. No pertinent family history.  Social History   Social History  . Marital Status: Widowed    Spouse Name: N/A  . Number of Children: N/A  . Years of Education: N/A   Occupational History  . Not on file.   Social History Main Topics  . Smoking status: Current Every Day Smoker -- 0.25 packs/day for 25 years    Types: Cigarettes  . Smokeless tobacco: Not on file  . Alcohol Use: No  . Drug Use: No  .  Sexual Activity: Not Currently   Other Topics Concern  . Not on file   Social History Narrative    No Known Allergies   Review of Systems  Constitutional: Negative for fever, chills, weight loss and malaise/fatigue.  HENT: Negative for ear discharge, ear pain and sore throat.   Eyes: Negative for blurred vision.  Respiratory: Negative for cough, sputum production, shortness of breath and wheezing.   Cardiovascular: Negative for chest pain, palpitations, leg swelling, syncope and near-syncope.  Gastrointestinal: Negative for heartburn, nausea, abdominal pain, diarrhea, constipation, blood in stool and melena.  Genitourinary: Negative for dysuria, urgency, frequency and hematuria.  Musculoskeletal: Negative for myalgias, back pain, joint pain and neck pain.  Skin: Negative for rash.  Neurological: Positive for loss of balance. Negative for dizziness, tingling, sensory change, focal weakness, weakness and headaches.  Endo/Heme/Allergies: Negative for environmental allergies and polydipsia. Does not bruise/bleed easily.  Psychiatric/Behavioral: Negative for depression, suicidal ideas and memory loss. The patient is not nervous/anxious and does not have insomnia.      Objective  Filed Vitals:   10/02/15 1054  BP: 102/78  Pulse: 56  Resp: 16  SpO2: 97%    Physical Exam  Constitutional: He is oriented to person, place, and time and well-developed, well-nourished, and in no distress.  HENT:  Head: Normocephalic.  Right Ear: External ear normal.  Left Ear: External ear normal.  Nose: Nose normal.  Mouth/Throat: Oropharynx is clear and moist.  Eyes: Conjunctivae and EOM are normal. Pupils are equal, round, and reactive to light. Right eye exhibits no discharge. Left eye  exhibits no discharge. No scleral icterus.  Neck: Normal range of motion. Neck supple. No JVD present. No tracheal deviation present. No thyromegaly present.  Cardiovascular: Normal rate, regular rhythm, normal  heart sounds and intact distal pulses.  Exam reveals no gallop and no friction rub.   No murmur heard. Pulmonary/Chest: Breath sounds normal. No respiratory distress. He has no wheezes. He has no rales.  Abdominal: Soft. Bowel sounds are normal. He exhibits no mass. There is no hepatosplenomegaly. There is no tenderness. There is no rebound, no guarding and no CVA tenderness.  Musculoskeletal: Normal range of motion. He exhibits no edema or tenderness.  Lymphadenopathy:    He has no cervical adenopathy.  Neurological: He is alert and oriented to person, place, and time. He has normal sensation, normal strength, normal reflexes and intact cranial nerves. No cranial nerve deficit.  Skin: Skin is warm. No rash noted.  Psychiatric: Mood and affect normal.      Assessment & Plan  Problem List Items Addressed This Visit      Cardiovascular and Mediastinum   Cerebral vascular disease    Other Visit Diagnoses    Smoker    -  Primary    Relevant Orders    Nurse to provide smoking / tobacco cessation education    Hospital discharge follow-up             Dr. Elizabeth Sauereanna Menashe Kafer [facility]  10/02/2015

## 2015-10-06 ENCOUNTER — Other Ambulatory Visit: Payer: Self-pay

## 2015-10-06 DIAGNOSIS — E538 Deficiency of other specified B group vitamins: Secondary | ICD-10-CM

## 2015-10-06 DIAGNOSIS — M1990 Unspecified osteoarthritis, unspecified site: Secondary | ICD-10-CM

## 2015-10-06 DIAGNOSIS — E785 Hyperlipidemia, unspecified: Secondary | ICD-10-CM

## 2015-10-06 DIAGNOSIS — I10 Essential (primary) hypertension: Secondary | ICD-10-CM

## 2015-10-06 DIAGNOSIS — F0390 Unspecified dementia without behavioral disturbance: Secondary | ICD-10-CM

## 2015-10-06 MED ORDER — MEMANTINE HCL 5 MG PO TABS: 5.0000 mg | ORAL_TABLET | Freq: Every day | ORAL | 5 refills | Status: DC

## 2015-10-06 MED ORDER — ACETAMINOPHEN 325 MG PO TABS: 650.0000 mg | ORAL_TABLET | Freq: Two times a day (BID) | ORAL | 5 refills | Status: DC

## 2015-10-06 MED ORDER — AMLODIPINE BESYLATE 10 MG PO TABS: 10.0000 mg | ORAL_TABLET | Freq: Every day | ORAL | 5 refills | Status: DC

## 2015-10-06 MED ORDER — VITAMIN D3 25 MCG (1000 UNIT) PO TABS: 1000.0000 [IU] | ORAL_TABLET | Freq: Every day | ORAL | 5 refills | Status: DC

## 2015-10-06 MED ORDER — VITAMIN B-12 1000 MCG PO TABS: 1000.0000 ug | ORAL_TABLET | Freq: Every day | ORAL | 5 refills | Status: DC

## 2015-10-06 MED ORDER — ATORVASTATIN CALCIUM 10 MG PO TABS: 10.0000 mg | ORAL_TABLET | Freq: Every day | ORAL | 5 refills | Status: DC

## 2016-01-09 ENCOUNTER — Emergency Department
Admission: EM | Admit: 2016-01-09 | Discharge: 2016-01-09 | Disposition: A | Discharge status: Home / Self Care | Payer: Medicare Other | Attending: Emergency Medicine | Admitting: Emergency Medicine

## 2016-01-09 ENCOUNTER — Encounter: Payer: Self-pay | Admitting: Emergency Medicine

## 2016-01-09 DIAGNOSIS — I10 Essential (primary) hypertension: Secondary | ICD-10-CM | POA: Insufficient documentation

## 2016-01-09 DIAGNOSIS — Z79899 Other long term (current) drug therapy: Secondary | ICD-10-CM | POA: Insufficient documentation

## 2016-01-09 DIAGNOSIS — R531 Weakness: Secondary | ICD-10-CM | POA: Diagnosis not present

## 2016-01-09 DIAGNOSIS — R630 Anorexia: Secondary | ICD-10-CM | POA: Diagnosis present

## 2016-01-09 DIAGNOSIS — M6281 Muscle weakness (generalized): Secondary | ICD-10-CM | POA: Diagnosis not present

## 2016-01-09 DIAGNOSIS — R109 Unspecified abdominal pain: Secondary | ICD-10-CM | POA: Diagnosis not present

## 2016-01-09 DIAGNOSIS — F17210 Nicotine dependence, cigarettes, uncomplicated: Secondary | ICD-10-CM | POA: Diagnosis not present

## 2016-01-09 LAB — COMPREHENSIVE METABOLIC PANEL
ALT: 21 U/L (ref 17–63)
ANION GAP: 10 (ref 5–15)
AST: 35 U/L (ref 15–41)
Albumin: 4.2 g/dL (ref 3.5–5.0)
Alkaline Phosphatase: 93 U/L (ref 38–126)
BUN: 19 mg/dL (ref 6–20)
CHLORIDE: 105 mmol/L (ref 101–111)
CO2: 28 mmol/L (ref 22–32)
Calcium: 9.8 mg/dL (ref 8.9–10.3)
Creatinine, Ser: 0.89 mg/dL (ref 0.61–1.24)
GFR calc Af Amer: >60 mL/min (ref >60)
Glucose, Bld: 72 mg/dL (ref 65–99)
POTASSIUM: 3.6 mmol/L (ref 3.5–5.1)
Sodium: 143 mmol/L (ref 135–145)
TOTAL PROTEIN: 8.1 g/dL (ref 6.5–8.1)
Total Bilirubin: 0.8 mg/dL (ref 0.3–1.2)

## 2016-01-09 LAB — CBC
HCT: 46.5 % (ref 40.0–52.0)
Hemoglobin: 15.9 g/dL (ref 13.0–18.0)
MCH: 27.7 pg (ref 26.0–34.0)
MCHC: 34.2 g/dL (ref 32.0–36.0)
MCV: 81.1 fL (ref 80.0–100.0)
PLATELETS: 154 10*3/uL (ref 150–440)
RBC: 5.74 MIL/uL (ref 4.40–5.90)
RDW: 14.9 % — AB (ref 11.5–14.5)
WBC: 3.9 10*3/uL (ref 3.8–10.6)

## 2016-01-09 LAB — TROPONIN I

## 2016-01-09 LAB — LIPASE, BLOOD: LIPASE: 16 U/L (ref 11–51)

## 2016-01-09 MED ORDER — SODIUM CHLORIDE 0.9 % IV SOLN
1000.0000 mL | Freq: Once | INTRAVENOUS | Status: AC
  Administered 2016-01-09: 1000 mL via INTRAVENOUS

## 2016-01-09 NOTE — ED Triage Notes (Signed)
Pt from group home, to bed 12h via EMS.  EMS report group home roommate report pt was lethargic and not eating x 2 days.  Upon arrival, pt denies pain, in NAD, reports being hungry.  Per EMS BG 73.

## 2016-01-09 NOTE — ED Notes (Addendum)
Called group home, waiting for them to pick him up.

## 2016-01-09 NOTE — ED Provider Notes (Signed)
[facility] Emergency Department Provider Note   ____________________________________________    I have reviewed the triage vital signs and the nursing notes.   HISTORY  Chief Complaint Anorexia     HPI Timothy [name] is a 82 y.o. male presents with reported weakness. Patient apparently has been sleeping more than typical for him at his group home. He has also had a decreased appetite. He denies complaints. He says he feels well. Denies abdominal pain. He reports he is hungry. No fevers or chills. No nausea or vomiting.   Past Medical History:  Diagnosis Date  . Hypertension   . Schizophrenia (HCC)   . Seizures (HCC)   . TIA (transient ischemic attack)     Patient Active Problem List   Diagnosis Date Noted  . Pressure ulcer 08/30/2015  . Atrial fibrillation and flutter (HCC) 08/29/2015  . Cerebral vascular disease 06/09/2015  . B12 deficiency 06/09/2015  . Hypokalemia 06/09/2015  . 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 Seizures (HCC) 11/28/2014  . TIA (transient ischemic attack) 11/28/2014  . Schizophrenia (HCC) 11/28/2014  . Hypertension 11/28/2014  . Dyslipidemia 11/28/2014  . Dementia 11/28/2014    History reviewed. No pertinent surgical history.  Prior to Admission medications   Medication Sig Start Date End Date Taking? Authorizing Provider  acetaminophen (TYLENOL) 325 MG tablet Take 2 tablets (650 mg total) by mouth 2 (two) times daily. As needed 10/06/15   Duanne Limerickeanna C Jones, MD  amiodarone (PACERONE) 200 MG tablet Take 2 tablets (400 mg total) by mouth daily. X 5 days and then decrease to 200mg  (1tab) every day 09/02/15   Enid Baasadhika Kalisetti, MD  amLODipine (NORVASC) 10 MG tablet Take 1 tablet (10 mg total) by mouth daily. 10/06/15   Duanne Limerickeanna C Jones, MD  atorvastatin (LIPITOR) 10 MG tablet Take 1 tablet (10 mg total) by mouth daily. 10/06/15   Duanne Limerickeanna C Jones, MD  cholecalciferol (VITAMIN D) 1000 units tablet Take 1 tablet (1,000 Units  total) by mouth daily. 10/06/15   Duanne Limerickeanna C Jones, MD  doxycycline (DORYX) 100 MG EC tablet Take 100 mg by mouth 2 (two) times daily.    Historical Provider, MD  lamoTRIgine (LAMICTAL) 150 MG tablet Take 150 mg by mouth 2 (two) times daily.    Historical Provider, MD  levETIRAcetam (KEPPRA) 100 MG/ML solution Take 15 mLs (1,500 mg total) by mouth 2 (two) times daily. 09/02/15   Enid Baasadhika Kalisetti, MD  memantine (NAMENDA) 5 MG tablet Take 1 tablet (5 mg total) by mouth daily. 10/06/15   Duanne Limerickeanna C Jones, MD  OLANZapine (ZYPREXA) 10 MG tablet Take 10 mg by mouth at bedtime.    Historical Provider, MD  vitamin B-12 (CYANOCOBALAMIN) 1000 MCG tablet Take 1 tablet (1,000 mcg total) by mouth daily. 10/06/15   Duanne Limerickeanna C Jones, MD     Allergies Review of patient's allergies indicates no known allergies.  History reviewed. No pertinent family history.  Social History Social History  Substance Use Topics  . Smoking status: Current Every Day Smoker    Packs/day: 0.25    Years: 25.00    Types: Cigarettes  . Smokeless tobacco: Never Used  . Alcohol use No    Review of Systems  Constitutional: No fever/chills Eyes: No visual changes.  ENT: No sore throat. Cardiovascular: Denies chest pain. Respiratory: Denies shortness of breath.No cough Gastrointestinal: No abdominal pain.  No nausea, no vomiting.   Genitourinary: Negative for dysuria. Musculoskeletal: Negative for back pain. Skin: Negative for rash. Neurological: Negative  for headaches or weakness  10-point ROS otherwise negative.  ____________________________________________   PHYSICAL EXAM:  VITAL SIGNS: ED Triage Vitals  Enc Vitals Group     BP 01/09/16 2012 (!) 147/71     Pulse Rate 01/09/16 2012 87     Resp 01/09/16 2012 16     Temp 01/09/16 2012 98.3 F (36.8 C)     Temp Source 01/09/16 2012 Oral     SpO2 01/09/16 2012 97 %     Weight 01/09/16 2013 160 lb (72.6 kg)     Height 01/09/16 2013 5\' 10"  (1.778 m)     Head  Circumference --      [facility] Flow --      Pain Score 01/09/16 2013 0     Pain Loc --      Pain Edu? --      Excl. in GC? --     Constitutional: No acute distress. Pleasant and interactive Eyes: Conjunctivae are normal.   Nose: No congestion/rhinnorhea. Mouth/Throat: Mucous membranes are moist.    Cardiovascular: Normal rate, regular rhythm. Grossly normal heart sounds.  Good peripheral circulation. Respiratory: Normal respiratory effort.  No retractions. Lungs CTAB. Gastrointestinal: Soft and nontender. No distention.  No CVA tenderness. Genitourinary: deferred Musculoskeletal: No lower extremity tenderness nor edema.  Warm and well perfused Neurologic:  Normal speech and language. No gross focal neurologic deficits are appreciated.  Skin:  Skin is warm, dry and intact. No rash noted. Psychiatric: Mood and affect are normal. Speech and behavior are normal.  ____________________________________________   LABS (all labs ordered are listed, but only abnormal results are displayed)  Labs Reviewed  CBC - Abnormal; Notable for the following:       Result Value   RDW 14.9 (*)    All other components within normal limits  COMPREHENSIVE METABOLIC PANEL  LIPASE, BLOOD  TROPONIN I   ____________________________________________  EKG  ED ECG REPORT I, Jene EveryKINNER, Duaa Stelzner, the attending physician, personally viewed and interpreted this ECG.  Date: 01/09/2016 EKG Time: 9:55 PM Rate: 74 Rhythm: Atrial fibrillation QRS Axis: normal Intervals: normal ST/T Wave abnormalities: normal Conduction Disturbances: Left anterior fascicular block   ____________________________________________  RADIOLOGY  None ____________________________________________   PROCEDURES  Procedure(s) performed: No    Critical Care performed: No ____________________________________________   INITIAL IMPRESSION / ASSESSMENT AND PLAN / ED COURSE  Pertinent labs & imaging results that were available  during my care of the patient were reviewed by me and considered in my medical decision making (see chart for details).  Patient overall well-appearing and in no distress. Vital signs are unremarkable. We will check labs and give IV fluids and reevaluate  Clinical Course  Lab work is unremarkable. Patient remains well-appearing and he is anxious to go home. ____________________________________________   FINAL CLINICAL IMPRESSION(S) / ED DIAGNOSES  Final diagnoses:  Weakness generalized      NEW MEDICATIONS STARTED DURING THIS VISIT:  New Prescriptions   No medications on file     Note:  This document was prepared using Dragon voice recognition software and may include unintentional dictation errors.    Jene Everyobert Kimbely Whiteaker, MD 01/09/16 2255

## 2016-01-09 NOTE — ED Notes (Signed)
Pt discharged to group home team. Report given to them. And AVS given to them.

## 2016-01-15 ENCOUNTER — Other Ambulatory Visit: Payer: Self-pay

## 2016-01-15 DIAGNOSIS — I10 Essential (primary) hypertension: Secondary | ICD-10-CM

## 2016-01-15 MED ORDER — AMLODIPINE BESYLATE 10 MG PO TABS: 10.0000 mg | ORAL_TABLET | Freq: Every day | ORAL | 1 refills | Status: DC

## 2016-01-18 ENCOUNTER — Other Ambulatory Visit: Payer: Self-pay

## 2016-02-18 ENCOUNTER — Ambulatory Visit (INDEPENDENT_AMBULATORY_CARE_PROVIDER_SITE_OTHER): Payer: Medicare Other | Admitting: Family Medicine

## 2016-02-18 ENCOUNTER — Encounter: Payer: Self-pay | Admitting: Family Medicine

## 2016-02-18 VITALS — BP 126/86 | HR 74 | Temp 98.0°F | Ht 70.0 in | Wt 158.0 lb

## 2016-02-18 DIAGNOSIS — E785 Hyperlipidemia, unspecified: Secondary | ICD-10-CM

## 2016-02-18 DIAGNOSIS — I10 Essential (primary) hypertension: Secondary | ICD-10-CM | POA: Diagnosis not present

## 2016-02-18 DIAGNOSIS — E559 Vitamin D deficiency, unspecified: Secondary | ICD-10-CM

## 2016-02-18 DIAGNOSIS — M1990 Unspecified osteoarthritis, unspecified site: Secondary | ICD-10-CM

## 2016-02-18 DIAGNOSIS — E538 Deficiency of other specified B group vitamins: Secondary | ICD-10-CM | POA: Diagnosis not present

## 2016-02-18 DIAGNOSIS — Z593 Problems related to living in residential institution: Secondary | ICD-10-CM

## 2016-02-18 MED ORDER — VITAMIN B-12 1000 MCG PO TABS: 1000.0000 ug | ORAL_TABLET | Freq: Every day | ORAL | 5 refills | Status: DC

## 2016-02-18 MED ORDER — ACETAMINOPHEN 325 MG PO TABS: 650.0000 mg | ORAL_TABLET | Freq: Two times a day (BID) | ORAL | 5 refills | Status: AC

## 2016-02-18 MED ORDER — ATORVASTATIN CALCIUM 10 MG PO TABS: 10.0000 mg | ORAL_TABLET | Freq: Every day | ORAL | 5 refills | Status: DC

## 2016-02-18 MED ORDER — VITAMIN D3 25 MCG (1000 UNIT) PO TABS: 1000.0000 [IU] | ORAL_TABLET | Freq: Every day | ORAL | 5 refills | Status: DC

## 2016-02-18 MED ORDER — AMLODIPINE BESYLATE 10 MG PO TABS: 10.0000 mg | ORAL_TABLET | Freq: Every day | ORAL | 5 refills | Status: AC

## 2016-02-18 NOTE — Progress Notes (Signed)
Name: Timothy [name]   MRN: [id number]    DOB: [birth year]   Date:02/18/2016       Progress Note  Subjective  Chief Complaint  Chief Complaint  Patient presents with  . Follow-up  . Hypertension  . Heat Exposure    Hypertension  This is a chronic problem. The current episode started more than 1 year ago. The problem has been gradually improving since onset. The problem is controlled. Pertinent negatives include no anxiety, blurred vision, chest pain, headaches, malaise/fatigue, neck pain, orthopnea, palpitations, peripheral edema, PND, shortness of breath or sweats. There are no associated agents to hypertension. Past treatments include calcium channel blockers. The current treatment provides moderate improvement. There are no compliance problems.  There is no history of angina, kidney disease, CAD/MI, CVA, heart failure, left ventricular hypertrophy, PVD, renovascular disease or retinopathy. There is no history of chronic renal disease or a hypertension causing med.    No problem-specific Assessment & Plan notes found for this encounter.   Past Medical History:  Diagnosis Date  . Hypertension   . Schizophrenia (HCC)   . Seizures (HCC)   . TIA (transient ischemic attack)     No past surgical history on file.  No family history on file.  Social History   Social History  . Marital status: Widowed    Spouse name: N/A  . Number of children: N/A  . Years of education: N/A   Occupational History  . Not on file.   Social History Main Topics  . Smoking status: Current Every Day Smoker    Packs/day: 0.25    Years: 25.00    Types: Cigarettes  . Smokeless tobacco: Never Used  . Alcohol use No  . Drug use: No  . Sexual activity: Not Currently   Other Topics Concern  . Not on file   Social History Narrative  . No narrative on file    No Known Allergies   Review of Systems  Constitutional: Negative for chills, fever, malaise/fatigue and weight loss.  HENT: Negative  for ear discharge, ear pain and sore throat.   Eyes: Negative for blurred vision.  Respiratory: Negative for cough, sputum production, shortness of breath and wheezing.   Cardiovascular: Negative for chest pain, palpitations, orthopnea, leg swelling and PND.  Gastrointestinal: Negative for abdominal pain, blood in stool, constipation, diarrhea, heartburn, melena and nausea.  Genitourinary: Negative for dysuria, frequency, hematuria and urgency.  Musculoskeletal: Negative for back pain, joint pain, myalgias and neck pain.  Skin: Negative for rash.  Neurological: Negative for dizziness, tingling, sensory change, focal weakness and headaches.  Endo/Heme/Allergies: Negative for environmental allergies and polydipsia. Does not bruise/bleed easily.  Psychiatric/Behavioral: Negative for depression and suicidal ideas. The patient is not nervous/anxious and does not have insomnia.      Objective  Vitals:   02/18/16 1018  BP: 126/86  Pulse: 74  Temp: 98 F (36.7 C)  SpO2: 98%  Weight: 158 lb (71.7 kg)  Height: 5\' 10"  (1.778 m)    Physical Exam  Constitutional: He is oriented to person, place, and time and well-developed, well-nourished, and in no distress.  HENT:  Head: Normocephalic.  Right Ear: External ear normal.  Left Ear: External ear normal.  Nose: Nose normal.  Mouth/Throat: Oropharynx is clear and moist.  Eyes: Conjunctivae and EOM are normal. Pupils are equal, round, and reactive to light. Right eye exhibits no discharge. Left eye exhibits no discharge. No scleral icterus.  Neck: Normal range of motion. Neck supple. No JVD  present. No tracheal deviation present. No thyromegaly present.  Cardiovascular: Normal rate, regular rhythm, normal heart sounds and intact distal pulses.  Exam reveals no gallop and no friction rub.   No murmur heard. Pulmonary/Chest: Breath sounds normal. No respiratory distress. He has no wheezes. He has no rales.  Abdominal: Soft. Bowel sounds are  normal. He exhibits no mass. There is no hepatosplenomegaly. There is no tenderness. There is no rebound, no guarding and no CVA tenderness.  Musculoskeletal: Normal range of motion. He exhibits no edema or tenderness.  Lymphadenopathy:    He has no cervical adenopathy.  Neurological: He is alert and oriented to person, place, and time. He has normal sensation, normal strength, normal reflexes and intact cranial nerves. No cranial nerve deficit.  Skin: Skin is warm. No rash noted.  Psychiatric: Mood and affect normal.  Nursing note and vitals reviewed.     Assessment & Plan  Problem List Items Addressed This Visit      Cardiovascular and Mediastinum   Hypertension - Primary   Relevant Medications   amLODipine (NORVASC) 10 MG tablet   atorvastatin (LIPITOR) 10 MG tablet     Other   Dyslipidemia   Relevant Medications   atorvastatin (LIPITOR) 10 MG tablet   B12 deficiency   Relevant Medications   vitamin B-12 (CYANOCOBALAMIN) 1000 MCG tablet    Other Visit Diagnoses    Vitamin D deficiency       Relevant Medications   cholecalciferol (VITAMIN D) 1000 units tablet   Osteoarthritis, unspecified osteoarthritis type, unspecified site       Relevant Medications   acetaminophen (TYLENOL) 325 MG tablet   Nursing home resident            Dr. Hayden Rasmusseneanna Jones [facility]  02/18/16

## 2016-03-01 ENCOUNTER — Other Ambulatory Visit: Payer: Self-pay | Admitting: Family Medicine

## 2016-03-01 DIAGNOSIS — F0390 Unspecified dementia without behavioral disturbance: Secondary | ICD-10-CM

## 2016-03-25 ENCOUNTER — Emergency Department: Payer: Medicare Other

## 2016-03-25 ENCOUNTER — Emergency Department
Admission: EM | Admit: 2016-03-25 | Discharge: 2016-03-25 | Disposition: A | Discharge status: Home / Self Care | Payer: Medicare Other | Attending: Emergency Medicine | Admitting: Emergency Medicine

## 2016-03-25 ENCOUNTER — Encounter: Payer: Self-pay | Admitting: Emergency Medicine

## 2016-03-25 DIAGNOSIS — F17210 Nicotine dependence, cigarettes, uncomplicated: Secondary | ICD-10-CM | POA: Insufficient documentation

## 2016-03-25 DIAGNOSIS — R4020 Unspecified coma: Secondary | ICD-10-CM | POA: Diagnosis not present

## 2016-03-25 DIAGNOSIS — R4182 Altered mental status, unspecified: Secondary | ICD-10-CM

## 2016-03-25 DIAGNOSIS — Z79899 Other long term (current) drug therapy: Secondary | ICD-10-CM | POA: Diagnosis not present

## 2016-03-25 DIAGNOSIS — R569 Unspecified convulsions: Secondary | ICD-10-CM

## 2016-03-25 DIAGNOSIS — I10 Essential (primary) hypertension: Secondary | ICD-10-CM | POA: Diagnosis not present

## 2016-03-25 DIAGNOSIS — G40909 Epilepsy, unspecified, not intractable, without status epilepticus: Secondary | ICD-10-CM | POA: Diagnosis not present

## 2016-03-25 DIAGNOSIS — Z9119 Patient's noncompliance with other medical treatment and regimen: Secondary | ICD-10-CM | POA: Diagnosis not present

## 2016-03-25 LAB — COMPREHENSIVE METABOLIC PANEL
ALT: 18 U/L (ref 17–63)
AST: 24 U/L (ref 15–41)
Albumin: 3.6 g/dL (ref 3.5–5.0)
Alkaline Phosphatase: 70 U/L (ref 38–126)
Anion gap: 6 (ref 5–15)
BUN: 14 mg/dL (ref 6–20)
CHLORIDE: 107 mmol/L (ref 101–111)
CO2: 31 mmol/L (ref 22–32)
CREATININE: 0.95 mg/dL (ref 0.61–1.24)
Calcium: 9.5 mg/dL (ref 8.9–10.3)
GFR calc Af Amer: >60 mL/min (ref >60)
GLUCOSE: 80 mg/dL (ref 65–99)
Potassium: 3.3 mmol/L — ABNORMAL LOW (ref 3.5–5.1)
Sodium: 144 mmol/L (ref 135–145)
Total Bilirubin: 0.5 mg/dL (ref 0.3–1.2)
Total Protein: 6.7 g/dL (ref 6.5–8.1)

## 2016-03-25 LAB — CBC WITH DIFFERENTIAL/PLATELET
BASOS ABS: 0 10*3/uL (ref 0–0.1)
Basophils Relative: 1 %
EOS PCT: 2 %
Eosinophils Absolute: 0.1 10*3/uL (ref 0–0.7)
HEMATOCRIT: 39.2 % — AB (ref 40.0–52.0)
Hemoglobin: 13.4 g/dL (ref 13.0–18.0)
LYMPHS PCT: 29 %
Lymphs Abs: 1.3 10*3/uL (ref 1.0–3.6)
MCH: 27.4 pg (ref 26.0–34.0)
MCHC: 34.2 g/dL (ref 32.0–36.0)
MCV: 80 fL (ref 80.0–100.0)
Monocytes Absolute: 0.6 10*3/uL (ref 0.2–1.0)
Monocytes Relative: 14 %
NEUTROS ABS: 2.4 10*3/uL (ref 1.4–6.5)
Neutrophils Relative %: 54 %
PLATELETS: 138 10*3/uL — AB (ref 150–440)
RBC: 4.9 MIL/uL (ref 4.40–5.90)
RDW: 14.8 % — ABNORMAL HIGH (ref 11.5–14.5)
WBC: 4.4 10*3/uL (ref 3.8–10.6)

## 2016-03-25 LAB — URINALYSIS, COMPLETE (UACMP) WITH MICROSCOPIC
BACTERIA UA: NONE SEEN
Bilirubin Urine: NEGATIVE
Glucose, UA: NEGATIVE mg/dL
Hgb urine dipstick: NEGATIVE
KETONES UR: NEGATIVE mg/dL
Leukocytes, UA: NEGATIVE
Nitrite: NEGATIVE
PH: 6 (ref 5.0–8.0)
PROTEIN: NEGATIVE mg/dL
Specific Gravity, Urine: 1.017 (ref 1.005–1.030)

## 2016-03-25 LAB — TROPONIN I

## 2016-03-25 LAB — CARBAMAZEPINE LEVEL, TOTAL

## 2016-03-25 NOTE — ED Notes (Signed)
Patient given meal tray.

## 2016-03-25 NOTE — ED Triage Notes (Signed)
[facility], staff stated that they had been trying to wake patient up for 1 hour but they were not able to get patient to wake up. EMS reports that patient had been unresponsive with them. Upon arrival to ED patient had eyes open, patient will track with eye but is non-verbal at this time.  EMS reports that group home staff told them the other day that patient felt like he may have had a seizure. Patient was not evaluated at that time.

## 2016-03-25 NOTE — ED Provider Notes (Signed)
[facility] Emergency Department Provider Note    L5 caveat: Review of systems and history is limited by altered mental status    Time seen: ----------------------------------------- 8:19 AM on 03/25/2016 -----------------------------------------    I have reviewed the triage vital signs and the nursing notes.   HISTORY  Chief Complaint No chief complaint on file.    HPI Timothy [name] is a 83 y.o. male who presents to the ER being brought by EMS for altered mental status and possible unresponsiveness. Patient lives in a group home and recently was reported to possibly have had a seizure. EMS was not called that time yesterday. Today he was . unresponsive and EMS was called for evaluation. Patient is nonverbal on arrival.   Past Medical History:  Diagnosis Date  . Hypertension   . Schizophrenia (HCC)   . Seizures (HCC)   . TIA (transient ischemic attack)     Patient Active Problem List   Diagnosis Date Noted  . Pressure ulcer 08/30/2015  . Atrial fibrillation and flutter (HCC) 08/29/2015  . Cerebral vascular disease 06/09/2015  . B12 deficiency 06/09/2015  . Hypokalemia 06/09/2015  . 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 Seizures (HCC) 11/28/2014  . TIA (transient ischemic attack) 11/28/2014  . Schizophrenia (HCC) 11/28/2014  . Hypertension 11/28/2014  . Dyslipidemia 11/28/2014  . Dementia 11/28/2014    No past surgical history on file.  Allergies Patient has no known allergies.  Social History Social History  Substance Use Topics  . Smoking status: Current Every Day Smoker    Packs/day: 0.25    Years: 25.00    Types: Cigarettes  . Smokeless tobacco: Never Used  . Alcohol use No    Review of Systems Unknown at this time  ____________________________________________   PHYSICAL EXAM:  VITAL SIGNS: ED Triage Vitals  Enc Vitals Group     BP      Pulse      Resp      Temp      Temp src      SpO2      Weight    Height      Head Circumference      Peak Flow      Pain Score      Pain Loc      Pain Edu?      Excl. in GC?     Constitutional: Patient does track with his eyes, no obvious distress Eyes: Conjunctivae are normal. Pupils are pinpoint and equal bilaterally, Normal extraocular movements. ENT   Head: Normocephalic and atraumatic.   Nose: No congestion/rhinnorhea.   Mouth/Throat: Mucous membranes are moist. Tongue dry are noted   Neck: No stridor. Cardiovascular: Normal rate, regular rhythm. No murmurs, rubs, or gallops. Respiratory: Normal respiratory effort without tachypnea nor retractions. Breath sounds are clear and equal bilaterally. No wheezes/rales/rhonchi. Gastrointestinal: Soft and nontender. Normal bowel sounds Musculoskeletal: Nontender with normal range of motion in all extremities. No lower extremity tenderness nor edema. Neurologic:  Patient is nonverbal at this time, he appears to be moving his extremities equally. Skin:  Skin is warm, dry and intact. No rash noted. ____________________________________________  EKG: Interpreted by me. Sinus rhythm rate of 62 bpm, normal PR interval, wide QRS, left bundle branch block. Normal QT interval  ____________________________________________  ED COURSE:  Pertinent labs & imaging results that were available during my care of the patient were reviewed by me and considered in my medical decision making (see chart for details). Clinical Course   Patient  presents to ER with altered mental status. We will assess with labs and imaging.  Procedures ____________________________________________   LABS (pertinent positives/negatives)  Labs Reviewed  CBC WITH DIFFERENTIAL/PLATELET - Abnormal; Notable for the following:       Result Value   HCT 39.2 (*)    RDW 14.8 (*)    Platelets 138 (*)    All other components within normal limits  COMPREHENSIVE METABOLIC PANEL - Abnormal; Notable for the following:    Potassium 3.3  (*)    All other components within normal limits  URINALYSIS, COMPLETE (UACMP) WITH MICROSCOPIC - Abnormal; Notable for the following:    Color, Urine YELLOW (*)    APPearance CLEAR (*)    Squamous Epithelial / LPF 0-5 (*)    All other components within normal limits  CARBAMAZEPINE LEVEL, TOTAL - Abnormal; Notable for the following:    Carbamazepine Lvl <2.0 (*)    All other components within normal limits  TROPONIN I    RADIOLOGY Images were viewed by me  CT head IMPRESSION: Mild atrophy with mild periventricular small vessel disease. No intracranial mass, hemorrhage, or extra-axial fluid collection. No acute appearing infarct.  Foci of arterial vascular calcification noted.  Areas of paranasal sinus disease. Probable old trauma medial left orbital wall, unchanged. Probable cerumen right external auditory canal. ____________________________________________  FINAL ASSESSMENT AND PLAN  Altered mental status, Seizure, medication noncompliance  Plan: Patient with labs and imaging as dictated above. Patient with seizure likely secondary to medication noncompliance. His medications were reconciled and he was found to be taking Tegretol and Keppra. His Tegretol level is undetectable. We will encourage the group home facility to make sure the section taking the medicine. He is stable for discharge at this time.   Emily FilbertWilliams, Kyre Jeffries E, MD   Note: This dictation was prepared with Dragon dictation. Any transcriptional errors that result from this process are unintentional    Emily FilbertJonathan E Bertrand Vowels, MD 03/25/16 1311

## 2016-03-25 NOTE — ED Notes (Signed)
This RN in room with patient, patient coughed and then began talking. Patient able to answer questions appropriately at this time. Pt being transported to CT at this time.

## 2016-03-25 NOTE — ED Notes (Signed)
Called and requested meal tray for patient

## 2016-04-05 ENCOUNTER — Other Ambulatory Visit: Payer: Self-pay | Admitting: Family Medicine

## 2016-04-05 DIAGNOSIS — M1990 Unspecified osteoarthritis, unspecified site: Secondary | ICD-10-CM

## 2016-06-02 ENCOUNTER — Other Ambulatory Visit: Payer: Self-pay | Admitting: Family Medicine

## 2016-06-02 DIAGNOSIS — F0390 Unspecified dementia without behavioral disturbance: Secondary | ICD-10-CM

## 2016-06-03 ENCOUNTER — Other Ambulatory Visit: Payer: Self-pay

## 2016-06-03 ENCOUNTER — Other Ambulatory Visit: Payer: Self-pay | Admitting: Family Medicine

## 2016-06-03 DIAGNOSIS — F0390 Unspecified dementia without behavioral disturbance: Secondary | ICD-10-CM

## 2016-08-31 ENCOUNTER — Other Ambulatory Visit: Payer: Self-pay | Admitting: Family Medicine

## 2016-08-31 DIAGNOSIS — E538 Deficiency of other specified B group vitamins: Secondary | ICD-10-CM

## 2016-08-31 DIAGNOSIS — F0390 Unspecified dementia without behavioral disturbance: Secondary | ICD-10-CM

## 2016-08-31 DIAGNOSIS — E785 Hyperlipidemia, unspecified: Secondary | ICD-10-CM

## 2016-09-26 ENCOUNTER — Other Ambulatory Visit: Payer: Self-pay | Admitting: Family Medicine

## 2016-09-26 DIAGNOSIS — E538 Deficiency of other specified B group vitamins: Secondary | ICD-10-CM

## 2016-09-30 ENCOUNTER — Other Ambulatory Visit: Payer: Self-pay | Admitting: Family Medicine

## 2016-09-30 DIAGNOSIS — M1990 Unspecified osteoarthritis, unspecified site: Secondary | ICD-10-CM

## 2016-09-30 DIAGNOSIS — F0390 Unspecified dementia without behavioral disturbance: Secondary | ICD-10-CM

## 2016-09-30 DIAGNOSIS — E538 Deficiency of other specified B group vitamins: Secondary | ICD-10-CM

## 2016-09-30 DIAGNOSIS — E785 Hyperlipidemia, unspecified: Secondary | ICD-10-CM

## 2016-10-12 DIAGNOSIS — E538 Deficiency of other specified B group vitamins: Secondary | ICD-10-CM | POA: Diagnosis not present

## 2016-10-12 DIAGNOSIS — I482 Chronic atrial fibrillation: Secondary | ICD-10-CM | POA: Diagnosis not present

## 2016-10-12 DIAGNOSIS — R569 Unspecified convulsions: Secondary | ICD-10-CM | POA: Diagnosis not present

## 2016-10-12 DIAGNOSIS — G301 Alzheimer's disease with late onset: Secondary | ICD-10-CM | POA: Diagnosis not present

## 2016-10-12 DIAGNOSIS — E785 Hyperlipidemia, unspecified: Secondary | ICD-10-CM | POA: Diagnosis not present

## 2016-10-12 DIAGNOSIS — Z720 Tobacco use: Secondary | ICD-10-CM | POA: Diagnosis not present

## 2016-10-12 DIAGNOSIS — I10 Essential (primary) hypertension: Secondary | ICD-10-CM | POA: Diagnosis not present

## 2016-10-28 ENCOUNTER — Other Ambulatory Visit: Payer: Self-pay | Admitting: Family Medicine

## 2016-10-28 DIAGNOSIS — M1990 Unspecified osteoarthritis, unspecified site: Secondary | ICD-10-CM

## 2016-11-15 DIAGNOSIS — G301 Alzheimer's disease with late onset: Secondary | ICD-10-CM | POA: Diagnosis not present

## 2016-11-15 DIAGNOSIS — I10 Essential (primary) hypertension: Secondary | ICD-10-CM | POA: Diagnosis not present

## 2016-11-15 DIAGNOSIS — E785 Hyperlipidemia, unspecified: Secondary | ICD-10-CM | POA: Diagnosis not present

## 2016-11-15 DIAGNOSIS — H6121 Impacted cerumen, right ear: Secondary | ICD-10-CM | POA: Diagnosis not present

## 2016-11-15 DIAGNOSIS — Z1329 Encounter for screening for other suspected endocrine disorder: Secondary | ICD-10-CM | POA: Diagnosis not present

## 2016-11-15 DIAGNOSIS — Z720 Tobacco use: Secondary | ICD-10-CM | POA: Diagnosis not present

## 2016-11-15 DIAGNOSIS — E538 Deficiency of other specified B group vitamins: Secondary | ICD-10-CM | POA: Diagnosis not present

## 2016-11-29 ENCOUNTER — Other Ambulatory Visit: Payer: Self-pay | Admitting: Family Medicine

## 2016-11-29 DIAGNOSIS — I10 Essential (primary) hypertension: Secondary | ICD-10-CM

## 2017-03-20 DIAGNOSIS — Z0000 Encounter for general adult medical examination without abnormal findings: Secondary | ICD-10-CM | POA: Diagnosis not present

## 2017-03-20 DIAGNOSIS — Z1389 Encounter for screening for other disorder: Secondary | ICD-10-CM | POA: Diagnosis not present

## 2017-04-21 DIAGNOSIS — Z720 Tobacco use: Secondary | ICD-10-CM | POA: Diagnosis not present

## 2017-04-21 DIAGNOSIS — E538 Deficiency of other specified B group vitamins: Secondary | ICD-10-CM | POA: Diagnosis not present

## 2017-04-21 DIAGNOSIS — R569 Unspecified convulsions: Secondary | ICD-10-CM | POA: Diagnosis not present

## 2017-04-21 DIAGNOSIS — I10 Essential (primary) hypertension: Secondary | ICD-10-CM | POA: Diagnosis not present

## 2017-04-21 DIAGNOSIS — I482 Chronic atrial fibrillation: Secondary | ICD-10-CM | POA: Diagnosis not present

## 2017-04-21 DIAGNOSIS — G301 Alzheimer's disease with late onset: Secondary | ICD-10-CM | POA: Diagnosis not present

## 2017-04-21 DIAGNOSIS — E785 Hyperlipidemia, unspecified: Secondary | ICD-10-CM | POA: Diagnosis not present

## 2017-05-22 ENCOUNTER — Other Ambulatory Visit: Payer: Self-pay | Admitting: Family Medicine

## 2017-05-22 DIAGNOSIS — I10 Essential (primary) hypertension: Secondary | ICD-10-CM

## 2017-05-23 ENCOUNTER — Other Ambulatory Visit: Payer: Self-pay | Admitting: Family Medicine

## 2017-05-23 DIAGNOSIS — I10 Essential (primary) hypertension: Secondary | ICD-10-CM

## 2018-01-11 DIAGNOSIS — I10 Essential (primary) hypertension: Secondary | ICD-10-CM | POA: Diagnosis not present

## 2018-01-11 DIAGNOSIS — Z720 Tobacco use: Secondary | ICD-10-CM | POA: Diagnosis not present

## 2018-01-11 DIAGNOSIS — E538 Deficiency of other specified B group vitamins: Secondary | ICD-10-CM | POA: Diagnosis not present

## 2018-01-11 DIAGNOSIS — G301 Alzheimer's disease with late onset: Secondary | ICD-10-CM | POA: Diagnosis not present

## 2018-01-11 DIAGNOSIS — E785 Hyperlipidemia, unspecified: Secondary | ICD-10-CM | POA: Diagnosis not present

## 2018-01-11 DIAGNOSIS — R569 Unspecified convulsions: Secondary | ICD-10-CM | POA: Diagnosis not present

## 2018-03-04 ENCOUNTER — Emergency Department
Admission: EM | Admit: 2018-03-04 | Discharge: 2018-03-05 | Disposition: A | Discharge status: Home / Self Care | Payer: Medicare Other | Attending: Emergency Medicine | Admitting: Emergency Medicine

## 2018-03-04 ENCOUNTER — Emergency Department: Payer: Medicare Other

## 2018-03-04 ENCOUNTER — Other Ambulatory Visit: Payer: Self-pay

## 2018-03-04 DIAGNOSIS — Z8673 Personal history of transient ischemic attack (TIA), and cerebral infarction without residual deficits: Secondary | ICD-10-CM | POA: Insufficient documentation

## 2018-03-04 DIAGNOSIS — F209 Schizophrenia, unspecified: Secondary | ICD-10-CM | POA: Diagnosis present

## 2018-03-04 DIAGNOSIS — F0390 Unspecified dementia without behavioral disturbance: Secondary | ICD-10-CM | POA: Diagnosis not present

## 2018-03-04 DIAGNOSIS — I4891 Unspecified atrial fibrillation: Secondary | ICD-10-CM | POA: Diagnosis not present

## 2018-03-04 DIAGNOSIS — E162 Hypoglycemia, unspecified: Secondary | ICD-10-CM | POA: Diagnosis not present

## 2018-03-04 DIAGNOSIS — F203 Undifferentiated schizophrenia: Secondary | ICD-10-CM | POA: Diagnosis not present

## 2018-03-04 DIAGNOSIS — F17210 Nicotine dependence, cigarettes, uncomplicated: Secondary | ICD-10-CM | POA: Insufficient documentation

## 2018-03-04 DIAGNOSIS — Z79899 Other long term (current) drug therapy: Secondary | ICD-10-CM | POA: Insufficient documentation

## 2018-03-04 DIAGNOSIS — R404 Transient alteration of awareness: Secondary | ICD-10-CM | POA: Diagnosis not present

## 2018-03-04 DIAGNOSIS — R4182 Altered mental status, unspecified: Secondary | ICD-10-CM | POA: Diagnosis not present

## 2018-03-04 DIAGNOSIS — F202 Catatonic schizophrenia: Secondary | ICD-10-CM | POA: Diagnosis not present

## 2018-03-04 DIAGNOSIS — E161 Other hypoglycemia: Secondary | ICD-10-CM | POA: Diagnosis not present

## 2018-03-04 DIAGNOSIS — I10 Essential (primary) hypertension: Secondary | ICD-10-CM | POA: Insufficient documentation

## 2018-03-04 DIAGNOSIS — R4020 Unspecified coma: Secondary | ICD-10-CM | POA: Diagnosis not present

## 2018-03-04 DIAGNOSIS — R569 Unspecified convulsions: Secondary | ICD-10-CM | POA: Diagnosis not present

## 2018-03-04 DIAGNOSIS — F29 Unspecified psychosis not due to a substance or known physiological condition: Secondary | ICD-10-CM | POA: Diagnosis not present

## 2018-03-04 LAB — GLUCOSE, CAPILLARY
GLUCOSE-CAPILLARY: 114 mg/dL — AB (ref 70–99)
GLUCOSE-CAPILLARY: 125 mg/dL — AB (ref 70–99)
GLUCOSE-CAPILLARY: 130 mg/dL — AB (ref 70–99)
GLUCOSE-CAPILLARY: 206 mg/dL — AB (ref 70–99)
GLUCOSE-CAPILLARY: 59 mg/dL — AB (ref 70–99)
Glucose-Capillary: 172 mg/dL — ABNORMAL HIGH (ref 70–99)
Glucose-Capillary: 209 mg/dL — ABNORMAL HIGH (ref 70–99)
Glucose-Capillary: 136 mg/dL — ABNORMAL HIGH (ref 70–99)
Glucose-Capillary: 120 mg/dL — ABNORMAL HIGH (ref 70–99)
Glucose-Capillary: 82 mg/dL (ref 70–99)
Glucose-Capillary: 73 mg/dL (ref 70–99)

## 2018-03-04 LAB — URINALYSIS, COMPLETE (UACMP) WITH MICROSCOPIC
BILIRUBIN URINE: NEGATIVE
Bacteria, UA: NONE SEEN
Glucose, UA: NEGATIVE mg/dL
Ketones, ur: 5 mg/dL — AB
LEUKOCYTES UA: NEGATIVE
Nitrite: NEGATIVE
Protein, ur: NEGATIVE mg/dL
Specific Gravity, Urine: 1.013 (ref 1.005–1.030)
pH: 7 (ref 5.0–8.0)

## 2018-03-04 LAB — COMPREHENSIVE METABOLIC PANEL
ALT: 21 U/L (ref 0–44)
ANION GAP: 9 (ref 5–15)
AST: 29 U/L (ref 15–41)
Albumin: 4.3 g/dL (ref 3.5–5.0)
Alkaline Phosphatase: 69 U/L (ref 38–126)
BUN: 11 mg/dL (ref 8–23)
CO2: 29 mmol/L (ref 22–32)
Calcium: 9.5 mg/dL (ref 8.9–10.3)
Chloride: 102 mmol/L (ref 98–111)
Creatinine, Ser: 0.88 mg/dL (ref 0.61–1.24)
GFR calc Af Amer: >60 mL/min (ref >60)
GFR calc non Af Amer: >60 mL/min (ref >60)
Glucose, Bld: 65 mg/dL — ABNORMAL LOW (ref 70–99)
Potassium: 3.7 mmol/L (ref 3.5–5.1)
Sodium: 140 mmol/L (ref 135–145)
Total Bilirubin: 1.4 mg/dL — ABNORMAL HIGH (ref 0.3–1.2)
Total Protein: 7.9 g/dL (ref 6.5–8.1)

## 2018-03-04 LAB — BLOOD GAS, VENOUS
Acid-Base Excess: 6.8 mmol/L — ABNORMAL HIGH (ref 0.0–2.0)
Bicarbonate: 33.7 mmol/L — ABNORMAL HIGH (ref 20.0–28.0)
O2 Saturation: 65.9 %
PATIENT TEMPERATURE: 37
pCO2, Ven: 57 mmHg (ref 44.0–60.0)
pH, Ven: 7.38 (ref 7.250–7.430)
pO2, Ven: 35 mmHg (ref 32.0–45.0)

## 2018-03-04 LAB — URINE DRUG SCREEN, QUALITATIVE (ARMC ONLY)
Amphetamines, Ur Screen: NOT DETECTED
Barbiturates, Ur Screen: NOT DETECTED
Benzodiazepine, Ur Scrn: NOT DETECTED
Cannabinoid 50 Ng, Ur ~~LOC~~: NOT DETECTED
Cocaine Metabolite,Ur ~~LOC~~: NOT DETECTED
MDMA (ECSTASY) UR SCREEN: NOT DETECTED
Methadone Scn, Ur: NOT DETECTED
Opiate, Ur Screen: NOT DETECTED
PHENCYCLIDINE (PCP) UR S: NOT DETECTED
Tricyclic, Ur Screen: NOT DETECTED

## 2018-03-04 LAB — CBC WITH DIFFERENTIAL/PLATELET
Abs Immature Granulocytes: 0.01 10*3/uL (ref 0.00–0.07)
BASOS ABS: 0 10*3/uL (ref 0.0–0.1)
Basophils Relative: 1 %
Eosinophils Absolute: 0.1 10*3/uL (ref 0.0–0.5)
Eosinophils Relative: 2 %
HCT: 48.8 % (ref 39.0–52.0)
Hemoglobin: 15.5 g/dL (ref 13.0–17.0)
Immature Granulocytes: 0 %
Lymphocytes Relative: 35 %
Lymphs Abs: 1.5 10*3/uL (ref 0.7–4.0)
MCH: 27.2 pg (ref 26.0–34.0)
MCHC: 31.8 g/dL (ref 30.0–36.0)
MCV: 85.6 fL (ref 80.0–100.0)
Monocytes Absolute: 0.5 10*3/uL (ref 0.1–1.0)
Monocytes Relative: 12 %
NRBC: 0 % (ref 0.0–0.2)
Neutro Abs: 2.2 10*3/uL (ref 1.7–7.7)
Neutrophils Relative %: 50 %
PLATELETS: 179 10*3/uL (ref 150–400)
RBC: 5.7 MIL/uL (ref 4.22–5.81)
RDW: 13.6 % (ref 11.5–15.5)
WBC: 4.4 10*3/uL (ref 4.0–10.5)

## 2018-03-04 LAB — SALICYLATE LEVEL: Salicylate Lvl: <7 mg/dL (ref 2.8–30.0)

## 2018-03-04 LAB — ACETAMINOPHEN LEVEL: Acetaminophen (Tylenol), Serum: <10 ug/mL — ABNORMAL LOW (ref 10–30)

## 2018-03-04 LAB — TROPONIN I: Troponin I: <0.03 ng/mL (ref <0.03)

## 2018-03-04 LAB — LIPASE, BLOOD: Lipase: 25 U/L (ref 11–51)

## 2018-03-04 LAB — ETHANOL: Alcohol, Ethyl (B): <10 mg/dL (ref <10)

## 2018-03-04 LAB — TSH: TSH: 1.459 u[IU]/mL (ref 0.350–4.500)

## 2018-03-04 MED ORDER — DEXTROSE 50 % IV SOLN
1.0000 | Freq: Once | INTRAVENOUS | Status: AC
  Administered 2018-03-04: 50 mL via INTRAVENOUS

## 2018-03-04 MED ORDER — DEXTROSE 50 % IV SOLN
INTRAVENOUS | Status: AC
  Filled 2018-03-04: qty 50

## 2018-03-04 MED ORDER — LORAZEPAM 2 MG/ML IJ SOLN: 1.0000 mg | Freq: Once | INTRAMUSCULAR | Status: DC

## 2018-03-04 NOTE — ED Notes (Signed)
Pt resting lights off. Does not want to eat or drink anything else at this time.

## 2018-03-04 NOTE — ED Notes (Signed)
Patient transported to CT 

## 2018-03-04 NOTE — ED Notes (Signed)
Pt able to sit up, verbalize needs, carry on conversation. Pt given meal tray and apple juice.

## 2018-03-04 NOTE — ED Notes (Signed)
Received report from RN stating discharge paperwork needs to be faxed to [phone].  Group Home # is [phone].

## 2018-03-04 NOTE — ED Provider Notes (Signed)
-----------------------------------------   5:26 PM on 03/04/2018 -----------------------------------------  This patient was moved over to the quad.  I took over care from Dr. Pershing ProudSchaevitz.  The patient was evaluated by TTS.  At this time the patient is alert, calm, and behaving relatively appropriately for his age.  Because of the concern for catatonia and the fact that the patient was placed under commitment, he will require psychiatric evaluation.  I think he will benefit more from in person psychiatric evaluation rather than SOC, so I will keep him in the ED under observation until the morning when he can be evaluated by psychiatry.  We will continue to monitor his mental status and behavior.  ----------------------------------------- 10:44 PM on 03/04/2018 -----------------------------------------  The patient has remained alert with no catatonic type symptoms.  He had a borderline low glucose on repeat, so was encouraged to eat.  The plan will be to observe him overnight in the ED, checking his glucose and monitor and his mental status, then psychiatry evaluation in the morning.  If his glucose and mental status remained stable and he is cleared by psychiatry he may be discharged back to his home.  I am signing the patient out to the oncoming physician Dr. Roxan Hockeyobinson.   Dionne BucySiadecki, Mikah Poss, MD 03/04/18 2245

## 2018-03-04 NOTE — ED Notes (Signed)
Warm blankets placed on pt at this time.

## 2018-03-04 NOTE — ED Notes (Signed)
Pt resting at this time. No c/o pain.

## 2018-03-04 NOTE — ED Notes (Signed)
Patient refused meal tray stating, "I've eaten enough today."

## 2018-03-04 NOTE — BH Assessment (Signed)
Assessment Note  Timothy [name] is an 84 y.o. male. Pt. reports his purpose for coming [facility] ED was because of his hypertension. Pt. did report a history of schizophrenia, but no recent episodes or inpatient treatment. Pt. reports no current SI or HI. Pt. reports no AV/H. Pt. reports he feels much better while being [facility] ED. Patient states. "now that I've had some rest and something to eat I feel much better". EMS reported when patient was picked up last nigh, pt. appeared catatonic and unresponsive. EMS reported pt. Blood sugar to be 55 and started and IV. Pt. was unresponsive majority of today and sleeping. When this writer spoke with pt., the pt. was asleep, but woke up and agreed to speak with this Clinical research associatewriter. Pt. was pleasant, and able to speak clearly. Pt. was oriented to person, place, time, and situation. Pt. [facility] hypertension. Pt. shares he's not taken his medication for his blood pressure. Pt. reports no current treatment for mental health  and no recent inpatient treatment stays. Pt. did report [facility] a [facility] [place] [facility] [address] [place]. Pt. Reports weight lost over past year. Pt. shares "food at group home not cooked thoroughly". Group Home # is [phone]. [facility] guardianship services with Allegra GranaShelia Marshall [phone]) is guardinship service for patient.   Diagnosis: Schizophrenia and Hypertension  Past Medical History:  Past Medical History:  Diagnosis Date  . Hypertension   . Schizophrenia (HCC)   . Seizures (HCC)   . TIA (transient ischemic attack)     History reviewed. No pertinent surgical history.  Family History: History reviewed. No pertinent family history.  Social History:  reports that he has been smoking cigarettes. He has a 6.25 pack-year smoking history. He has never used smokeless tobacco. He reports that he does not drink alcohol or use drugs.  Additional Social History:  Alcohol / Drug Use Pain  Medications: SEE MAR Prescriptions: SEE MAR Over the Counter: SEE MAR  CIWA: CIWA-Ar BP: (!) 162/84 Pulse Rate: 70 COWS:    Allergies: No Known Allergies  Home Medications: (Not in a hospital admission)   OB/GYN Status:  No LMP for male patient.  General Assessment Data Assessment unable to be completed: (NA) [facility] TTS Assessment: In system Is this a Tele or Face-to-Face Assessment?: Face-to-Face Is this an Initial Assessment or a Re-assessment for this encounter?: Initial Assessment Patient Accompanied by:: N/A Language Other than English: No Living Arrangements: In Group Home: (Comment: Name of Group Home) What gender do you identify as?: Male Marital status: Single Maiden name: NA Pregnancy Status: No Living Arrangements: Group Home Can pt return to current living arrangement?: Yes Admission Status: Voluntary Is patient capable of signing voluntary admission?: Yes Referral Source: Self/Family/Friend Insurance type: Adult nurseocial Services  Medical Screening Exam [facility] Walk-in ONLY) Medical Exam completed: Yes  Crisis Care Plan Living Arrangements: Group Home Legal Guardian: Other:([facility]) Name of Psychiatrist: Unable to Attain Name of Therapist: NA  Education Status Is patient currently in school?: No Name of school: NA Contact person: NA Is the patient employed, unemployed or receiving disability?: Receiving disability income  Risk to self with the past 6 months Suicidal Ideation: No Has patient been a risk to self within the past 6 months prior to admission? : No Suicidal Intent: No Has patient had any suicidal intent within the past 6 months prior to admission? : No Is patient at  risk for suicide?: No Suicidal Plan?: No Has patient had any suicidal plan within the past 6 months prior to admission? : No Access to Means: No What has been your use of drugs/alcohol within the last 12 months?: No Previous  Attempts/Gestures: No How many times?: 0 Other Self Harm Risks: None Triggers for Past Attempts: Unknown Intentional Self Injurious Behavior: None Family Suicide History: Unknown Recent stressful life event(s): Other (Comment)(Hypertension) Persecutory voices/beliefs?: No Depression: No Depression Symptoms: Despondent Substance abuse history and/or treatment for substance abuse?: No Suicide prevention information given to non-admitted patients: Not applicable  Risk to Others within the past 6 months Homicidal Ideation: No Does patient have any lifetime risk of violence toward others beyond the six months prior to admission? : No Thoughts of Harm to Others: No Comment - Thoughts of Harm to Others: None Current Homicidal Intent: No Current Homicidal Plan: No Describe Current Homicidal Plan: None Access to Homicidal Means: No Describe Access to Homicidal Means: None Identified Victim: NA History of harm to others?: No Assessment of Violence: None Noted Violent Behavior Description: NA Does patient have access to weapons?: No Criminal Charges Pending?: No Does patient have a court date: No Is patient on probation?: No  Psychosis Hallucinations: None noted Delusions: None noted  Mental Status Report Appearance/Hygiene: In scrubs Eye Contact: Fair Motor Activity: Unable to assess Speech: Soft, Slow Level of Consciousness: Sleeping, Drowsy Mood: Sullen, Pleasant Affect: Flat Anxiety Level: Minimal Thought Processes: Coherent, Relevant Judgement: Unimpaired Orientation: Person, Place, Time, Situation Obsessive Compulsive Thoughts/Behaviors: None  Cognitive Functioning Concentration: Normal Memory: Recent Intact Is patient IDD: No Insight: Fair Impulse Control: Good Appetite: Poor Sleep: No Change Total Hours of Sleep: 5 Vegetative Symptoms: None  ADLScreening [facility] Assessment Services) Patient's cognitive ability adequate to safely complete daily activities?:  Yes Patient able to express need for assistance with ADLs?: Yes Independently performs ADLs?: Yes (appropriate for developmental age)  Prior Inpatient Therapy Prior Inpatient Therapy: No  Prior Outpatient Therapy Prior Outpatient Therapy: No Does patient have an ACCT team?: Unknown Does patient have Intensive In-House Services?  : No Does patient have [facility] services? : No Does patient have P4CC services?: No  ADL Screening (condition at time of admission) Patient's cognitive ability adequate to safely complete daily activities?: Yes Is the patient deaf or have difficulty hearing?: No Does the patient have difficulty seeing, even when wearing glasses/contacts?: No Does the patient have difficulty concentrating, remembering, or making decisions?: No Patient able to express need for assistance with ADLs?: Yes Does the patient have difficulty dressing or bathing?: No Independently performs ADLs?: Yes (appropriate for developmental age) Does the patient have difficulty walking or climbing stairs?: No Weakness of Legs: None Weakness of Arms/Hands: None  Home Assistive Devices/Equipment Home Assistive Devices/Equipment: None  Therapy Consults (therapy consults require a physician order) PT Evaluation Needed: No OT Evalulation Needed: No SLP Evaluation Needed: No Abuse/Neglect Assessment (Assessment to be complete while patient is alone) Abuse/Neglect Assessment Can Be Completed: Unable to assess, patient is non-responsive or altered mental status Physical Abuse: Denies Verbal Abuse: Denies Sexual Abuse: Denies Exploitation of patient/patient's resources: Denies Self-Neglect: Denies Values / Beliefs Cultural Requests During Hospitalization: None Spiritual Requests During Hospitalization: None Consults Spiritual Care Consult Needed: No Social Work Consult Needed: No Merchant navy officerAdvance Directives (For Healthcare) Does Patient Have a Medical Advance Directive?: No        Child/Adolescent Assessment Running Away Risk: Denies Bed-Wetting: Denies Destruction of Property: Denies Cruelty to Animals: Denies Stealing: Denies Rebellious/Defies Authority: Denies  Satanic Involvement: Denies Fire Setting: Denies Problems at School: Denies Gang Involvement: Denies  Disposition:  Disposition Initial Assessment Completed for this Encounter: Yes Patient referred to: Other (Comment)(Group Home)  On Site Evaluation by:   Reviewed with Physician:    Rapheal Masso R, MA, LCAS, CCSI 03/04/2018 4:18 PM

## 2018-03-04 NOTE — ED Notes (Addendum)
Pt CBG 82. Given apple juice at this time.

## 2018-03-04 NOTE — ED Triage Notes (Addendum)
Pt comes [facility] group home via [facility] with AMS and hypoglycemia. CBG 55 with EMS. Hard to arouse. Opens eyes to physical shaking/loud verbal. Hx of seizures, diabetes. Pt given D10 with EMS.

## 2018-03-04 NOTE — ED Provider Notes (Signed)
[facility] Emergency Department Provider Note  ____________________________________________   First MD Initiated Contact with Patient 03/04/18 0827     (approximate)  I have reviewed the triage vital signs and the nursing notes.   HISTORY  Chief Complaint Altered Mental Status and Hypoglycemia   HPI Timothy [name] is a 84 y.o. male history of hypertension, schizophrenia, seizure disorder and TIA who is presenting emergency department unresponsive.  EMS reports the patient has been like this since last night.  However, when the patient did not improve this morning EMS was called.  EMS found his blood sugar to be 55 and started D10 via IV.  Patient not verbally responsive at this time.   Past Medical History:  Diagnosis Date  . Hypertension   . Schizophrenia (HCC)   . Seizures (HCC)   . TIA (transient ischemic attack)     Patient Active Problem List   Diagnosis Date Noted  . Pressure ulcer 08/30/2015  . Atrial fibrillation and flutter (HCC) 08/29/2015  . Cerebral vascular disease 06/09/2015  . B12 deficiency 06/09/2015  . Hypokalemia 06/09/2015  . 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 Seizures (HCC) 11/28/2014  . TIA (transient ischemic attack) 11/28/2014  . Schizophrenia (HCC) 11/28/2014  . Hypertension 11/28/2014  . Dyslipidemia 11/28/2014  . Dementia (HCC) 11/28/2014    History reviewed. No pertinent surgical history.  Prior to Admission medications   Medication Sig Start Date End Date Taking? Authorizing Provider  amLODipine (NORVASC) 10 MG tablet Take 1 tablet (10 mg total) by mouth daily. 02/18/16  Yes Duanne Limerick, MD  atorvastatin (LIPITOR) 10 MG tablet TAKE 1 TABLET BY MOUTH DAILY. Patient taking differently: Take 10 mg by mouth every evening.  08/31/16  Yes Duanne Limerick, MD  lamoTRIgine (LAMICTAL) 150 MG tablet Take 150 mg by mouth 2 (two) times daily.   Yes [provider]  MAPAP ARTHRITIS PAIN 650 MG CR tablet  TAKE 1 TABLET BY MOUTH 2 TIMES DAILY. AS NEEDED Patient taking differently: Take 650 mg by mouth 2 (two) times daily.  04/05/16  Yes Duanne Limerick, MD  memantine (NAMENDA) 5 MG tablet TAKE 1 TABLET BY MOUTH DAILY Patient taking differently: Take 5 mg by mouth daily.  08/31/16  Yes Duanne Limerick, MD  OLANZapine (ZYPREXA) 10 MG tablet Take 20 mg by mouth at bedtime.    Yes [provider]  vitamin B-12 (CYANOCOBALAMIN) 1000 MCG tablet TAKE 1 TABLET BY MOUTH DAILY. Patient taking differently: Take 1,000 mcg by mouth daily.  08/31/16  Yes Duanne Limerick, MD  acetaminophen (TYLENOL) 325 MG tablet Take 2 tablets (650 mg total) by mouth 2 (two) times daily. As needed Patient taking differently: Take 650 mg by mouth 2 (two) times daily as needed. As needed 02/18/16   Duanne Limerick, MD  amiodarone (PACERONE) 200 MG tablet Take 2 tablets (400 mg total) by mouth daily. X 5 days and then decrease to 200mg  (1tab) every day Patient not taking: Reported on 02/18/2016 09/02/15   Enid Baas, MD  cholecalciferol (VITAMIN D) 1000 units tablet Take 1 tablet (1,000 Units total) by mouth daily. 02/18/16   Duanne Limerick, MD  levETIRAcetam (KEPPRA) 100 MG/ML solution Take 15 mLs (1,500 mg total) by mouth 2 (two) times daily. 09/02/15   Enid Baas, MD    Allergies Patient has no known allergies.  History reviewed. No pertinent family history.  Social History Social History   Tobacco Use  . Smoking status: Current Every  Day Smoker    Packs/day: 0.25    Years: 25.00    Pack years: 6.25    Types: Cigarettes  . Smokeless tobacco: Never Used  Substance Use Topics  . Alcohol use: No    Alcohol/week: 0.0 standard drinks  . Drug use: No    Review of Systems Level 5 caveat secondary to altered mentation.  ____________________________________________   PHYSICAL EXAM:  VITAL SIGNS: ED Triage Vitals  Enc Vitals Group     BP 03/04/18 0826 (!) 161/69     Pulse Rate 03/04/18 0826 66      Resp 03/04/18 0826 16     Temp --      Temp src --      SpO2 03/04/18 0826 98 %     Weight 03/04/18 0824 156 lb 8.4 oz (71 kg)     Height 03/04/18 0824 5\' 8"  (1.727 m)     Head Circumference --      Peak Flow --      Pain Score 03/04/18 0824 0     Pain Loc --      Pain Edu? --      Excl. in GC? --     Constitutional: Patient with eyes closed.  Not moving any extremities.  However, when I open his eyes the patient appears look around the room and does not have a fixed gaze.  Pupils are 1 to 2 mm and minimally reactive.  When I raise the patient's right arm above his head he slowly places the arm back at his side.  Also occasionally wiggles his toes.  However, does not respond to pain from a sternal rub. Eyes: Conjunctivae are normal.  Head: Atraumatic. Nose: No congestion/rhinnorhea. Mouth/Throat: Mucous membranes are moist.  Neck: No stridor.   Cardiovascular: Normal rate, regular rhythm. Grossly normal heart sounds.  Respiratory: Normal respiratory effort.  No retractions.  Gastrointestinal: Soft and nontender. No distention.  Musculoskeletal: No lower extremity tenderness nor edema.  No joint effusions. Neurologic: Confounded by patient's clinical state.  However, does not appear to have any posturing or convulsive activity.  Good tone throughout. Skin:  Skin is warm, dry and intact. No rash noted.   ____________________________________________   LABS (all labs ordered are listed, but only abnormal results are displayed)  Labs Reviewed  GLUCOSE, CAPILLARY - Abnormal; Notable for the following components:      Result Value   Glucose-Capillary 59 (*)    All other components within normal limits  COMPREHENSIVE METABOLIC PANEL - Abnormal; Notable for the following components:   Glucose, Bld 65 (*)    Total Bilirubin 1.4 (*)    All other components within normal limits  URINALYSIS, COMPLETE (UACMP) WITH MICROSCOPIC - Abnormal; Notable for the following components:    Color, Urine YELLOW (*)    APPearance CLEAR (*)    Hgb urine dipstick SMALL (*)    Ketones, ur 5 (*)    All other components within normal limits  BLOOD GAS, VENOUS - Abnormal; Notable for the following components:   Bicarbonate 33.7 (*)    Acid-Base Excess 6.8 (*)    All other components within normal limits  ACETAMINOPHEN LEVEL - Abnormal; Notable for the following components:   Acetaminophen (Tylenol), Serum <10 (*)    All other components within normal limits  GLUCOSE, CAPILLARY - Abnormal; Notable for the following components:   Glucose-Capillary 209 (*)    All other components within normal limits  GLUCOSE, CAPILLARY - Abnormal; Notable for the following  components:   Glucose-Capillary 172 (*)    All other components within normal limits  GLUCOSE, CAPILLARY - Abnormal; Notable for the following components:   Glucose-Capillary 136 (*)    All other components within normal limits  GLUCOSE, CAPILLARY - Abnormal; Notable for the following components:   Glucose-Capillary 120 (*)    All other components within normal limits  GLUCOSE, CAPILLARY - Abnormal; Notable for the following components:   Glucose-Capillary 130 (*)    All other components within normal limits  GLUCOSE, CAPILLARY - Abnormal; Notable for the following components:   Glucose-Capillary 114 (*)    All other components within normal limits  URINE CULTURE  CBC WITH DIFFERENTIAL/PLATELET  LIPASE, BLOOD  TROPONIN I  TSH  ETHANOL  SALICYLATE LEVEL  URINE DRUG SCREEN, QUALITATIVE ([facility] ONLY)  GLUCOSE, CAPILLARY  CBG MONITORING, ED  CBG MONITORING, ED  CBG MONITORING, ED  CBG MONITORING, ED   ____________________________________________  EKG  ED ECG REPORT I, Arelia Longestavid M Chinenye Katzenberger, the attending physician, personally viewed and interpreted this ECG.   Date: 03/04/2018  EKG Time: 0827  Rate: 65  Rhythm: atrial fibrillation, rate 65  Axis: Normal  Intervals:left bundle branch block  ST&T Change: No ST  segment elevation or depression.  No abnormal T wave inversion. No significant change from previous ____________________________________________  RADIOLOGY  No acute finding on the patient's CT head as well as x-ray of the chest. ____________________________________________   PROCEDURES  Procedure(s) performed:   .Critical Care Performed by: Myrna BlazerSchaevitz, Devina Bezold Matthew, MD Authorized by: Myrna BlazerSchaevitz, Darris Carachure Matthew, MD   Critical care provider statement:    Critical care time (minutes):  35   Critical care time was exclusive of:  Separately billable procedures and treating other patients   Critical care was necessary to treat or prevent imminent or life-threatening deterioration of the following conditions:  CNS failure or compromise   Critical care was time spent personally by me on the following activities:  Development of treatment plan with patient or surrogate, discussions with consultants, evaluation of patient's response to treatment, examination of patient, obtaining history from patient or surrogate, ordering and performing treatments and interventions, ordering and review of laboratory studies, ordering and review of radiographic studies, pulse oximetry, re-evaluation of patient's condition and review of old charts    Critical Care performed:    ____________________________________________   INITIAL IMPRESSION / ASSESSMENT AND PLAN / ED COURSE  Pertinent labs & imaging results that were available during my care of the patient were reviewed by me and considered in my medical decision making (see chart for details).  Differential diagnosis includes, but is not limited to, alcohol, illicit or prescription medications, or other toxic ingestion; intracranial pathology such as stroke or intracerebral hemorrhage; fever or infectious causes including sepsis; hypoxemia and/or hypercarbia; uremia; trauma; endocrine related disorders such as diabetes, hypoglycemia, and thyroid-related  diseases; hypertensive encephalopathy; etc. As part of my medical decision making, I reviewed the following data within the electronic MEDICAL RECORD NUMBER Notes from prior ED visits  ----------------------------------------- 8:38 AM on 03/04/2018 -----------------------------------------  Patient stated to have a similar presentation on her previous visit from 2017.  There are multiple possible medical reasons for the patient's altered mental status.  However, he also may be catatonic.  We will give him an amp of D50 as his glucose with a D10 running at this time is still 59.  If the patient's medical work-up is negative I believe that a dose of Ativan may be tried.    -----------------------------------------  9:49 AM on 03/04/2018 -----------------------------------------  Patient at this time is awake and alert and says that he "feels great."  He will be given a sandwich tray and we will continue to observe him for several hours taking glucose readings every hour.  Condition seems to have resolved with elevated glucose.  Likely altered mental status secondary to hypoglycemia.  ----------------------------------------- 3:11 PM on 03/04/2018 -----------------------------------------  I reevaluated the patient after he had sustained his blood sugar and he was in a near catatonic state again.  No seizure activity had been observed.  Suspecting psychiatric etiology for the patient's behavior/decreased mentation.  Patient has maintained his blood sugar over the past 4 hours.  We will continue to observe the patient in the quad section of the emergency department.  Will be placed under involuntary commitment and will be evaluated by psychiatry.  ____________________________________________   FINAL CLINICAL IMPRESSION(S) / ED DIAGNOSES  Hypoglycemia.  Psychosis.  NEW MEDICATIONS STARTED DURING THIS VISIT:  New Prescriptions   No medications on file     Note:  This document was prepared  using Dragon voice recognition software and may include unintentional dictation errors.     Myrna BlazerSchaevitz, Nayan Proch Matthew, MD 03/04/18 [phone]

## 2018-03-04 NOTE — ED Notes (Signed)
This nurse spoke to [employer] with Allegra GranaShelia Marshall [phone]) to inform her that pt was here. This nurse will call again when pt being discharged. Legal guardianship added to FYI at this time.

## 2018-03-05 DIAGNOSIS — F203 Undifferentiated schizophrenia: Secondary | ICD-10-CM | POA: Diagnosis not present

## 2018-03-05 DIAGNOSIS — E162 Hypoglycemia, unspecified: Secondary | ICD-10-CM | POA: Diagnosis not present

## 2018-03-05 LAB — URINE CULTURE: Culture: <10000 — AB

## 2018-03-05 LAB — GLUCOSE, CAPILLARY
Glucose-Capillary: 95 mg/dL (ref 70–99)
Glucose-Capillary: 77 mg/dL (ref 70–99)
Glucose-Capillary: 117 mg/dL — ABNORMAL HIGH (ref 70–99)
Glucose-Capillary: 99 mg/dL (ref 70–99)
Glucose-Capillary: 102 mg/dL — ABNORMAL HIGH (ref 70–99)
Glucose-Capillary: 84 mg/dL (ref 70–99)
Glucose-Capillary: 130 mg/dL — ABNORMAL HIGH (ref 70–99)

## 2018-03-05 NOTE — ED Notes (Signed)
BEHAVIORAL HEALTH ROUNDING Patient sleeping: Yes.   Patient alert and oriented: eyes closed  Appears to be asleep Behavior appropriate: Yes.  ; If no, describe:  Nutrition and fluids offered: Yes  Toileting and hygiene offered: sleeping Sitter present: q 15 minute observations and security monitoring Law enforcement present: yes  ODS 

## 2018-03-05 NOTE — ED Notes (Signed)
IVC rescinded/ Waiting on Group Home

## 2018-03-05 NOTE — ED Provider Notes (Signed)
-----------------------------------------   11:38 AM on 03/05/2018 -----------------------------------------   Blood pressure (!) 150/97, pulse 77, temperature 98.1 F (36.7 C), temperature source Oral, resp. rate 19, height 5\' 8"  (1.727 m), weight 71 kg, SpO2 97 %.  The patient had no acute events since last update.  Calm and cooperative at this time.  Evaluated by Dr. Toni Amendlapacs deems the patient appropriate for discharge at this time.  Does not meet admission criteria for psychiatric reasons.  Glucose without any continued hypoglycemic episodes.    Myrna BlazerSchaevitz, Tiari Andringa Matthew, MD 03/05/18 [phone]

## 2018-03-05 NOTE — ED Notes (Signed)
Spoke with Timothy [name] at the group home  - he plans to be here within an hour  Continue to monitor

## 2018-03-05 NOTE — ED Provider Notes (Signed)
-----------------------------------------   6:44 AM on 03/05/2018 -----------------------------------------   Blood pressure (!) 150/97, pulse 77, temperature 98.1 F (36.7 C), temperature source Oral, resp. rate 19, height 5\' 8"  (1.727 m), weight 71 kg, SpO2 97 %.  The patient had no acute events since last update.  Calm and cooperative at this time.  Disposition is pending Psychiatry/Behavioral Medicine team recommendations.     Willy Eddyobinson, Raidyn Wassink, MD 03/05/18 [phone]

## 2018-03-05 NOTE — ED Notes (Signed)
ED Is the patient under IVC or is there intent for IVC: Yes.   Is the patient medically cleared: Yes.   Is there vacancy in the ED BHU: Yes.   Is the population mix appropriate for patient:   No geriatric     Is the patient awaiting placement in inpatient or outpatient setting: Yes.   Has the patient had a psychiatric consult:   Consult pending  Survey of unit performed for contraband, proper placement and condition of furniture, tampering with fixtures in bathroom, shower, and each patient room: Yes.  ; Findings:  APPEARANCE/BEHAVIOR Calm and cooperative NEURO ASSESSMENT Orientation: oriented x3  Denies pain Hallucinations: No.None noted (Hallucinations)  Denies  Speech: Normal Gait: normal  Unsteady at times  - stand by assistance provided   RESPIRATORY ASSESSMENT Even  Unlabored respirations  CARDIOVASCULAR ASSESSMENT Pulses equal   regular rate  Skin warm and dry   GASTROINTESTINAL ASSESSMENT no GI complaint EXTREMITIES Full ROM  PLAN OF CARE Provide calm/safe environment. Vital signs assessed twice daily. ED BHU Assessment once each 12-hour shift. Collaborate with TTS daily or as condition indicates. Assure the ED provider has rounded once each shift. Provide and encourage hygiene. Provide redirection as needed. Assess for escalating behavior; address immediately and inform ED provider.  Assess family dynamic and appropriateness for visitation as needed: Yes.  ; If necessary, describe findings:  Educate the patient/family about BHU procedures/visitation: Yes.  ; If necessary, describe findings:

## 2018-03-05 NOTE — ED Notes (Signed)
Breakfast provided  -  Pt depend changed and area cleansed  Repositioned him and assisted him with eating   Continue to monitor

## 2018-03-05 NOTE — ED Notes (Signed)
BEHAVIORAL HEALTH ROUNDING Patient sleeping: No. Patient alert and oriented: yes Behavior appropriate: Yes.  ; If no, describe:  Nutrition and fluids offered: yes Toileting and hygiene offered: Yes  Sitter present: q15 minute observations and security  monitoring Law enforcement present: Yes  ODS  

## 2018-03-05 NOTE — ED Notes (Signed)

## 2018-03-05 NOTE — Consult Note (Signed)
[facility] Face-to-Face Psychiatry Consult   Reason for Consult: Consult for this 84 year old man with a history of schizophrenia who came to the emergency room apparently initially with complaints about his blood pressure Referring Physician: Pershing Proud Patient Identification: Tharun Cappella MRN:  [id number] Principal Diagnosis: Schizophrenia (HCC) Diagnosis:  Principal Problem:   Schizophrenia (HCC)   Total Time spent with patient: 1 hour  Subjective:   Jian Hodgman is a 84 y.o. male patient admitted with "I was feeling weak".  HPI: Patient seen chart reviewed 84 year old man with multiple medical problems as well as identified dementia and a history of schizophrenia.  Came into the hospital with vague complaints about weakness.  He was noted by medical staff to be exhibiting intermittent catatonia with some odd posturing and behavior and periods of unresponsiveness.  On interview today I found the patient awake and appropriately interactive.  Patient understood he was in the hospital.  Could not remember where he was living previously.  Thought he still lived in [place].  Patient says he thinks he came here because of his blood pressure but is not sure.  He says his mood now is feeling emotionally fine.  Denies any sense of depression or hopelessness.  Denies any suicidal or homicidal thoughts.  Denies having any hallucinations.  Does not make any delusional statements.  Denies any concerns about his medicine.  Social history: I believe he lives at [facility].  He has a legal guardian in [place].  Substance abuse history: Nothing relevant  Medical history: History of hypertension history of seizures past history of stroke  Past Psychiatric History: Long history of schizophrenia currently on Zyprexa at night.  Does have a history of some catatonia in the past.  Unknown if he has ever had suicide attempts.  He denies it.  Risk to Self: Suicidal Ideation: No Suicidal Intent:  No Is patient at risk for suicide?: No Suicidal Plan?: No Access to Means: No What has been your use of drugs/alcohol within the last 12 months?: No How many times?: 0 Other Self Harm Risks: None Triggers for Past Attempts: Unknown Intentional Self Injurious Behavior: None Risk to Others: Homicidal Ideation: No Thoughts of Harm to Others: No Comment - Thoughts of Harm to Others: None Current Homicidal Intent: No Current Homicidal Plan: No Describe Current Homicidal Plan: None Access to Homicidal Means: No Describe Access to Homicidal Means: None Identified Victim: NA History of harm to others?: No Assessment of Violence: None Noted Violent Behavior Description: NA Does patient have access to weapons?: No Criminal Charges Pending?: No Does patient have a court date: No Prior Inpatient Therapy: Prior Inpatient Therapy: No Prior Outpatient Therapy: Prior Outpatient Therapy: No Does patient have an ACCT team?: Unknown Does patient have Intensive In-House Services?  : No Does patient have [facility] services? : No Does patient have P4CC services?: No  Past Medical History:  Past Medical History:  Diagnosis Date  . Hypertension   . Schizophrenia (HCC)   . Seizures (HCC)   . TIA (transient ischemic attack)    History reviewed. No pertinent surgical history. Family History: History reviewed. No pertinent family history. Family Psychiatric  History: None Social History:  Social History   Substance and Sexual Activity  Alcohol Use No  . Alcohol/week: 0.0 standard drinks     Social History   Substance and Sexual Activity  Drug Use No    Social History   Socioeconomic History  . Marital status: Widowed    Spouse name: Not on file  .  Number of children: Not on file  . Years of education: Not on file  . Highest education level: Not on file  Occupational History  . Not on file  Social Needs  . Financial resource strain: Not on file  . Food insecurity:    Worry: Not on  file    Inability: Not on file  . Transportation needs:    Medical: Not on file    Non-medical: Not on file  Tobacco Use  . Smoking status: Current Every Day Smoker    Packs/day: 0.25    Years: 25.00    Pack years: 6.25    Types: Cigarettes  . Smokeless tobacco: Never Used  Substance and Sexual Activity  . Alcohol use: No    Alcohol/week: 0.0 standard drinks  . Drug use: No  . Sexual activity: Not Currently  Lifestyle  . Physical activity:    Days per week: Not on file    Minutes per session: Not on file  . Stress: Not on file  Relationships  . Social connections:    Talks on phone: Not on file    Gets together: Not on file    Attends religious service: Not on file    Active member of club or organization: Not on file    Attends meetings of clubs or organizations: Not on file    Relationship status: Not on file  Other Topics Concern  . Not on file  Social History Narrative  . Not on file   Additional Social History:    Allergies:  No Known Allergies  Labs:  Results for orders placed or performed during the hospital encounter of 03/04/18 (from the past 48 hour(s))  Glucose, capillary     Status: Abnormal   Collection Time: 03/04/18  8:29 AM  Result Value Ref Range   Glucose-Capillary 59 (L) 70 - 99 mg/dL  Blood gas, venous     Status: Abnormal   Collection Time: 03/04/18  8:31 AM  Result Value Ref Range   pH, Ven 7.38 7.250 - 7.430   pCO2, Ven 57 44.0 - 60.0 mmHg   pO2, Ven 35.0 32.0 - 45.0 mmHg   Bicarbonate 33.7 (H) 20.0 - 28.0 mmol/L   Acid-Base Excess 6.8 (H) 0.0 - 2.0 mmol/L   O2 Saturation 65.9 %   Patient temperature 37.0    Collection site VEIN    Sample type VEIN     Comment: Performed at [facility], [address], [place], [place] [address]  Urine Drug Screen, Qualitative ([facility] only)     Status: None   Collection Time: 03/04/18  8:32 AM  Result Value Ref Range   Tricyclic, Ur Screen NONE DETECTED NONE DETECTED   Amphetamines, Ur  Screen NONE DETECTED NONE DETECTED   MDMA (Ecstasy)Ur Screen NONE DETECTED NONE DETECTED   Cocaine Metabolite,Ur [place] NONE DETECTED NONE DETECTED   Opiate, Ur Screen NONE DETECTED NONE DETECTED   Phencyclidine (PCP) Ur S NONE DETECTED NONE DETECTED   Cannabinoid 50 Ng, Ur  NONE DETECTED NONE DETECTED   Barbiturates, Ur Screen NONE DETECTED NONE DETECTED   Benzodiazepine, Ur Scrn NONE DETECTED NONE DETECTED   Methadone Scn, Ur NONE DETECTED NONE DETECTED    Comment: (NOTE) Tricyclics + metabolites, urine    Cutoff 1000 ng/mL Amphetamines + metabolites, urine  Cutoff 1000 ng/mL MDMA (Ecstasy), urine              Cutoff 500 ng/mL Cocaine Metabolite, urine  Cutoff 300 ng/mL Opiate + metabolites, urine        Cutoff 300 ng/mL Phencyclidine (PCP), urine         Cutoff 25 ng/mL Cannabinoid, urine                 Cutoff 50 ng/mL Barbiturates + metabolites, urine  Cutoff 200 ng/mL Benzodiazepine, urine              Cutoff 200 ng/mL Methadone, urine                   Cutoff 300 ng/mL The urine drug screen provides only a preliminary, unconfirmed analytical test result and should not be used for non-medical purposes. Clinical consideration and professional judgment should be applied to any positive drug screen result due to possible interfering substances. A more specific alternate chemical method must be used in order to obtain a confirmed analytical result. Gas chromatography / mass spectrometry (GC/MS) is the preferred confirmat ory method. Performed at [facility], [address] [place], [place] [address]   CBC with Differential     Status: None   Collection Time: 03/04/18  8:33 AM  Result Value Ref Range   WBC 4.4 4.0 - 10.5 K/uL   RBC 5.70 4.22 - 5.81 MIL/uL   Hemoglobin 15.5 13.0 - 17.0 g/dL   HCT 60.4 54.0 - 98.1 %   MCV 85.6 80.0 - 100.0 fL   MCH 27.2 26.0 - 34.0 pg   MCHC 31.8 30.0 - 36.0 g/dL   RDW 19.1 47.8 - 29.5 %   Platelets 179 150 - 400 K/uL    nRBC 0.0 0.0 - 0.2 %   Neutrophils Relative % 50 %   Neutro Abs 2.2 1.7 - 7.7 K/uL   Lymphocytes Relative 35 %   Lymphs Abs 1.5 0.7 - 4.0 K/uL   Monocytes Relative 12 %   Monocytes Absolute 0.5 0.1 - 1.0 K/uL   Eosinophils Relative 2 %   Eosinophils Absolute 0.1 0.0 - 0.5 K/uL   Basophils Relative 1 %   Basophils Absolute 0.0 0.0 - 0.1 K/uL   Immature Granulocytes 0 %   Abs Immature Granulocytes 0.01 0.00 - 0.07 K/uL    Comment: Performed at [facility], [address] [place], [place] [address]  Comprehensive metabolic panel     Status: Abnormal   Collection Time: 03/04/18  8:33 AM  Result Value Ref Range   Sodium 140 135 - 145 mmol/L   Potassium 3.7 3.5 - 5.1 mmol/L   Chloride 102 98 - 111 mmol/L   CO2 29 22 - 32 mmol/L   Glucose, Bld 65 (L) 70 - 99 mg/dL   BUN 11 8 - 23 mg/dL   Creatinine, Ser 8.65 0.61 - 1.24 mg/dL   Calcium 9.5 8.9 - 78.4 mg/dL   Total Protein 7.9 6.5 - 8.1 g/dL   Albumin 4.3 3.5 - 5.0 g/dL   AST 29 15 - 41 U/L   ALT 21 0 - 44 U/L   Alkaline Phosphatase 69 38 - 126 U/L   Total Bilirubin 1.4 (H) 0.3 - 1.2 mg/dL   GFR calc non Af Amer >60 >60 mL/min   GFR calc Af Amer >60 >60 mL/min   Anion gap 9 5 - 15    Comment: Performed at [facility], [address] [place], [place] [address]  Lipase, blood     Status: None   Collection Time: 03/04/18  8:33 AM  Result Value Ref  Range   Lipase 25 11 - 51 U/L    Comment: Performed at [facility], [address] [place], [place] [address]  Troponin I - ONCE - STAT     Status: None   Collection Time: 03/04/18  8:33 AM  Result Value Ref Range   Troponin I <0.03 <0.03 ng/mL    Comment: Performed at [facility], [address] [place], [place] [address]  Urinalysis, Complete w Microscopic     Status: Abnormal   Collection Time: 03/04/18  8:33 AM  Result Value Ref Range   Color, Urine YELLOW (A) YELLOW   APPearance CLEAR (A) CLEAR   Specific Gravity, Urine 1.013 1.005  - 1.030   pH 7.0 5.0 - 8.0   Glucose, UA NEGATIVE NEGATIVE mg/dL   Hgb urine dipstick SMALL (A) NEGATIVE   Bilirubin Urine NEGATIVE NEGATIVE   Ketones, ur 5 (A) NEGATIVE mg/dL   Protein, ur NEGATIVE NEGATIVE mg/dL   Nitrite NEGATIVE NEGATIVE   Leukocytes, UA NEGATIVE NEGATIVE   RBC / HPF 6-10 0 - 5 RBC/hpf   WBC, UA 0-5 0 - 5 WBC/hpf   Bacteria, UA NONE SEEN NONE SEEN   Squamous Epithelial / LPF 0-5 0 - 5   Mucus PRESENT     Comment: Performed at [facility], [address] [place], [place] [address]  TSH     Status: None   Collection Time: 03/04/18  8:33 AM  Result Value Ref Range   TSH 1.459 0.350 - 4.500 uIU/mL    Comment: Performed by a 3rd Generation assay with a functional sensitivity of <=0.01 uIU/mL. Performed at [facility], [address] [place], [place] [address]   Ethanol     Status: None   Collection Time: 03/04/18  8:33 AM  Result Value Ref Range   Alcohol, Ethyl (B) <10 <10 mg/dL    Comment: (NOTE) Lowest detectable limit for serum alcohol is 10 mg/dL. For medical purposes only. Performed at [facility], [address] [place], [place] [address]   Salicylate level     Status: None   Collection Time: 03/04/18  8:33 AM  Result Value Ref Range   Salicylate Lvl <7.0 2.8 - 30.0 mg/dL    Comment: Performed at [facility], [address] [place], [place] [address]  Acetaminophen level     Status: Abnormal   Collection Time: 03/04/18  8:33 AM  Result Value Ref Range   Acetaminophen (Tylenol), Serum <10 (L) 10 - 30 ug/mL    Comment: (NOTE) Therapeutic concentrations vary significantly. A range of 10-30 ug/mL  may be an effective concentration for many patients. However, some  are best treated at concentrations outside of this range. Acetaminophen concentrations >150 ug/mL at 4 hours after ingestion  and >50 ug/mL at 12 hours after ingestion are often associated with  toxic reactions. Performed at [facility], [address] [place], [place] [address]   Glucose, capillary     Status: Abnormal   Collection Time: 03/04/18  8:48 AM  Result Value Ref Range   Glucose-Capillary 209 (H) 70 - 99 mg/dL  Glucose, capillary     Status: Abnormal   Collection Time: 03/04/18  9:17 AM  Result Value Ref Range   Glucose-Capillary 172 (H) 70 - 99 mg/dL  Glucose, capillary     Status: Abnormal   Collection Time: 03/04/18 10:10 AM  Result Value Ref Range   Glucose-Capillary 136 (H) 70 - 99 mg/dL  Glucose,  capillary     Status: Abnormal   Collection Time: 03/04/18 11:19 AM  Result Value Ref Range   Glucose-Capillary 120 (H) 70 - 99 mg/dL  Glucose, capillary     Status: Abnormal   Collection Time: 03/04/18 12:07 PM  Result Value Ref Range   Glucose-Capillary 130 (H) 70 - 99 mg/dL  Glucose, capillary     Status: Abnormal   Collection Time: 03/04/18  1:12 PM  Result Value Ref Range   Glucose-Capillary 114 (H) 70 - 99 mg/dL  Glucose, capillary     Status: None   Collection Time: 03/04/18  2:13 PM  Result Value Ref Range   Glucose-Capillary 82 70 - 99 mg/dL  Glucose, capillary     Status: None   Collection Time: 03/04/18  7:45 PM  Result Value Ref Range   Glucose-Capillary 73 70 - 99 mg/dL  Glucose, capillary     Status: Abnormal   Collection Time: 03/04/18  9:45 PM  Result Value Ref Range   Glucose-Capillary 206 (H) 70 - 99 mg/dL  Glucose, capillary     Status: Abnormal   Collection Time: 03/04/18 11:43 PM  Result Value Ref Range   Glucose-Capillary 125 (H) 70 - 99 mg/dL  Glucose, capillary     Status: None   Collection Time: 03/05/18  2:50 AM  Result Value Ref Range   Glucose-Capillary 95 70 - 99 mg/dL   Comment 1 Notify RN    Comment 2 Document in Chart   Glucose, capillary     Status: None   Collection Time: 03/05/18  4:32 AM  Result Value Ref Range   Glucose-Capillary 77 70 - 99 mg/dL  Glucose, capillary     Status: Abnormal   Collection Time: 03/05/18  6:31 AM  Result Value Ref  Range   Glucose-Capillary 117 (H) 70 - 99 mg/dL  Glucose, capillary     Status: None   Collection Time: 03/05/18  7:53 AM  Result Value Ref Range   Glucose-Capillary 99 70 - 99 mg/dL  Glucose, capillary     Status: Abnormal   Collection Time: 03/05/18  9:53 AM  Result Value Ref Range   Glucose-Capillary 102 (H) 70 - 99 mg/dL    No current facility-administered medications for this encounter.    Current Outpatient Medications  Medication Sig Dispense Refill  . amLODipine (NORVASC) 10 MG tablet Take 1 tablet (10 mg total) by mouth daily. 30 tablet 5  . atorvastatin (LIPITOR) 10 MG tablet TAKE 1 TABLET BY MOUTH DAILY. (Patient taking differently: Take 10 mg by mouth every evening. ) 30 tablet 0  . lamoTRIgine (LAMICTAL) 150 MG tablet Take 150 mg by mouth 2 (two) times daily.    Marland Kitchen MAPAP ARTHRITIS PAIN 650 MG CR tablet TAKE 1 TABLET BY MOUTH 2 TIMES DAILY. AS NEEDED (Patient taking differently: Take 650 mg by mouth 2 (two) times daily. ) 60 tablet 0  . memantine (NAMENDA) 5 MG tablet TAKE 1 TABLET BY MOUTH DAILY (Patient taking differently: Take 5 mg by mouth daily. ) 30 tablet 0  . OLANZapine (ZYPREXA) 10 MG tablet Take 20 mg by mouth at bedtime.     . vitamin B-12 (CYANOCOBALAMIN) 1000 MCG tablet TAKE 1 TABLET BY MOUTH DAILY. (Patient taking differently: Take 1,000 mcg by mouth daily. ) 30 tablet 0  . acetaminophen (TYLENOL) 325 MG tablet Take 2 tablets (650 mg total) by mouth 2 (two) times daily. As needed (Patient taking differently: Take 650 mg by mouth 2 (two) times  daily as needed. As needed) 60 tablet 5  . amiodarone (PACERONE) 200 MG tablet Take 2 tablets (400 mg total) by mouth daily. X 5 days and then decrease to 200mg  (1tab) every day (Patient not taking: Reported on 02/18/2016) 40 tablet 2  . cholecalciferol (VITAMIN D) 1000 units tablet Take 1 tablet (1,000 Units total) by mouth daily. 30 tablet 5  . levETIRAcetam (KEPPRA) 100 MG/ML solution Take 15 mLs (1,500 mg total) by mouth 2  (two) times daily. 473 mL 12    Musculoskeletal: Strength & Muscle Tone: decreased Gait & Station: unsteady Patient leans: N/A  Psychiatric Specialty Exam: Physical Exam  Nursing note and vitals reviewed. Cardiovascular: Regular rhythm.  Respiratory: No respiratory distress.  Psychiatric: Judgment normal. His affect is blunt. His speech is delayed. He is slowed. Thought content is not delusional. Cognition and memory are impaired. He expresses no homicidal and no suicidal ideation. He exhibits abnormal recent memory and abnormal remote memory.    Review of Systems  Constitutional: Negative.   HENT: Negative.   Eyes: Negative.   Respiratory: Negative.   Cardiovascular: Negative.   Gastrointestinal: Negative.   Musculoskeletal: Negative.   Skin: Negative.   Neurological: Positive for focal weakness.  Psychiatric/Behavioral: Positive for memory loss. Negative for depression, hallucinations, substance abuse and suicidal ideas. The patient is not nervous/anxious and does not have insomnia.     Blood pressure (!) 150/97, pulse 77, temperature 98.1 F (36.7 C), temperature source Oral, resp. rate 19, height 5\' 8"  (1.727 m), weight 71 kg, SpO2 97 %.Body mass index is 23.8 kg/m.  General Appearance: Casual  Eye Contact:  Fair  Speech:  Slow  Volume:  Decreased  Mood:  Euthymic  Affect:  Congruent  Thought Process:  Goal Directed  Orientation:  Negative  Thought Content:  Rumination  Suicidal Thoughts:  No  Homicidal Thoughts:  No  Memory:  Immediate;   Fair Recent;   Poor Remote;   Fair  Judgement:  Impaired  Insight:  Shallow  Psychomotor Activity:  Decreased  Concentration:  Concentration: Poor  Recall:  Poor  Fund of Knowledge:  Fair  Language:  Fair  Akathisia:  No  Handed:  Right  AIMS (if indicated):     Assets:  Communication Skills Desire for Improvement Financial Resources/Insurance Housing  ADL's:  Impaired  Cognition:  Impaired,  Mild and Moderate  Sleep:         Treatment Plan Summary: Plan 84 year old man who currently is asymptomatic.  No evidence that he is meeting commitment criteria.  He is denying any hallucinations.  Not acting bizarrely today.  Denies suicidal or homicidal thoughts.  Patient is being prescribed Zyprexa as an outpatient along with his other medicines.  He has a safe place to go back to.  Patient would prefer to go home and has no desire to remain in the hospital.  Case reviewed with emergency room doctor.  Encouraged patient to continue being compliant with medicine.  Discontinue IVC.  Case reviewed with TTS.  Disposition: No evidence of imminent risk to self or others at present.   Patient does not meet criteria for psychiatric inpatient admission. Supportive therapy provided about ongoing stressors.  Mordecai RasmussenJohn Chetan Mehring, MD 03/05/2018 12:19 PM

## 2018-03-05 NOTE — ED Notes (Signed)
Pt observed lying in bed  He is awake  I introduced myself to him  Plan of care discussed including pending psychiatric consult     Pt visualized with NAD  No verbalized needs or concerns at this time  Continue to monitor

## 2018-08-21 ENCOUNTER — Emergency Department: Payer: Medicare Other

## 2018-08-21 ENCOUNTER — Inpatient Hospital Stay
Admission: EM | Admit: 2018-08-21 | Discharge: 2018-09-03 | DRG: 885 | Disposition: A | Discharge status: Skilled Nursing Facility | Payer: Medicare Other | Attending: Family Medicine | Admitting: Family Medicine

## 2018-08-21 ENCOUNTER — Other Ambulatory Visit: Payer: Self-pay

## 2018-08-21 DIAGNOSIS — F203 Undifferentiated schizophrenia: Secondary | ICD-10-CM | POA: Diagnosis not present

## 2018-08-21 DIAGNOSIS — Z136 Encounter for screening for cardiovascular disorders: Secondary | ICD-10-CM

## 2018-08-21 DIAGNOSIS — J189 Pneumonia, unspecified organism: Secondary | ICD-10-CM | POA: Diagnosis present

## 2018-08-21 DIAGNOSIS — J690 Pneumonitis due to inhalation of food and vomit: Secondary | ICD-10-CM | POA: Diagnosis present

## 2018-08-21 DIAGNOSIS — F201 Disorganized schizophrenia: Secondary | ICD-10-CM | POA: Diagnosis not present

## 2018-08-21 DIAGNOSIS — I10 Essential (primary) hypertension: Secondary | ICD-10-CM | POA: Diagnosis present

## 2018-08-21 DIAGNOSIS — F17210 Nicotine dependence, cigarettes, uncomplicated: Secondary | ICD-10-CM | POA: Diagnosis present

## 2018-08-21 DIAGNOSIS — F0391 Unspecified dementia with behavioral disturbance: Secondary | ICD-10-CM | POA: Diagnosis present

## 2018-08-21 DIAGNOSIS — Z79899 Other long term (current) drug therapy: Secondary | ICD-10-CM

## 2018-08-21 DIAGNOSIS — R4689 Other symptoms and signs involving appearance and behavior: Secondary | ICD-10-CM

## 2018-08-21 DIAGNOSIS — Z716 Tobacco abuse counseling: Secondary | ICD-10-CM

## 2018-08-21 DIAGNOSIS — Z8673 Personal history of transient ischemic attack (TIA), and cerebral infarction without residual deficits: Secondary | ICD-10-CM

## 2018-08-21 DIAGNOSIS — M6520 Calcific tendinitis, unspecified site: Secondary | ICD-10-CM | POA: Diagnosis present

## 2018-08-21 DIAGNOSIS — Z1389 Encounter for screening for other disorder: Secondary | ICD-10-CM

## 2018-08-21 DIAGNOSIS — Z20828 Contact with and (suspected) exposure to other viral communicable diseases: Secondary | ICD-10-CM | POA: Diagnosis present

## 2018-08-21 DIAGNOSIS — F919 Conduct disorder, unspecified: Secondary | ICD-10-CM | POA: Diagnosis present

## 2018-08-21 DIAGNOSIS — Z1383 Encounter for screening for respiratory disorder NEC: Secondary | ICD-10-CM

## 2018-08-21 DIAGNOSIS — N3000 Acute cystitis without hematuria: Secondary | ICD-10-CM

## 2018-08-21 DIAGNOSIS — R4182 Altered mental status, unspecified: Secondary | ICD-10-CM | POA: Diagnosis not present

## 2018-08-21 DIAGNOSIS — G40909 Epilepsy, unspecified, not intractable, without status epilepticus: Secondary | ICD-10-CM | POA: Diagnosis present

## 2018-08-21 DIAGNOSIS — F03918 Unspecified dementia, unspecified severity, with other behavioral disturbance: Secondary | ICD-10-CM | POA: Diagnosis present

## 2018-08-21 DIAGNOSIS — M25511 Pain in right shoulder: Secondary | ICD-10-CM

## 2018-08-21 DIAGNOSIS — F4310 Post-traumatic stress disorder, unspecified: Secondary | ICD-10-CM | POA: Diagnosis present

## 2018-08-21 LAB — CBC WITH DIFFERENTIAL/PLATELET
Abs Immature Granulocytes: 0.02 10*3/uL (ref 0.00–0.07)
Basophils Absolute: 0 10*3/uL (ref 0.0–0.1)
Basophils Relative: 0 %
Eosinophils Absolute: 0 10*3/uL (ref 0.0–0.5)
Eosinophils Relative: 0 %
HCT: 47.7 % (ref 39.0–52.0)
Hemoglobin: 15.7 g/dL (ref 13.0–17.0)
Immature Granulocytes: 0 %
Lymphocytes Relative: 17 %
Lymphs Abs: 0.7 10*3/uL (ref 0.7–4.0)
MCH: 27.3 pg (ref 26.0–34.0)
MCHC: 32.9 g/dL (ref 30.0–36.0)
MCV: 83 fL (ref 80.0–100.0)
Monocytes Absolute: 0.4 10*3/uL (ref 0.1–1.0)
Monocytes Relative: 9 %
Neutro Abs: 3.3 10*3/uL (ref 1.7–7.7)
Neutrophils Relative %: 74 %
Platelets: 159 10*3/uL (ref 150–400)
RBC: 5.75 MIL/uL (ref 4.22–5.81)
RDW: 13.9 % (ref 11.5–15.5)
WBC: 4.5 10*3/uL (ref 4.0–10.5)
nRBC: 0 % (ref 0.0–0.2)

## 2018-08-21 LAB — COMPREHENSIVE METABOLIC PANEL
ALT: 24 U/L (ref 0–44)
AST: 29 U/L (ref 15–41)
Albumin: 4.3 g/dL (ref 3.5–5.0)
Alkaline Phosphatase: 69 U/L (ref 38–126)
Anion gap: 12 (ref 5–15)
BUN: 20 mg/dL (ref 8–23)
CO2: 26 mmol/L (ref 22–32)
Calcium: 9.8 mg/dL (ref 8.9–10.3)
Chloride: 104 mmol/L (ref 98–111)
Creatinine, Ser: 1.02 mg/dL (ref 0.61–1.24)
GFR calc Af Amer: >60 mL/min (ref >60)
GFR calc non Af Amer: >60 mL/min (ref >60)
Glucose, Bld: 90 mg/dL (ref 70–99)
Potassium: 3.3 mmol/L — ABNORMAL LOW (ref 3.5–5.1)
Sodium: 142 mmol/L (ref 135–145)
Total Bilirubin: 1.1 mg/dL (ref 0.3–1.2)
Total Protein: 8 g/dL (ref 6.5–8.1)

## 2018-08-21 LAB — ETHANOL: Alcohol, Ethyl (B): <10 mg/dL (ref <10)

## 2018-08-21 MED ORDER — OLANZAPINE 5 MG PO TBDP
10.0000 mg | ORAL_TABLET | ORAL | Status: AC
  Administered 2018-08-21: 10 mg via ORAL
  Filled 2018-08-21: qty 2

## 2018-08-21 NOTE — ED Notes (Signed)
burgundy scrub pants placed on pt

## 2018-08-21 NOTE — ED Notes (Signed)
Arrives from group home after being combative at the home.  Patient arrives moving all extremities.  Speaking clearly.  Not following commands or responding appropriately.

## 2018-08-21 NOTE — ED Notes (Signed)
Pt otf for imaging, officer accompanied xray tech and pt

## 2018-08-21 NOTE — ED Triage Notes (Signed)
Pt arrives via [facility] from a group home for "tearing up his room" an d being combative. Per EMS pt has a hx of PTSD and will have flashbacks that precipitate these events. Pt arrives with no pants on and only a shirt and socks. Pt flailing arms and chanting phrases over and over during triage.

## 2018-08-21 NOTE — ED Notes (Signed)
PT  PLACED UNDER  IVC PAPERS  INFORMED  RN  JESSICA AND  ODS  OFFICER

## 2018-08-21 NOTE — ED Notes (Signed)
Report given to Gracie, RN 

## 2018-08-21 NOTE — ED Notes (Signed)
Return from CT

## 2018-08-21 NOTE — ED Notes (Signed)
Kennyth Lose psych NP: [phone]

## 2018-08-21 NOTE — ED Provider Notes (Signed)
[facility] Emergency Department Provider Note   ____________________________________________   First MD Initiated Contact with Patient 08/21/18 1726     (approximate)  I have reviewed the triage vital signs and the nursing notes.   HISTORY  Chief Complaint Altered Mental Status  EM caveat: Patient agitated, combative, shouting incoherently  HPI Timothy [name] is a 85 y.o. male history of schizophrenia, seizures, TIAs, A. fib, stroke, and behavioral disturbance  EMS reports the patient was at his group home, he became agitated had "tore up" his room and was acting very agitated and aggressive prompting police and EMS call.  Group home reported concerned that this was due to his mental illness and flashbacks from [place] with previous history of similar in the past.   Past Medical History:  Diagnosis Date  . Hypertension   . Schizophrenia (HCC)   . Seizures (HCC)   . TIA (transient ischemic attack)     Patient Active Problem List   Diagnosis Date Noted  . Pressure ulcer 08/30/2015  . Atrial fibrillation and flutter (HCC) 08/29/2015  . Cerebral vascular disease 06/09/2015  . B12 deficiency 06/09/2015  . Hypokalemia 06/09/2015  . 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 Seizures (HCC) 11/28/2014  . TIA (transient ischemic attack) 11/28/2014  . Schizophrenia (HCC) 11/28/2014  . Hypertension 11/28/2014  . Dyslipidemia 11/28/2014  . Dementia (HCC) 11/28/2014    History reviewed. No pertinent surgical history.  Prior to Admission medications   Medication Sig Start Date End Date Taking? Authorizing Provider  acetaminophen (TYLENOL) 325 MG tablet Take 2 tablets (650 mg total) by mouth 2 (two) times daily. As needed Patient taking differently: Take 650 mg by mouth 2 (two) times daily as needed. As needed 02/18/16  Yes Duanne LimerickJones, Deanna C, MD  amLODipine (NORVASC) 10 MG tablet Take 1 tablet (10 mg total) by mouth daily. 02/18/16  Yes Duanne LimerickJones, Deanna C, MD   atorvastatin (LIPITOR) 10 MG tablet TAKE 1 TABLET BY MOUTH DAILY. Patient taking differently: Take 10 mg by mouth every evening.  08/31/16  Yes Duanne LimerickJones, Deanna C, MD  lamoTRIgine (LAMICTAL) 150 MG tablet Take 150 mg by mouth 2 (two) times daily.   Yes [provider]  MAPAP ARTHRITIS PAIN 650 MG CR tablet TAKE 1 TABLET BY MOUTH 2 TIMES DAILY. AS NEEDED Patient taking differently: Take 650 mg by mouth 2 (two) times daily.  04/05/16  Yes Duanne LimerickJones, Deanna C, MD  memantine (NAMENDA) 5 MG tablet TAKE 1 TABLET BY MOUTH DAILY Patient taking differently: Take 5 mg by mouth daily.  08/31/16  Yes Duanne LimerickJones, Deanna C, MD  OLANZapine (ZYPREXA) 10 MG tablet Take 20 mg by mouth at bedtime.    Yes [provider]  vitamin B-12 (CYANOCOBALAMIN) 1000 MCG tablet TAKE 1 TABLET BY MOUTH DAILY. Patient taking differently: Take 1,000 mcg by mouth daily.  08/31/16  Yes Duanne LimerickJones, Deanna C, MD    Allergies Patient has no known allergies.  History reviewed. No pertinent family history.  Social History Social History   Tobacco Use  . Smoking status: Current Every Day Smoker    Packs/day: 0.25    Years: 25.00    Pack years: 6.25    Types: Cigarettes  . Smokeless tobacco: Never Used  Substance Use Topics  . Alcohol use: No    Alcohol/week: 0.0 standard drinks  . Drug use: No    Review of Systems EM caveat  ____________________________________________   PHYSICAL EXAM:  VITAL SIGNS: ED Triage Vitals  Enc Vitals Group  BP 08/21/18 1720 (!) 167/87     Pulse Rate 08/21/18 1720 100     Resp --      Temp 08/21/18 1720 97.8 F (36.6 C)     Temp Source 08/21/18 1720 Axillary     SpO2 08/21/18 1720 99 %     Weight 08/21/18 1721 170 lb (77.1 kg)     Height 08/21/18 1721 5\' 4"  (1.626 m)     Head Circumference --      Peak Flow --      Pain Score --      Pain Loc --      Pain Edu? --      Excl. in [place]? --     Constitutional: No distress, but laying in bed speaking nonsensically.  Gets a  little agitated when approached, but in no acute distress and not combative at the present time. Eyes: Conjunctivae are normal. Head: Atraumatic. Nose: No congestion/rhinnorhea. Mouth/Throat: Mucous membranes are slightly dry Neck: No stridor.  Cardiovascular: Normal rate, regular rhythm. Grossly normal heart sounds.  Good peripheral circulation. Respiratory: Normal respiratory effort.  No retractions. Lungs CTAB. Gastrointestinal: Soft and nontender. No distention. Musculoskeletal: No lower extremity tenderness nor edema.  Moves all extremities well without any noted deficits. Neurologic: Grossly no deficits are realized Skin:  Skin is warm, dry and intact. No rash noted. Psychiatric: Mood and affect are abnormal, slightly elevated, somewhat agitated, a little aggressive when approached but not overtly combative. ____________________________________________   LABS (all labs ordered are listed, but only abnormal results are displayed)  Labs Reviewed  COMPREHENSIVE METABOLIC PANEL - Abnormal; Notable for the following components:      Result Value   Potassium 3.3 (*)    All other components within normal limits  CBC WITH DIFFERENTIAL/PLATELET  ETHANOL  URINE DRUG SCREEN, QUALITATIVE ([facility] ONLY)  URINALYSIS, COMPLETE ([facility]) WITH MICROSCOPIC   ____________________________________________  EKG   ____________________________________________  RADIOLOGY  Ct Head Wo Contrast  Result Date: 08/21/2018 CLINICAL DATA:  Confusion. EXAM: CT HEAD WITHOUT CONTRAST TECHNIQUE: Contiguous axial images were obtained from the base of the skull through the vertex without intravenous contrast. COMPARISON:  CT scan of March 04, 2018. FINDINGS: Brain: Minimal diffuse cortical atrophy is noted. Mild chronic ischemic white matter disease is noted. No mass effect or midline shift is noted. Ventricular size is within normal limits. There is no evidence of mass lesion, hemorrhage or acute infarction.  Vascular: No hyperdense vessel or unexpected calcification. Skull: Normal. Negative for fracture or focal lesion. Sinuses/Orbits: No acute finding. Other: None. IMPRESSION: Minimal diffuse cortical atrophy. Mild chronic ischemic white matter disease. No acute intracranial abnormality seen. Electronically Signed   By: Marijo Conception M.D.   On: 08/21/2018 19:00    ____________________________________________   PROCEDURES  Procedure(s) performed: None  Procedures  Critical Care performed: No  ____________________________________________   INITIAL IMPRESSION / ASSESSMENT AND PLAN / ED COURSE  Pertinent labs & imaging results that were available during my care of the patient were reviewed by me and considered in my medical decision making (see chart for details).   Patient with behavioral change, does have a history of medical conditions as well as psychiatric etiologies.  Will test broadly, certainly differential high is that this is likely related to psychiatric disease but needs further work-up.  Will obtain a head CT basic lab work.  Clinical Course as of Aug 20 2340  Tue Aug 21, 2018  1820 Patient has been calm, does continue to occasionally  voice things are somewhat incoherent, but he is not being combative or agitated.  Not attempting to get out of bed or anything.  Also the hospital very short on one-to-one monitoring personnel, will switch to every 15 minute checks with rover sitting outside of his door, decision made in consultation with charge RN    [MQ]  2154 Patient is calm, resting at this time.  Normal vital signs.  Awaiting psychiatry consultation   [MQ]    Clinical Course User Index [MQ] Sharyn CreamerQuale, , MD   Differential diagnosis is broad, certainly psychiatric etiology given his known history is high on the list, but also medical etiology given the patient's age and comorbidities.  Hemodynamically stable and not in acute distress, but nothing when I would say is somewhat  psychotic.  Will check screening labs, head CT, obtain input from psychiatry service, keep patient under IVC.  Due to the patient's complex history, I have requested a consultation by in-house in person psychiatry for tomorrow as I do not know that the patient would be too compliant with tele-psychiatry evaluation  Ongoing work-up Dr. York CeriseForbach, follow-up psychiatry recommendations also pending lab work.  Urinalysis.  ____________________________________________   FINAL CLINICAL IMPRESSION(S) / ED DIAGNOSES  Final diagnoses:  Altered mental status, unspecified altered mental status type  Aggressive behavior        Note:  This document was prepared using Dragon voice recognition software and may include unintentional dictation errors       Sharyn CreamerQuale, , MD 08/21/18 2343

## 2018-08-22 ENCOUNTER — Emergency Department: Payer: Medicare Other

## 2018-08-22 DIAGNOSIS — F201 Disorganized schizophrenia: Secondary | ICD-10-CM

## 2018-08-22 LAB — SARS CORONAVIRUS 2 BY RT PCR (HOSPITAL ORDER, PERFORMED IN ~~LOC~~ HOSPITAL LAB): SARS Coronavirus 2: NEGATIVE

## 2018-08-22 MED ORDER — AMLODIPINE BESYLATE 10 MG PO TABS
10.0000 mg | ORAL_TABLET | Freq: Every day | ORAL | Status: DC
  Administered 2018-08-22 – 2018-09-03 (×13): 10 mg via ORAL
  Filled 2018-08-22: qty 2
  Filled 2018-08-22 (×4): qty 1
  Filled 2018-08-22: qty 2
  Filled 2018-08-22 (×3): qty 1
  Filled 2018-08-22: qty 2
  Filled 2018-08-22: qty 1
  Filled 2018-08-22: qty 2
  Filled 2018-08-22: qty 1
  Filled 2018-08-22: qty 2

## 2018-08-22 MED ORDER — LAMOTRIGINE 25 MG PO TABS
150.0000 mg | ORAL_TABLET | Freq: Two times a day (BID) | ORAL | Status: DC
  Administered 2018-08-22 – 2018-09-03 (×24): 150 mg via ORAL
  Filled 2018-08-22 (×23): qty 1
  Filled 2018-08-22: qty 2
  Filled 2018-08-22 (×3): qty 1

## 2018-08-22 MED ORDER — LORAZEPAM 2 MG/ML IJ SOLN
1.0000 mg | Freq: Once | INTRAMUSCULAR | Status: AC
  Administered 2018-08-22: 1 mg via INTRAVENOUS

## 2018-08-22 MED ORDER — OLANZAPINE 10 MG PO TABS
20.0000 mg | ORAL_TABLET | Freq: Every day | ORAL | Status: DC
  Administered 2018-08-23 – 2018-09-02 (×11): 20 mg via ORAL
  Filled 2018-08-22 (×13): qty 2

## 2018-08-22 MED ORDER — LORAZEPAM 2 MG/ML IJ SOLN
INTRAMUSCULAR | Status: AC
  Administered 2018-08-22: 17:00:00 1 mg via INTRAVENOUS
  Filled 2018-08-22: qty 1

## 2018-08-22 MED ORDER — ATORVASTATIN CALCIUM 20 MG PO TABS
10.0000 mg | ORAL_TABLET | Freq: Every day | ORAL | Status: DC
  Administered 2018-08-22 – 2018-09-03 (×13): 10 mg via ORAL
  Filled 2018-08-22 (×14): qty 1

## 2018-08-22 MED ORDER — ACETAMINOPHEN 325 MG PO TABS
650.0000 mg | ORAL_TABLET | Freq: Three times a day (TID) | ORAL | Status: DC | PRN
  Administered 2018-08-25: 650 mg via ORAL
  Filled 2018-08-22: qty 2

## 2018-08-22 MED ORDER — MEMANTINE HCL 5 MG PO TABS
5.0000 mg | ORAL_TABLET | Freq: Every day | ORAL | Status: DC
  Administered 2018-08-22 – 2018-09-03 (×13): 5 mg via ORAL
  Filled 2018-08-22 (×14): qty 1

## 2018-08-22 MED ORDER — VITAMIN B-12 1000 MCG PO TABS
1000.0000 ug | ORAL_TABLET | Freq: Every day | ORAL | Status: DC
  Administered 2018-08-22 – 2018-09-03 (×13): 1000 ug via ORAL
  Filled 2018-08-22 (×14): qty 1

## 2018-08-22 NOTE — ED Notes (Signed)
Xray in room at this time.

## 2018-08-22 NOTE — ED Notes (Signed)
Pt drank 1/3 of ensure

## 2018-08-22 NOTE — Consult Note (Signed)
[facility] Face-to-Face Psychiatry Consult   Reason for Consult: Altered mental status Referring Physician: Dr. Fanny BienQuale Patient Identification: Timothy [name] MRN:  [id number] Principal Diagnosis: Schizophrenia Diagnosis: Schizophrenia  Total Time spent with patient: 30 minutes  Subjective: Patient is mumbling and speaking nonsensical Timothy [name] is a 85 y.o. male patient [facility] ED via [facility]. Per EMS discussion with triage nurse the patient lives in a group home and has a history of PTSD and at times tends to experience flashbacks that usually precipitate into these events.  This provider try reaching out to the patient guardian Ms. Damian LeavellVonde Thomas [phone]; [phone] and was unable to make contact with her   The patient was seen face-to-face by this provider; chart reviewed and consulted with Dr. Fanny BienQuale on 08/21/2018 due to the care of the patient. It was discussed with the provider that the patient does meet criteria to be admitted to the a geriatric psychiatric inpatient unit for treatment and stabilization. On evaluation the patient is alert to self only, uncooperative, and mood-congruent with affect.  The patient is exhibiting internal stimuli and presenting with some hallucination and delusional thinking. This provider is unable to assess if the patient is experiencing visual hallucinations, suicidal, homicidal, or self-harm ideations. The patient is presenting with  psychotic behaviors. During an encounter with the patient, he was not able to answer questions appropriately.  Collateral information was not obtained from This provider try reaching out to the patient guardian Ms. Damian LeavellVonde Thomas [phone]; [phone] and was unable to make contact with her   Plan: The patient is a safety risk to self and others due to his aggression and does  require psychiatric inpatient admission for stabilization and treatment.   HPI:  Per Dr. Fanny BienQuale; Timothy [name] is a 85 y.o. male history of  schizophrenia, seizures, TIAs, A. fib, stroke, and behavioral disturbance  EMS reports the patient was at his group home, he became agitated had "tore up" his room and was acting very agitated and aggressive prompting police and EMS call.  Group home reported concerned that this was due to his mental illness and flashbacks from [place] with previous history of similar in the past.  Past Psychiatric History:  Schizophrenia (HCC)  Risk to Self:  Yes Risk to Others:  Yes Prior Inpatient Therapy:  Yes Prior Outpatient Therapy:  Yes  Past Medical History:  Past Medical History:  Diagnosis Date  . Hypertension   . Schizophrenia (HCC)   . Seizures (HCC)   . TIA (transient ischemic attack)    History reviewed. No pertinent surgical history. Family History: History reviewed. No pertinent family history. Family Psychiatric  History: Social History:  Social History   Substance and Sexual Activity  Alcohol Use No  . Alcohol/week: 0.0 standard drinks     Social History   Substance and Sexual Activity  Drug Use No    Social History   Socioeconomic History  . Marital status: Widowed    Spouse name: Not on file  . Number of children: Not on file  . Years of education: Not on file  . Highest education level: Not on file  Occupational History  . Not on file  Social Needs  . Financial resource strain: Not on file  . Food insecurity:    Worry: Not on file    Inability: Not on file  . Transportation needs:    Medical: Not on file    Non-medical: Not on file  Tobacco Use  . Smoking status: Current Every Day  Smoker    Packs/day: 0.25    Years: 25.00    Pack years: 6.25    Types: Cigarettes  . Smokeless tobacco: Never Used  Substance and Sexual Activity  . Alcohol use: No    Alcohol/week: 0.0 standard drinks  . Drug use: No  . Sexual activity: Not Currently  Lifestyle  . Physical activity:    Days per week: Not on file    Minutes per session: Not on file  . Stress: Not  on file  Relationships  . Social connections:    Talks on phone: Not on file    Gets together: Not on file    Attends religious service: Not on file    Active member of club or organization: Not on file    Attends meetings of clubs or organizations: Not on file    Relationship status: Not on file  Other Topics Concern  . Not on file  Social History Narrative  . Not on file   Additional Social History:    Allergies:  No Known Allergies  Labs:  Results for orders placed or performed during the hospital encounter of 08/21/18 (from the past 48 hour(s))  Comprehensive metabolic panel     Status: Abnormal   Collection Time: 08/21/18  5:26 PM  Result Value Ref Range   Sodium 142 135 - 145 mmol/L   Potassium 3.3 (L) 3.5 - 5.1 mmol/L   Chloride 104 98 - 111 mmol/L   CO2 26 22 - 32 mmol/L   Glucose, Bld 90 70 - 99 mg/dL   BUN 20 8 - 23 mg/dL   Creatinine, Ser 9.411.02 0.61 - 1.24 mg/dL   Calcium 9.8 8.9 - 74.010.3 mg/dL   Total Protein 8.0 6.5 - 8.1 g/dL   Albumin 4.3 3.5 - 5.0 g/dL   AST 29 15 - 41 U/L   ALT 24 0 - 44 U/L   Alkaline Phosphatase 69 38 - 126 U/L   Total Bilirubin 1.1 0.3 - 1.2 mg/dL   GFR calc non Af Amer >60 >60 mL/min   GFR calc Af Amer >60 >60 mL/min   Anion gap 12 5 - 15    Comment: Performed at [facility], [address] [place], [place] [address]  CBC with Diff     Status: None   Collection Time: 08/21/18  5:26 PM  Result Value Ref Range   WBC 4.5 4.0 - 10.5 K/uL   RBC 5.75 4.22 - 5.81 MIL/uL   Hemoglobin 15.7 13.0 - 17.0 g/dL   HCT 18.547.7 63.139.0 - 49.752.0 %   MCV 83.0 80.0 - 100.0 fL   MCH 27.3 26.0 - 34.0 pg   MCHC 32.9 30.0 - 36.0 g/dL   RDW 02.613.9 37.811.5 - 58.815.5 %   Platelets 159 150 - 400 K/uL   nRBC 0.0 0.0 - 0.2 %   Neutrophils Relative % 74 %   Neutro Abs 3.3 1.7 - 7.7 K/uL   Lymphocytes Relative 17 %   Lymphs Abs 0.7 0.7 - 4.0 K/uL   Monocytes Relative 9 %   Monocytes Absolute 0.4 0.1 - 1.0 K/uL   Eosinophils Relative 0 %   Eosinophils  Absolute 0.0 0.0 - 0.5 K/uL   Basophils Relative 0 %   Basophils Absolute 0.0 0.0 - 0.1 K/uL   Immature Granulocytes 0 %   Abs Immature Granulocytes 0.02 0.00 - 0.07 K/uL    Comment: Performed at [facility], [address] [place], [place] [address]  Ethanol  Status: None   Collection Time: 08/21/18  5:26 PM  Result Value Ref Range   Alcohol, Ethyl (B) <10 <10 mg/dL    Comment: (NOTE) Lowest detectable limit for serum alcohol is 10 mg/dL. For medical purposes only. Performed at [facility], [address], [place], [place] [address]     Current Facility-Administered Medications  Medication Dose Route Frequency Provider Last Rate Last Dose  . acetaminophen (TYLENOL) tablet 650 mg  650 mg Oral Q8H PRN Hinda Kehr, MD      . amLODipine (NORVASC) tablet 10 mg  10 mg Oral Daily Hinda Kehr, MD      . atorvastatin (LIPITOR) tablet 10 mg  10 mg Oral Daily Hinda Kehr, MD      . lamoTRIgine (LAMICTAL) tablet 150 mg  150 mg Oral BID Hinda Kehr, MD      . memantine [facility]) tablet 5 mg  5 mg Oral Daily Hinda Kehr, MD      . OLANZapine [facility]) tablet 20 mg  20 mg Oral QHS Hinda Kehr, MD      . vitamin B-12 (CYANOCOBALAMIN) tablet 1,000 mcg  1,000 mcg Oral Daily Hinda Kehr, MD       Current Outpatient Medications  Medication Sig Dispense Refill  . acetaminophen (TYLENOL) 325 MG tablet Take 2 tablets (650 mg total) by mouth 2 (two) times daily. As needed (Patient taking differently: Take 650 mg by mouth 2 (two) times daily as needed. As needed) 60 tablet 5  . amLODipine (NORVASC) 10 MG tablet Take 1 tablet (10 mg total) by mouth daily. 30 tablet 5  . atorvastatin (LIPITOR) 10 MG tablet TAKE 1 TABLET BY MOUTH DAILY. (Patient taking differently: Take 10 mg by mouth every evening. ) 30 tablet 0  . lamoTRIgine (LAMICTAL) 150 MG tablet Take 150 mg by mouth 2 (two) times daily.    Marland Kitchen MAPAP ARTHRITIS PAIN 650 MG CR tablet TAKE 1 TABLET BY MOUTH 2 TIMES  DAILY. AS NEEDED (Patient taking differently: Take 650 mg by mouth 2 (two) times daily. ) 60 tablet 0  . memantine (NAMENDA) 5 MG tablet TAKE 1 TABLET BY MOUTH DAILY (Patient taking differently: Take 5 mg by mouth daily. ) 30 tablet 0  . OLANZapine (ZYPREXA) 10 MG tablet Take 20 mg by mouth at bedtime.     . vitamin B-12 (CYANOCOBALAMIN) 1000 MCG tablet TAKE 1 TABLET BY MOUTH DAILY. (Patient taking differently: Take 1,000 mcg by mouth daily. ) 30 tablet 0    Musculoskeletal: Strength & Muscle Tone: increased Gait & Station: unsteady Patient leans: Backward  Psychiatric Specialty Exam: Physical Exam  Nursing note and vitals reviewed. Constitutional: He appears well-developed and well-nourished.  HENT:  Head: Normocephalic and atraumatic.  Eyes: Pupils are equal, round, and reactive to light. Conjunctivae are normal.  Neck: Normal range of motion. Neck supple.  Cardiovascular: Normal rate and regular rhythm.  Respiratory: Effort normal and breath sounds normal.  Musculoskeletal: Normal range of motion.  Neurological: He is alert.  Skin: Skin is warm and dry.    Review of Systems  Psychiatric/Behavioral: Positive for hallucinations and memory loss. The patient is nervous/anxious and has insomnia.   All other systems reviewed and are negative.   Blood pressure (!) 163/68, pulse 94, temperature 97.8 F (36.6 C), temperature source Axillary, height 5\' 4"  (1.626 m), weight 77.1 kg, SpO2 99 %.Body mass index is 29.18 kg/m.  General Appearance: Bizarre and Disheveled  Eye Contact:  None  Speech:  Garbled  Volume:  Increased  Mood:  Irritable  Affect:  Inappropriate  Thought Process:  Disorganized  Orientation:  Other:  Self  Thought Content:  Illogical  Suicidal Thoughts:  Unable to assess  Homicidal Thoughts:  Unable to assess  Memory:  Unable to assess  Judgement:  Poor  Insight:  Lacking  Psychomotor Activity:  Decreased and Restlessness  Concentration:  Concentration: Poor  and Attention Span: Poor  Recall:  Poor  Fund of Knowledge:  Unable to assess  Language:  Poor  Akathisia:  NA  Handed:  Right  AIMS (if indicated):     Assets:  Physical Health Social Support  ADL's:  Impaired  Cognition:  Impaired,  Severe  Sleep:        Treatment Plan Summary: Daily contact with patient to assess and evaluate symptoms and progress in treatment, Medication management and Plan Patient does meet criteria for geriatric psychiatric inpatient admission for stabilization and treatment.  Disposition: Supportive therapy provided about ongoing stressors. Patient does meet criteria for geriatric psychiatric inpatient admission for stabilization and treatment.  Catalina GravelJacqueline Thomspon, NP 08/22/2018 2:46 AM

## 2018-08-22 NOTE — ED Notes (Addendum)
Pt will briefly talk to this RN. Denies pain. Pt figgiting but calmer than earlier. Pt will allow this RN to take some vital signs. Pt will look directly at this RN when his name is spoken.

## 2018-08-22 NOTE — ED Notes (Signed)
Pt has calmed down.

## 2018-08-22 NOTE — ED Notes (Signed)
Pt kicking end of bed... security into room to move pt up in bed so he doesn't hurt his feet on end of bed. Pt combative. Only able to move up about a half a foot since combative. Pt's feet now on mattress.

## 2018-08-22 NOTE — ED Notes (Signed)
Pts face wiped with warm rag at this time

## 2018-08-22 NOTE — ED Notes (Signed)
Seizure pads applied to rails of bed as pt keeps slamming arms into either side of the rail. Pt does not calm to reassurance. Pt continues to have jumbled/pressured speech.

## 2018-08-22 NOTE — ED Notes (Signed)
This RN attempted to feed patient biscuit.  Patient took a bite and chewed for a very long time.  Afterward, patient coughed several times.  Patient is in no obvious distress at this time.  Respirations even and non labored at this time.  Patient calmly talking to himself as he has been.  Patient took several sips of water and juice without issue.  MD notified and new orders placed.

## 2018-08-22 NOTE — ED Notes (Signed)
Pt resting calmly in bed.  

## 2018-08-22 NOTE — Evaluation (Signed)
Clinical/Bedside Swallow Evaluation Patient Details  Name: Timothy [name] MRN: [id number] Date of Birth: April 04, 1932  Today's Date: 08/22/2018 Time: SLP Start Time (ACUTE ONLY): 2703 SLP Stop Time (ACUTE ONLY): 1545 SLP Time Calculation (min) (ACUTE ONLY): 60 min  Past Medical History:  Past Medical History:  Diagnosis Date  . Hypertension   . Schizophrenia ([place])   . Seizures ([place])   . TIA (transient ischemic attack)    Past Surgical History: History reviewed. No pertinent surgical history. HPI:  Pt is a 85 y.o. male history of Schizophrenia, Seizures, TIAs, A. fib, stroke, and Dementia w/ behavioral disturbance. EMS reports the patient was at his group home, he became agitated had "tore up" his room and was acting very agitated and aggressive prompting police and EMS call.  Group home reported concerned that this was due to his mental illness and flashbacks from [place] with previous history of similar behavior in the past.    Assessment / Plan / Recommendation Clinical Impression  Pt appears to present w/ oral phase dysphagia w/ increased risk for pharyngeal phase dysphagia secondary to impact from severely declined Mental Status at this time. He does not present w/ consistent attention to task; freqent eye/facial tics and incoherent, pressured/muttered speech. Per NSG notes, he has also had outburst behavior and requires Event organiser. Pt was calm at the time of this evaluation. He was positioned upright and given trials of thin liquids, purees, and soft solid foods. Pt demonstrated adequate toleration of smaller sips via straw of thin liquids when volume was controlled by pinching straw or limiting consecutive, gulping sips; no overt s/s of aspiration were noted and no decline in vocal quality or respiratory presentation. With gulping, impulsive drinking, he exhibited overt coughing x1. He requires supervision and limiting of bolus volume to ensure safer drinking/intake. During trials of  puree, no s/s of aspiration were noted and fully adequate oral bolus control noted. Pt cleared puree trials in a timely manner w/ appropriate bolus management. However, w/ increased texture trials(chopped banana), pt exhibited lengthier oral phase time for bolus management, A-P transfer, and oral clearing. Suspect decreased Mental/Cognitive attention to task impacted his oral phase for full bolus management. Pt required full feeding assistance; verbal/tactile cues during bolus presentation for attention to task. OM exam could not be followed through with but appeared grossly [facility] for bolus management/clearing.  Recommend diet modification to a Dysphagia level 2 (MINCED foods) w/ Thin liquids; strict aspiration precautions; Pills CRUSHED in puree for safer swallowing; FULL feeding assistance and only give po's when pt is calm/attentive. NSG updated.  SLP Visit Diagnosis: Dysphagia, oropharyngeal phase (R13.12)(impacted by Mental/Cognitive status decline)    Aspiration Risk  Mild aspiration risk(but reduced following precautions)    Diet Recommendation  Dysphagia level 2 (MINCED foods) diet w/ Thin liquids; strict aspiration precautions and full feeding support w/ all po's - pt must be calm and attentive. Monitor any impulsive eating/drinking behaviors.  Medication Administration: Crushed with puree(for safer swallowing)    Other  Recommendations Recommended Consults: (Dietician) Oral Care Recommendations: Oral care BID;Staff/trained caregiver to provide oral care;Oral care before and after PO Other Recommendations: (n/a)   Follow up Recommendations None(TBD)      Frequency and Duration min 2x/week  1 week       Prognosis Prognosis for Safe Diet Advancement: Fair Barriers to Reach Goals: Cognitive deficits;Severity of deficits;Behavior      Swallow Study   General Date of Onset: 08/21/18 HPI: Pt is a 85 y.o. male history of  Schizophrenia, Seizures, TIAs, A. fib, stroke, and Dementia w/  behavioral disturbance. EMS reports the patient was at his group home, he became agitated had "tore up" his room and was acting very agitated and aggressive prompting police and EMS call.  Group home reported concerned that this was due to his mental illness and flashbacks from [place] with previous history of similar behavior in the past.  Type of Study: Bedside Swallow Evaluation Previous Swallow Assessment: unknown Diet Prior to this Study: Regular;Thin liquids Temperature Spikes Noted: No(wbc 4.5) Respiratory Status: Room air History of Recent Intubation: No Behavior/Cognition: Alert;Cooperative;Confused;Distractible;Requires cueing;Doesn't follow directions(Tics; Nonverbal) Oral Cavity Assessment: Within Functional Limits(grossly; difficult to fully assess) Oral Care Completed by SLP: Recent completion by staff Oral Cavity - Dentition: Adequate natural dentition Vision: (n/a) Self-Feeding Abilities: Total assist Patient Positioning: Upright in bed(needed positioning) Baseline Vocal Quality: Low vocal intensity(muttered/mumbled speech; incoherent mostly) Volitional Cough: Cognitively unable to elicit Volitional Swallow: Unable to elicit    Oral/Motor/Sensory Function Overall Oral Motor/Sensory Function: (grossly adequate w/ bolus management)   Ice Chips Ice chips: Not tested   Thin Liquid Thin Liquid: Impaired(w/ larger, impulsive sips) Presentation: Straw(fed; ~4+ ozs) Oral Phase Impairments: (none) Oral Phase Functional Implications: (none) Pharyngeal  Phase Impairments: Cough - Immediate(x1) Other Comments: w/ smaller sips controlled by pinching straw or limiting consecutive, gulping sips, pt exhibited no overt s/s of aspiration. With gulping, impulsive drinking, he exhibited overt coughing x1. He requires supervision and limiting of bolus volume to ensure safer drinking/intake.     Nectar Thick Nectar Thick Liquid: Not tested   Honey Thick Honey Thick Liquid: Not tested   Puree  Puree: Within functional limits Presentation: Spoon(fed; ~3ozs) Other Comments: opened mouth readily for each bite   Solid     Solid: Impaired Presentation: Spoon(fed; 8 trials of chopped banana) Oral Phase Impairments: Impaired mastication;Poor awareness of bolus(min) Oral Phase Functional Implications: Prolonged oral transit;Impaired mastication Pharyngeal Phase Impairments: (none)       Jerilynn SomKatherine Watson, MS, CCC-SLP Watson,Katherine 08/22/2018,4:38 PM

## 2018-08-22 NOTE — ED Notes (Signed)
Pt given 2 cups of applesauce and 1 cup of water at this time

## 2018-08-22 NOTE — ED Notes (Signed)
Pt keeps moving arm when BP cuff goes off and bending elbow. Will attempt to collect accurate BP again.

## 2018-08-22 NOTE — ED Notes (Signed)
Pt urinated in bottoms; peri care and linens changed; attempted to reposition pt higher in the bed with Sherlene Shams assisting. Pt became combative and was not calmed by reassurance and explanation. Gave pt some space and time to calm down. Will attempt to reposition once pt calmer. Door remains open. Security remains outside room. Rails remain up and bed locked in lowest position.

## 2018-08-22 NOTE — ED Notes (Addendum)
Pt continues to have pressured speech. Some words comprehensible others not. Pt denies pain. Pt would like some more apple sauce. Will be given soon. Cardiac monitor reattached to pt as he had previously pulled many leads loose. Pt currently tapping feet at end of bed but otherwise calm/alert.

## 2018-08-22 NOTE — BH Assessment (Signed)
Assessment Note  Timothy [name] is an 85 y.o. male Pt arrives via [facility] from a group home for "tearing up his room" an d being combative. Per EMS pt has a hx of PTSD and will have flashbacks that precipitate these events. Pt arrives with no pants on and only a shirt and socks. Pt flailing arms and chanting phrases over and over during triage.   Pt unable to complete assessment due to altered mental status.  Pt is a veteran and will be faxed to the [facility] for placement and concideration  Diagnosis: Altered  Mental Status  Past Medical History:  Past Medical History:  Diagnosis Date  . Hypertension   . Schizophrenia ([place])   . Seizures ([place])   . TIA (transient ischemic attack)     History reviewed. No pertinent surgical history.  Family History: History reviewed. No pertinent family history.  Social History:  reports that he has been smoking cigarettes. He has a 6.25 pack-year smoking history. He has never used smokeless tobacco. He reports that he does not drink alcohol or use drugs.  Additional Social History:     CIWA: CIWA-Ar BP: (!) 160/77 Pulse Rate: 83 COWS:    Allergies: No Known Allergies  Home Medications: (Not in a hospital admission)   OB/GYN Status:  No LMP for male patient.  General Assessment Data Assessment unable to be completed: Yes Reason for not completing assessment: Pt has altered mental status and mumbled speech  Admission Status: Involuntary                    Mental Status Report Motor Activity: Unable to assess                            Advance Directives (For Healthcare) Does Patient Have a Medical Advance Directive?: Unable to assess, patient is non-responsive or altered mental status(uta)          Disposition:     On Site Evaluation by:   Reviewed with Physician:    Kahlin Mark D Eurydice Calixto 08/22/2018 6:06 AM

## 2018-08-22 NOTE — ED Notes (Signed)
Ainsley Spinner in pt's demographics from [facility] care facility called to provide baseline info. Concerned for pt. Given brief update on pt.

## 2018-08-22 NOTE — ED Provider Notes (Signed)
-----------------------------------------   5:13 AM on 08/22/2018 -----------------------------------------   Blood pressure (!) 160/77, pulse 83, temperature 97.8 F (36.6 C), temperature source Axillary, height 1.626 m (5\' 4" ), weight 77.1 kg, SpO2 99 %.  The patient is calm and cooperative at this time.  There have been no acute events since the last update.  Awaiting disposition plan from Behavioral Medicine team.   Hinda Kehr, MD 08/22/18 [phone]

## 2018-08-22 NOTE — ED Notes (Signed)
Pt dressed out in burgundy scrubs, pt cleaned and brief placed on pt at this time

## 2018-08-22 NOTE — ED Notes (Signed)
Pt relaxed with eyes closed upon entrance to room. Pt also muttering softly to self.

## 2018-08-22 NOTE — ED Notes (Signed)
Pt ate three bites of his meal tray and did not want any more. Pt repositioned in bed.

## 2018-08-22 NOTE — ED Notes (Signed)
Pt urinated on self and on bed. This tech and [place] changed pt into clean depends. Clean chucks and new bed sheet. No other needs voiced at this time. Will continue to monitor Q15 minute rounds.

## 2018-08-22 NOTE — Consult Note (Signed)
[facility] Face-to-Face Psychiatry Consult   Reason for Consult: Altered mental status Referring Physician: Dr. Fanny BienQuale Patient Identification: Timothy [name] MRN:  [id number] Principal Diagnosis: Schizophrenia Diagnosis: Schizophrenia  Total Time spent with patient: 35 minutes  Patient is seen, chart is reviewed.  On evaluation, patient is staring blankly towards the ceiling.  After several attempts of calling his name, patient does look towards this Clinical research associatewriter.  Patient at first is mumbling but then starts speaking in poetry with rhyming.  Towards the end of his problem, patient states, "you hit me, I hit you back."  At which time patient swings in air at this writer, as this Clinical research associatewriter stepped away. He then looks away, and continues to speak looking towards the ceiling. Per review and nursing report, patient has not received any of his medications. Medication reconciliation is completed, and home meds are restarted.  We will reevaluate patient after he has been reliably taking medications.  Per initial psychiatric intake from nurse practitioner overnight: Subjective: Patient is mumbling and speaking nonsensical Timothy [name] is a 85 y.o. male patient [facility]. Per EMS discussion with triage nurse the patient lives in a group home and has a history of PTSD and at times tends to experience flashbacks that usually precipitate into these events.  This provider try reaching out to the patient guardian Ms. Damian LeavellVonde Thomas [phone]; [phone] and was unable to make contact with her  The patient was seen face-to-face by this provider; chart reviewed and consulted with Dr. Fanny BienQuale on 08/21/2018 due to the care of the patient. It was discussed with the provider that the patient does meet criteria to be admitted to the a geriatric psychiatric inpatient unit for treatment and stabilization. On evaluation the patient is alert to self only, uncooperative, and mood-congruent with affect.  The patient is  exhibiting internal stimuli and presenting with some hallucination and delusional thinking. This provider is unable to assess if the patient is experiencing visual hallucinations, suicidal, homicidal, or self-harm ideations. The patient is presenting with  psychotic behaviors. During an encounter with the patient, he was not able to answer questions appropriately. Collateral information was not obtained from This provider try reaching out to the patient guardian Ms. Damian LeavellVonde Thomas [phone]; [phone] and was unable to make contact with her Plan: The patient is a safety risk to self and others due to his aggression and does  require psychiatric inpatient admission for stabilization and treatment.  HPI:  Per Dr. Fanny BienQuale; Timothy [name] is a 85 y.o. male history of schizophrenia, seizures, TIAs, A. fib, stroke, and behavioral disturbance EMS reports the patient was at his group home, he became agitated had "tore up" his room and was acting very agitated and aggressive prompting police and EMS call.  Group home reported concerned that this was due to his mental illness and flashbacks from [place] with previous history of similar in the past.  Past Psychiatric History: Schizophrenia (HCC)  Risk to Self:  Yes Risk to Others:  Yes Prior Inpatient Therapy:  Yes Prior Outpatient Therapy:  Yes  Past Medical History:  Past Medical History:  Diagnosis Date  . Hypertension   . Schizophrenia (HCC)   . Seizures (HCC)   . TIA (transient ischemic attack)    History reviewed. No pertinent surgical history. Family History: History reviewed. No pertinent family history. Family Psychiatric  History: None indicated  Social History:  Social History   Substance and Sexual Activity  Alcohol Use No  . Alcohol/week: 0.0 standard drinks  Social History   Substance and Sexual Activity  Drug Use No    Social History   Socioeconomic History  . Marital status: Widowed    Spouse name: Not on file  .  Number of children: Not on file  . Years of education: Not on file  . Highest education level: Not on file  Occupational History  . Not on file  Social Needs  . Financial resource strain: Not on file  . Food insecurity:    Worry: Not on file    Inability: Not on file  . Transportation needs:    Medical: Not on file    Non-medical: Not on file  Tobacco Use  . Smoking status: Current Every Day Smoker    Packs/day: 0.25    Years: 25.00    Pack years: 6.25    Types: Cigarettes  . Smokeless tobacco: Never Used  Substance and Sexual Activity  . Alcohol use: No    Alcohol/week: 0.0 standard drinks  . Drug use: No  . Sexual activity: Not Currently  Lifestyle  . Physical activity:    Days per week: Not on file    Minutes per session: Not on file  . Stress: Not on file  Relationships  . Social connections:    Talks on phone: Not on file    Gets together: Not on file    Attends religious service: Not on file    Active member of club or organization: Not on file    Attends meetings of clubs or organizations: Not on file    Relationship status: Not on file  Other Topics Concern  . Not on file  Social History Narrative  . Not on file   Additional Social History:    Allergies:  No Known Allergies  Labs:  Results for orders placed or performed during the hospital encounter of 08/21/18 (from the past 48 hour(s))  Comprehensive metabolic panel     Status: Abnormal   Collection Time: 08/21/18  5:26 PM  Result Value Ref Range   Sodium 142 135 - 145 mmol/L   Potassium 3.3 (L) 3.5 - 5.1 mmol/L   Chloride 104 98 - 111 mmol/L   CO2 26 22 - 32 mmol/L   Glucose, Bld 90 70 - 99 mg/dL   BUN 20 8 - 23 mg/dL   Creatinine, Ser 2.95 0.61 - 1.24 mg/dL   Calcium 9.8 8.9 - 62.1 mg/dL   Total Protein 8.0 6.5 - 8.1 g/dL   Albumin 4.3 3.5 - 5.0 g/dL   AST 29 15 - 41 U/L   ALT 24 0 - 44 U/L   Alkaline Phosphatase 69 38 - 126 U/L   Total Bilirubin 1.1 0.3 - 1.2 mg/dL   GFR calc non Af Amer  >60 >60 mL/min   GFR calc Af Amer >60 >60 mL/min   Anion gap 12 5 - 15    Comment: Performed at [facility], [address] [place], [place] [address]  CBC with Diff     Status: None   Collection Time: 08/21/18  5:26 PM  Result Value Ref Range   WBC 4.5 4.0 - 10.5 K/uL   RBC 5.75 4.22 - 5.81 MIL/uL   Hemoglobin 15.7 13.0 - 17.0 g/dL   HCT 78.4 69.6 - 29.5 %   MCV 83.0 80.0 - 100.0 fL   MCH 27.3 26.0 - 34.0 pg   MCHC 32.9 30.0 - 36.0 g/dL   RDW 28.4 13.2 - 44.0 %   Platelets  159 150 - 400 K/uL   nRBC 0.0 0.0 - 0.2 %   Neutrophils Relative % 74 %   Neutro Abs 3.3 1.7 - 7.7 K/uL   Lymphocytes Relative 17 %   Lymphs Abs 0.7 0.7 - 4.0 K/uL   Monocytes Relative 9 %   Monocytes Absolute 0.4 0.1 - 1.0 K/uL   Eosinophils Relative 0 %   Eosinophils Absolute 0.0 0.0 - 0.5 K/uL   Basophils Relative 0 %   Basophils Absolute 0.0 0.0 - 0.1 K/uL   Immature Granulocytes 0 %   Abs Immature Granulocytes 0.02 0.00 - 0.07 K/uL    Comment: Performed at [facility], [address] [place], [place] [address]  Ethanol     Status: None   Collection Time: 08/21/18  5:26 PM  Result Value Ref Range   Alcohol, Ethyl (B) <10 <10 mg/dL    Comment: (NOTE) Lowest detectable limit for serum alcohol is 10 mg/dL. For medical purposes only. Performed at [facility], [address] [place], [place] [address]   SARS Coronavirus 2 (CEPHEID - Performed in [facility] hospital lab), Hosp Order     Status: None   Collection Time: 08/22/18 11:55 AM  Result Value Ref Range   SARS Coronavirus 2 NEGATIVE NEGATIVE    Comment: (NOTE) If result is NEGATIVE SARS-CoV-2 target nucleic acids are NOT DETECTED. The SARS-CoV-2 RNA is generally detectable in upper and lower  respiratory specimens during the acute phase of infection. The lowest  concentration of SARS-CoV-2 viral copies this assay can detect is 250  copies / mL. A negative result does not preclude SARS-CoV-2 infection  and  should not be used as the sole basis for treatment or other  patient management decisions.  A negative result may occur with  improper specimen collection / handling, submission of specimen other  than nasopharyngeal swab, presence of viral mutation(s) within the  areas targeted by this assay, and inadequate number of viral copies  (<250 copies / mL). A negative result must be combined with clinical  observations, patient history, and epidemiological information. If result is POSITIVE SARS-CoV-2 target nucleic acids are DETECTED. The SARS-CoV-2 RNA is generally detectable in upper and lower  respiratory specimens dur ing the acute phase of infection.  Positive  results are indicative of active infection with SARS-CoV-2.  Clinical  correlation with patient history and other diagnostic information is  necessary to determine patient infection status.  Positive results do  not rule out bacterial infection or co-infection with other viruses. If result is PRESUMPTIVE POSTIVE SARS-CoV-2 nucleic acids MAY BE PRESENT.   A presumptive positive result was obtained on the submitted specimen  and confirmed on repeat testing.  While 2019 novel coronavirus  (SARS-CoV-2) nucleic acids may be present in the submitted sample  additional confirmatory testing may be necessary for epidemiological  and / or clinical management purposes  to differentiate between  SARS-CoV-2 and other Sarbecovirus currently known to infect humans.  If clinically indicated additional testing with an alternate test  methodology [phone]([id number]) is advised. The SARS-CoV-2 RNA is generally  detectable in upper and lower respiratory sp ecimens during the acute  phase of infection. The expected result is Negative. Fact Sheet for Patients:  BoilerBrush.com.cyhttps://www.fda.gov/media/136312/download Fact Sheet for Healthcare Providers: https://pope.com/https://www.fda.gov/media/136313/download This test is not yet approved or cleared by the [place] FDA and has been  authorized for detection and/or diagnosis of SARS-CoV-2 by FDA under an Emergency Use Authorization (EUA).  This EUA will remain in  effect (meaning this test can be used) for the duration of the COVID-19 declaration under Section 564(b)(1) of the Act, 21 U.S.C. section 360bbb-3(b)(1), unless the authorization is terminated or revoked sooner. Performed at [facility], [address], [place], [place] [address]     Current Facility-Administered Medications  Medication Dose Route Frequency Provider Last Rate Last Dose  . acetaminophen (TYLENOL) tablet 650 mg  650 mg Oral Q8H PRN Hinda Kehr, MD      . amLODipine (NORVASC) tablet 10 mg  10 mg Oral Daily Hinda Kehr, MD   10 mg at 08/22/18 1122  . atorvastatin (LIPITOR) tablet 10 mg  10 mg Oral Daily Hinda Kehr, MD   10 mg at 08/22/18 1123  . lamoTRIgine (LAMICTAL) tablet 150 mg  150 mg Oral BID Hinda Kehr, MD   150 mg at 08/22/18 1121  . memantine (NAMENDA) tablet 5 mg  5 mg Oral Daily Hinda Kehr, MD   5 mg at 08/22/18 1122  . OLANZapine (ZYPREXA) tablet 20 mg  20 mg Oral QHS Hinda Kehr, MD      . vitamin B-12 (CYANOCOBALAMIN) tablet 1,000 mcg  1,000 mcg Oral Daily Hinda Kehr, MD   1,000 mcg at 08/22/18 1124   Current Outpatient Medications  Medication Sig Dispense Refill  . acetaminophen (TYLENOL) 325 MG tablet Take 2 tablets (650 mg total) by mouth 2 (two) times daily. As needed (Patient taking differently: Take 650 mg by mouth 2 (two) times daily as needed. As needed) 60 tablet 5  . amLODipine (NORVASC) 10 MG tablet Take 1 tablet (10 mg total) by mouth daily. 30 tablet 5  . atorvastatin (LIPITOR) 10 MG tablet TAKE 1 TABLET BY MOUTH DAILY. (Patient taking differently: Take 10 mg by mouth every evening. ) 30 tablet 0  . lamoTRIgine (LAMICTAL) 150 MG tablet Take 150 mg by mouth 2 (two) times daily.    Marland Kitchen MAPAP ARTHRITIS PAIN 650 MG CR tablet TAKE 1 TABLET BY MOUTH 2 TIMES DAILY. AS NEEDED (Patient taking  differently: Take 650 mg by mouth 2 (two) times daily. ) 60 tablet 0  . memantine (NAMENDA) 5 MG tablet TAKE 1 TABLET BY MOUTH DAILY (Patient taking differently: Take 5 mg by mouth daily. ) 30 tablet 0  . OLANZapine (ZYPREXA) 10 MG tablet Take 20 mg by mouth at bedtime.     . vitamin B-12 (CYANOCOBALAMIN) 1000 MCG tablet TAKE 1 TABLET BY MOUTH DAILY. (Patient taking differently: Take 1,000 mcg by mouth daily. ) 30 tablet 0    Musculoskeletal: Strength & Muscle Tone: increased Gait & Station: unsteady Patient leans: Backward  Psychiatric Specialty Exam: Physical Exam  Nursing note and vitals reviewed. Constitutional: He appears well-developed and well-nourished. No distress.  HENT:  Head: Normocephalic and atraumatic.  Eyes: Pupils are equal, round, and reactive to light.  Neck: Normal range of motion.  Cardiovascular: Normal rate and regular rhythm.  Respiratory: Effort normal. No respiratory distress.  Musculoskeletal: Normal range of motion.  Neurological: He is alert.    Review of Systems  Unable to perform ROS: Psychiatric disorder  Psychiatric/Behavioral: Positive for depression, hallucinations and memory loss. The patient is nervous/anxious and has insomnia.     Blood pressure (!) 153/89, pulse 86, temperature 97.8 F (36.6 C), temperature source Axillary, resp. rate (!) 26, height 5\' 4"  (1.626 m), weight 77.1 kg, SpO2 98 %.Body mass index is 29.18 kg/m.  General Appearance: Bizarre  Eye Contact:  Minimal  Speech:  Clear and Coherent and Garbled  Volume:  Decreased  Mood:  Euthymic  Affect:  Constricted  Thought Process:  Disorganized  Orientation:  Other:  Self  Thought Content:  Illogical  Suicidal Thoughts:  Unable to assess  Homicidal Thoughts:  Unable to assess  Memory:  Unable to assess  Judgement:  Poor  Insight:  Lacking  Psychomotor Activity:  Decreased and Restlessness  Concentration:  Concentration: Poor and Attention Span: Poor  Recall:  Poor  Fund of  Knowledge:  Unable to assess  Language:  Poor  Akathisia:  NA  Handed:  Right  AIMS (if indicated):     Assets:  Physical Health Social Support  ADL's:  Impaired  Cognition:  Impaired,  Severe  Sleep:   Patient does not state     Treatment Plan Summary: Continue involuntary commitment Daily contact with patient to assess and evaluate symptoms and progress in treatment and Medication management  Restart home medications, and continue to monitor for patient safety.  Disposition: Supportive therapy provided about ongoing stressors. Patient does meet criteria for geriatric psychiatric inpatient admission for stabilization and treatment.  Mariel CraftSHEILA M Wyett Narine, MD 08/22/2018 9:18 PM

## 2018-08-23 DIAGNOSIS — F201 Disorganized schizophrenia: Secondary | ICD-10-CM | POA: Diagnosis not present

## 2018-08-23 MED ORDER — HALOPERIDOL LACTATE 5 MG/ML IJ SOLN
INTRAMUSCULAR | Status: AC
  Administered 2018-08-23: 02:00:00 5 mg
  Filled 2018-08-23: qty 1

## 2018-08-23 MED ORDER — LORAZEPAM 2 MG/ML IJ SOLN
INTRAMUSCULAR | Status: AC
  Administered 2018-08-23: 1 mg
  Filled 2018-08-23: qty 1

## 2018-08-23 NOTE — ED Notes (Signed)
Attempted to feed pt. Wiped pts face and eyes with wet cloth. Pt answered yes and no questions but would not wake up enough to feed. Told pt that I would try again and a little while.

## 2018-08-23 NOTE — BH Assessment (Signed)
[facility]   [place], [place] [address] Phone: [phone] Fax: [phone] [facility]. [facility]   [address] [place] [address] Phone: [phone] Fax: [facility]    [address], [place] [address] Phone: [phone] Fax: [facility] [address], [place] [address] Phone: [phone] Fax: [phone] [facility]   [address] [place] [address] Phone: [phone] Fax: [facility]   [place], [place] phone: [phone] Fax: [phone] [facility]   [address] [place] [address] Phone: [phone] Fax: [phone] [facility] [address] [place] [address] Phone: [phone] Fax: [phone] [facility]   [address], [place] [address] Phone: [phone] Fax: [facility]   [address] [place] [address] Phone: [phone] Fax: [facility] Phone: [phone] Fax: [phone]

## 2018-08-23 NOTE — ED Notes (Signed)
All downtime charting recorded on printed sheets by this RN.

## 2018-08-23 NOTE — ED Notes (Signed)
Pt currently resting in bed calmly.

## 2018-08-23 NOTE — BH Assessment (Addendum)
Pt is being reviewed by [facility] [phone]) for geriatric psych placement. The facility is requesting the following to be faxed to [phone]:  - UA (NEEDED)  - EKG (completed/faxed)  - Chest X-Ray (completed/faxed)  - IVC (completed/faxed)

## 2018-08-23 NOTE — ED Notes (Signed)
Pt too asleep to take meds. arousable.

## 2018-08-23 NOTE — Consult Note (Signed)
[facility] Face-to-Face Psychiatry Consult   Reason for Consult: Altered mental status Referring Physician: Dr. Fanny BienQuale Patient Identification: Timothy [name] MRN:  [id number] Principal Diagnosis: Schizophrenia Diagnosis: Schizophrenia  Total Time spent with patient: 15 minutes  Patient is seen, chart is reviewed.  Per medication administration record, patient required Haldol 5 mg and Ativan 2 mg at 0210 to 0216 for agitated behavior. On assessment this morning, patient is sedated.  He arouses briefly, but makes minimal contact and only mumbles nonsensically.     Per initial psychiatric intake from nurse practitioner overnight: Subjective: Patient is mumbling and speaking nonsensical Timothy [name] is a 85 y.o. male patient [facility]. Per EMS discussion with triage nurse the patient lives in a group home and has a history of PTSD and at times tends to experience flashbacks that usually precipitate into these events.  This provider try reaching out to the patient guardian Ms. Damian LeavellVonde Thomas [phone]; [phone] and was unable to make contact with her  The patient was seen face-to-face by this provider; chart reviewed and consulted with Dr. Fanny BienQuale on 08/21/2018 due to the care of the patient. It was discussed with the provider that the patient does meet criteria to be admitted to the a geriatric psychiatric inpatient unit for treatment and stabilization. On evaluation the patient is alert to self only, uncooperative, and mood-congruent with affect.  The patient is exhibiting internal stimuli and presenting with some hallucination and delusional thinking. This provider is unable to assess if the patient is experiencing visual hallucinations, suicidal, homicidal, or self-harm ideations. The patient is presenting with  psychotic behaviors. During an encounter with the patient, he was not able to answer questions appropriately. Collateral information was not obtained from This provider try  reaching out to the patient guardian Ms. Damian LeavellVonde Thomas [phone]; [phone] and was unable to make contact with her Plan: The patient is a safety risk to self and others due to his aggression and does  require psychiatric inpatient admission for stabilization and treatment.  HPI:  Per Dr. Fanny BienQuale; Timothy [name] is a 85 y.o. male history of schizophrenia, seizures, TIAs, A. fib, stroke, and behavioral disturbance EMS reports the patient was at his group home, he became agitated had "tore up" his room and was acting very agitated and aggressive prompting police and EMS call.  Group home reported concerned that this was due to his mental illness and flashbacks from [place] with previous history of similar in the past.  Past Psychiatric History: Schizophrenia (HCC)  Risk to Self:  Yes Risk to Others:  Yes Prior Inpatient Therapy:  Yes Prior Outpatient Therapy:  Yes  Past Medical History:  Past Medical History:  Diagnosis Date  . Hypertension   . Schizophrenia (HCC)   . Seizures (HCC)   . TIA (transient ischemic attack)    History reviewed. No pertinent surgical history. Family History: History reviewed. No pertinent family history. Family Psychiatric  History: None indicated  Social History:  Social History   Substance and Sexual Activity  Alcohol Use No  . Alcohol/week: 0.0 standard drinks     Social History   Substance and Sexual Activity  Drug Use No    Social History   Socioeconomic History  . Marital status: Widowed    Spouse name: Not on file  . Number of children: Not on file  . Years of education: Not on file  . Highest education level: Not on file  Occupational History  . Not on file  Social Needs  .  Financial resource strain: Not on file  . Food insecurity    Worry: Not on file    Inability: Not on file  . Transportation needs    Medical: Not on file    Non-medical: Not on file  Tobacco Use  . Smoking status: Current Every Day Smoker    Packs/day:  0.25    Years: 25.00    Pack years: 6.25    Types: Cigarettes  . Smokeless tobacco: Never Used  Substance and Sexual Activity  . Alcohol use: No    Alcohol/week: 0.0 standard drinks  . Drug use: No  . Sexual activity: Not Currently  Lifestyle  . Physical activity    Days per week: Not on file    Minutes per session: Not on file  . Stress: Not on file  Relationships  . Social Musician on phone: Not on file    Gets together: Not on file    Attends religious service: Not on file    Active member of club or organization: Not on file    Attends meetings of clubs or organizations: Not on file    Relationship status: Not on file  Other Topics Concern  . Not on file  Social History Narrative  . Not on file   Additional Social History:    Allergies:  No Known Allergies  Labs:  Results for orders placed or performed during the hospital encounter of 08/21/18 (from the past 48 hour(s))  Comprehensive metabolic panel     Status: Abnormal   Collection Time: 08/21/18  5:26 PM  Result Value Ref Range   Sodium 142 135 - 145 mmol/L   Potassium 3.3 (L) 3.5 - 5.1 mmol/L   Chloride 104 98 - 111 mmol/L   CO2 26 22 - 32 mmol/L   Glucose, Bld 90 70 - 99 mg/dL   BUN 20 8 - 23 mg/dL   Creatinine, Ser 1.61 0.61 - 1.24 mg/dL   Calcium 9.8 8.9 - 09.6 mg/dL   Total Protein 8.0 6.5 - 8.1 g/dL   Albumin 4.3 3.5 - 5.0 g/dL   AST 29 15 - 41 U/L   ALT 24 0 - 44 U/L   Alkaline Phosphatase 69 38 - 126 U/L   Total Bilirubin 1.1 0.3 - 1.2 mg/dL   GFR calc non Af Amer >60 >60 mL/min   GFR calc Af Amer >60 >60 mL/min   Anion gap 12 5 - 15    Comment: Performed at [facility], [address] [place], [place] [address]  CBC with Diff     Status: None   Collection Time: 08/21/18  5:26 PM  Result Value Ref Range   WBC 4.5 4.0 - 10.5 K/uL   RBC 5.75 4.22 - 5.81 MIL/uL   Hemoglobin 15.7 13.0 - 17.0 g/dL   HCT 98.1 19.1 - 47.8 %   MCV 83.0 80.0 - 100.0 fL   MCH 27.3 26.0 -  34.0 pg   MCHC 32.9 30.0 - 36.0 g/dL   RDW 29.5 62.1 - 30.8 %   Platelets 159 150 - 400 K/uL   nRBC 0.0 0.0 - 0.2 %   Neutrophils Relative % 74 %   Neutro Abs 3.3 1.7 - 7.7 K/uL   Lymphocytes Relative 17 %   Lymphs Abs 0.7 0.7 - 4.0 K/uL   Monocytes Relative 9 %   Monocytes Absolute 0.4 0.1 - 1.0 K/uL   Eosinophils Relative 0 %   Eosinophils Absolute 0.0 0.0 -  0.5 K/uL   Basophils Relative 0 %   Basophils Absolute 0.0 0.0 - 0.1 K/uL   Immature Granulocytes 0 %   Abs Immature Granulocytes 0.02 0.00 - 0.07 K/uL    Comment: Performed at [facility], [address] [place], [place] [address]  Ethanol     Status: None   Collection Time: 08/21/18  5:26 PM  Result Value Ref Range   Alcohol, Ethyl (B) <10 <10 mg/dL    Comment: (NOTE) Lowest detectable limit for serum alcohol is 10 mg/dL. For medical purposes only. Performed at [facility], [address] [place], [place] [address]   SARS Coronavirus 2 (CEPHEID - Performed in [facility] hospital lab), Hosp Order     Status: None   Collection Time: 08/22/18 11:55 AM   Specimen: Nasopharyngeal Swab  Result Value Ref Range   SARS Coronavirus 2 NEGATIVE NEGATIVE    Comment: (NOTE) If result is NEGATIVE SARS-CoV-2 target nucleic acids are NOT DETECTED. The SARS-CoV-2 RNA is generally detectable in upper and lower  respiratory specimens during the acute phase of infection. The lowest  concentration of SARS-CoV-2 viral copies this assay can detect is 250  copies / mL. A negative result does not preclude SARS-CoV-2 infection  and should not be used as the sole basis for treatment or other  patient management decisions.  A negative result may occur with  improper specimen collection / handling, submission of specimen other  than nasopharyngeal swab, presence of viral mutation(s) within the  areas targeted by this assay, and inadequate number of viral copies  (<250 copies / mL). A negative result must be combined  with clinical  observations, patient history, and epidemiological information. If result is POSITIVE SARS-CoV-2 target nucleic acids are DETECTED. The SARS-CoV-2 RNA is generally detectable in upper and lower  respiratory specimens dur ing the acute phase of infection.  Positive  results are indicative of active infection with SARS-CoV-2.  Clinical  correlation with patient history and other diagnostic information is  necessary to determine patient infection status.  Positive results do  not rule out bacterial infection or co-infection with other viruses. If result is PRESUMPTIVE POSTIVE SARS-CoV-2 nucleic acids MAY BE PRESENT.   A presumptive positive result was obtained on the submitted specimen  and confirmed on repeat testing.  While 2019 novel coronavirus  (SARS-CoV-2) nucleic acids may be present in the submitted sample  additional confirmatory testing may be necessary for epidemiological  and / or clinical management purposes  to differentiate between  SARS-CoV-2 and other Sarbecovirus currently known to infect humans.  If clinically indicated additional testing with an alternate test  methodology [phone]([id number]) is advised. The SARS-CoV-2 RNA is generally  detectable in upper and lower respiratory sp ecimens during the acute  phase of infection. The expected result is Negative. Fact Sheet for Patients:  BoilerBrush.com.cyhttps://www.fda.gov/media/136312/download Fact Sheet for Healthcare Providers: https://pope.com/https://www.fda.gov/media/136313/download This test is not yet approved or cleared by the [place] FDA and has been authorized for detection and/or diagnosis of SARS-CoV-2 by FDA under an Emergency Use Authorization (EUA).  This EUA will remain in effect (meaning this test can be used) for the duration of the COVID-19 declaration under Section 564(b)(1) of the Act, 21 U.S.C. section 360bbb-3(b)(1), unless the authorization is terminated or revoked sooner. Performed at [facility], [address] [place], [place] [address]     Current Facility-Administered Medications  Medication Dose Route Frequency Provider Last Rate Last Dose  . acetaminophen (TYLENOL) tablet 650  mg  650 mg Oral Q8H PRN Loleta RoseForbach, Cory, MD      . amLODipine (NORVASC) tablet 10 mg  10 mg Oral Daily Loleta RoseForbach, Cory, MD   10 mg at 08/22/18 1122  . atorvastatin (LIPITOR) tablet 10 mg  10 mg Oral Daily Loleta RoseForbach, Cory, MD   10 mg at 08/22/18 1123  . lamoTRIgine (LAMICTAL) tablet 150 mg  150 mg Oral BID Loleta RoseForbach, Cory, MD   150 mg at 08/23/18 0215  . memantine (NAMENDA) tablet 5 mg  5 mg Oral Daily Loleta RoseForbach, Cory, MD   5 mg at 08/22/18 1122  . OLANZapine (ZYPREXA) tablet 20 mg  20 mg Oral QHS Loleta RoseForbach, Cory, MD   20 mg at 08/23/18 0215  . vitamin B-12 (CYANOCOBALAMIN) tablet 1,000 mcg  1,000 mcg Oral Daily Loleta RoseForbach, Cory, MD   1,000 mcg at 08/22/18 1124   Current Outpatient Medications  Medication Sig Dispense Refill  . acetaminophen (TYLENOL) 325 MG tablet Take 2 tablets (650 mg total) by mouth 2 (two) times daily. As needed (Patient taking differently: Take 650 mg by mouth 2 (two) times daily as needed. As needed) 60 tablet 5  . amLODipine (NORVASC) 10 MG tablet Take 1 tablet (10 mg total) by mouth daily. 30 tablet 5  . atorvastatin (LIPITOR) 10 MG tablet TAKE 1 TABLET BY MOUTH DAILY. (Patient taking differently: Take 10 mg by mouth every evening. ) 30 tablet 0  . lamoTRIgine (LAMICTAL) 150 MG tablet Take 150 mg by mouth 2 (two) times daily.    Marland Kitchen. MAPAP ARTHRITIS PAIN 650 MG CR tablet TAKE 1 TABLET BY MOUTH 2 TIMES DAILY. AS NEEDED (Patient taking differently: Take 650 mg by mouth 2 (two) times daily. ) 60 tablet 0  . memantine (NAMENDA) 5 MG tablet TAKE 1 TABLET BY MOUTH DAILY (Patient taking differently: Take 5 mg by mouth daily. ) 30 tablet 0  . OLANZapine (ZYPREXA) 10 MG tablet Take 20 mg by mouth at bedtime.     . vitamin B-12 (CYANOCOBALAMIN) 1000 MCG tablet TAKE 1 TABLET BY MOUTH DAILY. (Patient taking  differently: Take 1,000 mcg by mouth daily. ) 30 tablet 0    Musculoskeletal: Strength & Muscle Tone: within normal limits Gait & Station: unsteady Patient leans: Backward  Psychiatric Specialty Exam: Physical Exam  Nursing note and vitals reviewed. Constitutional: He appears well-developed and well-nourished. No distress.  HENT:  Head: Normocephalic and atraumatic.  Eyes: Pupils are equal, round, and reactive to light.  Neck: Normal range of motion.  Cardiovascular: Normal rate and regular rhythm.  Respiratory: Effort normal. No respiratory distress.  Musculoskeletal: Normal range of motion.  Neurological: He is alert.    Review of Systems  Unable to perform ROS: Psychiatric disorder  Psychiatric/Behavioral: Positive for memory loss. The patient has insomnia.     Blood pressure 119/73, pulse 89, temperature 97.8 F (36.6 C), temperature source Axillary, resp. rate 17, height 5\' 4"  (1.626 m), weight 77.1 kg, SpO2 99 %.Body mass index is 29.18 kg/m.  General Appearance: Bizarre  Eye Contact:  None  Speech:  Garbled  Volume:  Decreased  Mood:  NA  Affect:  Constricted  Thought Process:  Disorganized  Orientation:  Other:  Self  Thought Content:  Illogical  Suicidal Thoughts:  Unable to assess  Homicidal Thoughts:  Unable to assess  Memory:  Unable to assess  Judgement:  Poor  Insight:  Lacking  Psychomotor Activity:  Decreased and Restlessness  Concentration:  Concentration: Poor and Attention Span: Poor  Recall:  Poor  Fund of Knowledge:  Unable to assess  Language:  Poor  Akathisia:  NA  Handed:  Right  AIMS (if indicated):     Assets:  Physical Health Social Support  ADL's:  Impaired  Cognition:  Impaired,  Severe  Sleep:   Patient does not state     Treatment Plan Summary: Continue involuntary commitment Daily contact with patient to assess and evaluate symptoms and progress in treatment and Medication management  Restart home medications, and continue  to monitor for patient safety. EKG, chest x-ray, UA and COVID screening completed for consideration for admission to [facility] geriatric psychiatry.  Disposition: Supportive therapy provided about ongoing stressors. Patient does meet criteria for geriatric psychiatric inpatient admission for stabilization and treatment.  Lavella Hammock, MD 08/23/2018 11:08 AM

## 2018-08-23 NOTE — ED Notes (Signed)
During rounding on patient, noticed dinner tray and ensure on table. Fed patient small portion of mashed potatoes and [place]. Pt stated did not like it. Patient drank ensure and ate all of the applesauce.AS

## 2018-08-23 NOTE — ED Notes (Signed)
Report given to Jennifer RN

## 2018-08-23 NOTE — ED Notes (Signed)
Pt too asleep to take meds - edp aware and to bedside. edp ok to hold meds until safe to give to pt.

## 2018-08-23 NOTE — ED Notes (Signed)
Pt still asleep. Able to wake up and answer yes and no questions but falls immediately back to sleep when you stop stimulating him. NAD. VSS.

## 2018-08-23 NOTE — ED Notes (Signed)
IVC/Being review by [facility] no decision yet

## 2018-08-23 NOTE — ED Notes (Signed)
During rounding gave patient half of chocolate ensure. Change scrub top with assistance form nurse Sonja.AS

## 2018-08-24 DIAGNOSIS — R4182 Altered mental status, unspecified: Secondary | ICD-10-CM | POA: Diagnosis not present

## 2018-08-24 DIAGNOSIS — F0391 Unspecified dementia with behavioral disturbance: Secondary | ICD-10-CM | POA: Diagnosis not present

## 2018-08-24 DIAGNOSIS — F203 Undifferentiated schizophrenia: Secondary | ICD-10-CM | POA: Diagnosis not present

## 2018-08-24 LAB — URINALYSIS, COMPLETE (UACMP) WITH MICROSCOPIC
Bilirubin Urine: NEGATIVE
Glucose, UA: NEGATIVE mg/dL
Hgb urine dipstick: NEGATIVE
Ketones, ur: 20 mg/dL — AB
Leukocytes,Ua: NEGATIVE
Nitrite: NEGATIVE
Protein, ur: 30 mg/dL — AB
Specific Gravity, Urine: 1.027 (ref 1.005–1.030)
pH: 5 (ref 5.0–8.0)

## 2018-08-24 LAB — GLUCOSE, CAPILLARY: Glucose-Capillary: 125 mg/dL — ABNORMAL HIGH (ref 70–99)

## 2018-08-24 MED ORDER — SODIUM CHLORIDE 0.9 % IV BOLUS
1000.0000 mL | Freq: Once | INTRAVENOUS | Status: AC
  Administered 2018-08-24: 1000 mL via INTRAVENOUS

## 2018-08-24 MED ORDER — SODIUM CHLORIDE 0.9 % IV SOLN
1.0000 g | Freq: Once | INTRAVENOUS | Status: AC
  Administered 2018-08-24: 1 g via INTRAVENOUS
  Filled 2018-08-24: qty 10

## 2018-08-24 MED ORDER — CEPHALEXIN 500 MG PO CAPS
500.0000 mg | ORAL_CAPSULE | Freq: Three times a day (TID) | ORAL | Status: DC
  Administered 2018-08-24 – 2018-08-26 (×6): 500 mg via ORAL
  Filled 2018-08-24 (×7): qty 1

## 2018-08-24 MED ORDER — LORAZEPAM 1 MG PO TABS
1.0000 mg | ORAL_TABLET | Freq: Once | ORAL | Status: AC
  Administered 2018-08-24: 1 mg via ORAL
  Filled 2018-08-24: qty 1

## 2018-08-24 NOTE — ED Provider Notes (Addendum)
Reviewed patient's record.  He is currently awaiting Geri psych placement.  Of note, urinalysis was ordered but has not returned yet.  I obtained a urinalysis which did show mild dehydration.  Has some possible pyuria, that sample is a potential contaminant.  Will send a culture.  Of note, he already has an IV, so was given a dose of Rocephin and a liter of fluids.  He seems to be improving and is eating and drinking now.  Will continue [facility] psych placement.  Do not feel he needs admission from an infectious perspective at this time.  Would recommend: -Keflex 500 mg TID x 7 days, f/u UCx sent in ED   Duffy Bruce, MD 08/24/18 1129    Duffy Bruce, MD 08/24/18 1139

## 2018-08-24 NOTE — BH Assessment (Signed)
Patient has been accepted to [facility].  Patient assigned to [facility]   Accepting physician is Dr. Alonna Minium.  Call report to [phone].  Representative was [employer].   ER Staff is aware of it:  Lattie Haw, ER Secretary  Dr. Ellender Hose, ER MD  Jinny Blossom, Patient's Nurse     Patient's Guardian Derl Barrow Thomas-[phone]) have been updated as well.   Address: [address]  [place], [place] [address]   Bed pending their discharges. [facility] will call back when bed is ready.

## 2018-08-24 NOTE — ED Provider Notes (Signed)
-----------------------------------------   6:24 AM on 08/24/2018 -----------------------------------------   Blood pressure 119/70, pulse 62, temperature 97.8 F (36.6 C), temperature source Axillary, resp. rate 18, height 5\' 4"  (1.626 m), weight 77.1 kg, SpO2 97 %.  The patient is sleeping at this time.  There have been no acute events since the last update.  Awaiting disposition plan from Behavioral Medicine team.   Paulette Blanch, MD 08/24/18 [phone]

## 2018-08-24 NOTE — ED Notes (Signed)
This RN and Cheral Bay, PT at bedside, pt refusing to get out of bed stating, "I don't feel good, I have the flu". This RN offered ice cream and a chair for patient to get out of bed without success. Pt states, "I can't get up, I'm too sore from earlier". Lights turned back down for patient comfort. Will continue to monitor for further patient needs.
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
## 2018-08-24 NOTE — BH Assessment (Signed)
Writer spoke with [facility] ([phone]). The patient bed will not be available today (08/24/2018) due to their patients discharge getting canceled. [facility] will call back with updates.  Writer informed ER Network engineer Lattie Haw).

## 2018-08-24 NOTE — ED Notes (Signed)
Pt placed on left side by this RN. Pt brief changed at this time. Pt denies any needs. This RN will continue to monitor.

## 2018-08-24 NOTE — ED Notes (Signed)
Pt turned to right side by this RN and Catalina Antigua, Therapist, sports. Pt denies any needs at this time. This RN will continue to monitor.

## 2018-08-24 NOTE — ED Notes (Signed)
Pt has a condom cath in place, draining well

## 2018-08-24 NOTE — ED Notes (Signed)
Timothy [name], TTS notified that patient's UA back, per notes from [place], [facility] UA needed to be faxed for [facility] to review for placement.

## 2018-08-24 NOTE — ED Notes (Signed)
Pt sat up in bed, given medication with mushed peaches. Pt ate full serving of peaches. Pt to be left sitting up in adjusted position for time being. Will continue to monitor.

## 2018-08-24 NOTE — ED Notes (Signed)
This RN to bedside, pt resting in bed with eyes closed. Pt awakens easily with verbal stimuli. Will continue to monitor for further patient needs.

## 2018-08-24 NOTE — ED Notes (Signed)
Pt resting in bed, requests the lights stay on. Will continue to monitor for further patient needs.

## 2018-08-24 NOTE — ED Notes (Signed)
During bathing, pt noted to have infiltrated IV, MD made aware, IV removed.

## 2018-08-24 NOTE — ED Notes (Signed)
PT present to evaluate pt

## 2018-08-24 NOTE — ED Notes (Signed)
Meal tray placed at bedside by ED tech Solmon Ice

## 2018-08-24 NOTE — ED Notes (Signed)
This RN to bedside, pt awakens easily with verbal stimuli. Pt sat up in bed, fed small amounts and given some milk to drink. Pt able to take PO medications, some coughing noted at this time. Pt repositioned in bed by this RN and Solmon Ice, EDT due to this RN noticing patient had not appeared to move in several hours. Pt appeared to have difficulty moving. This RN discussed concerns with MD, VORB for PT consult pending placement to geripsych facility. Pt awakens with verbal stimuli, however is noted to be confused.

## 2018-08-24 NOTE — ED Notes (Signed)
Pt drank approx 1/2 container of milk at this time. Ate 1 container apple sauce, 1/2 container of magic dessert. Pt refused to eat more. Pt repositioned in bed at this time.

## 2018-08-24 NOTE — Consult Note (Addendum)
[facility]   Reason for Consult: Altered mental status Referring Physician: Dr. Fanny BienQuale Patient Identification: Timothy [name] MRN:  [id number] Principal Diagnosis: Schizophrenia Diagnosis: Schizophrenia  Total Time spent with patient: 15 minutes   Patient is seen, chart is reviewed.  Patient is calm on assessment, difficult to comprehend what the patient is saying.    Per initial psychiatric intake from nurse practitioner overnight: Subjective: Patient does not say much but No during his bath.  Eating when fed, minimal movement in bed.  History of catatonia, confounded with dementia and living in a skilled nursing facility for a few years. Timothy [name] is a 85 y.o. male patient [facility]. Per EMS discussion with triage nurse the patient lives in a group home and has a history of PTSD and at times tends to experience flashbacks that usually precipitate into these events.  This provider try reaching out to the patient guardian Ms. Damian LeavellVonde Thomas [phone]; [phone] and was unable to make contact with her  The patient was seen face-to-face by this provider; chart reviewed and consulted with Dr. Fanny BienQuale on 08/21/2018 due to the care of the patient. It was discussed with the provider that the patient does meet criteria to be admitted to the a geriatric psychiatric inpatient unit for treatment and stabilization. On evaluation the patient is alert to self only, uncooperative, and mood-congruent with affect.  The patient is exhibiting internal stimuli and presenting with some hallucination and delusional thinking. This provider is unable to assess if the patient is experiencing visual hallucinations, suicidal, homicidal, or self-harm ideations. The patient is presenting with  psychotic behaviors. During an encounter with the patient, he was not able to answer questions appropriately. Collateral information was not obtained from This provider try reaching out  to the patient guardian Ms. Damian LeavellVonde Thomas [phone]; [phone] and was unable to make contact with her Plan: The patient is a safety risk to self and others due to his aggression and does  require psychiatric inpatient admission for stabilization and treatment.  HPI:  Per Dr. Fanny BienQuale; Timothy [name] is a 85 y.o. male history of schizophrenia, seizures, TIAs, A. fib, stroke, and behavioral disturbance EMS reports the patient was at his group home, he became agitated had "tore up" his room and was acting very agitated and aggressive prompting police and EMS call.  Group home reported concerned that this was due to his mental illness and flashbacks from [place] with previous history of similar in the past.  Past Psychiatric History: Schizophrenia (HCC)  Risk to Self:  Yes Risk to Others:  Yes Prior Inpatient Therapy:  Yes Prior Outpatient Therapy:  Yes  Past Medical History:  Past Medical History:  Diagnosis Date  . Hypertension   . Schizophrenia (HCC)   . Seizures (HCC)   . TIA (transient ischemic attack)    History reviewed. No pertinent surgical history. Family History: History reviewed. No pertinent family history. Family Psychiatric  History: None indicated  Social History:  Social History   Substance and Sexual Activity  Alcohol Use No  . Alcohol/week: 0.0 standard drinks     Social History   Substance and Sexual Activity  Drug Use No    Social History   Socioeconomic History  . Marital status: Widowed    Spouse name: Not on file  . Number of children: Not on file  . Years of education: Not on file  . Highest education level: Not on file  Occupational History  . Not on  file  Social Needs  . Financial resource strain: Not on file  . Food insecurity    Worry: Not on file    Inability: Not on file  . Transportation needs    Medical: Not on file    Non-medical: Not on file  Tobacco Use  . Smoking status: Current Every Day Smoker    Packs/day: 0.25    Years:  25.00    Pack years: 6.25    Types: Cigarettes  . Smokeless tobacco: Never Used  Substance and Sexual Activity  . Alcohol use: No    Alcohol/week: 0.0 standard drinks  . Drug use: No  . Sexual activity: Not Currently  Lifestyle  . Physical activity    Days per week: Not on file    Minutes per session: Not on file  . Stress: Not on file  Relationships  . Social Musicianconnections    Talks on phone: Not on file    Gets together: Not on file    Attends religious service: Not on file    Active member of club or organization: Not on file    Attends meetings of clubs or organizations: Not on file    Relationship status: Not on file  Other Topics Concern  . Not on file  Social History Narrative  . Not on file   Additional Social History:    Allergies:  No Known Allergies  Labs:  Results for orders placed or performed during the hospital encounter of 08/21/18 (from the past 48 hour(s))  SARS Coronavirus 2 (CEPHEID - Performed in [facility] hospital lab), Hosp Order     Status: None   Collection Time: 08/22/18 11:55 AM   Specimen: Nasopharyngeal Swab  Result Value Ref Range   SARS Coronavirus 2 NEGATIVE NEGATIVE    Comment: (NOTE) If result is NEGATIVE SARS-CoV-2 target nucleic acids are NOT DETECTED. The SARS-CoV-2 RNA is generally detectable in upper and lower  respiratory specimens during the acute phase of infection. The lowest  concentration of SARS-CoV-2 viral copies this assay can detect is 250  copies / mL. A negative result does not preclude SARS-CoV-2 infection  and should not be used as the sole basis for treatment or other  patient management decisions.  A negative result may occur with  improper specimen collection / handling, submission of specimen other  than nasopharyngeal swab, presence of viral mutation(s) within the  areas targeted by this assay, and inadequate number of viral copies  (<250 copies / mL). A negative result must be combined with clinical   observations, patient history, and epidemiological information. If result is POSITIVE SARS-CoV-2 target nucleic acids are DETECTED. The SARS-CoV-2 RNA is generally detectable in upper and lower  respiratory specimens dur ing the acute phase of infection.  Positive  results are indicative of active infection with SARS-CoV-2.  Clinical  correlation with patient history and other diagnostic information is  necessary to determine patient infection status.  Positive results do  not rule out bacterial infection or co-infection with other viruses. If result is PRESUMPTIVE POSTIVE SARS-CoV-2 nucleic acids MAY BE PRESENT.   A presumptive positive result was obtained on the submitted specimen  and confirmed on repeat testing.  While 2019 novel coronavirus  (SARS-CoV-2) nucleic acids may be present in the submitted sample  additional confirmatory testing may be necessary for epidemiological  and / or clinical management purposes  to differentiate between  SARS-CoV-2 and other Sarbecovirus currently known to infect humans.  If clinically indicated additional testing with  an alternate test  methodology [phone]([id number]) is advised. The SARS-CoV-2 RNA is generally  detectable in upper and lower respiratory sp ecimens during the acute  phase of infection. The expected result is Negative. Fact Sheet for Patients:  BoilerBrush.com.cyhttps://www.fda.gov/media/136312/download Fact Sheet for Healthcare Providers: https://pope.com/https://www.fda.gov/media/136313/download This test is not yet approved or cleared by the [place] FDA and has been authorized for detection and/or diagnosis of SARS-CoV-2 by FDA under an Emergency Use Authorization (EUA).  This EUA will remain in effect (meaning this test can be used) for the duration of the COVID-19 declaration under Section 564(b)(1) of the Act, 21 U.S.C. section 360bbb-3(b)(1), unless the authorization is terminated or revoked sooner. Performed at [facility], [address] [place], [place] [address]   Urinalysis, Complete w Microscopic     Status: Abnormal   Collection Time: 08/24/18  7:15 AM  Result Value Ref Range   Color, Urine AMBER (A) YELLOW    Comment: BIOCHEMICALS MAY BE AFFECTED BY COLOR   APPearance HAZY (A) CLEAR   Specific Gravity, Urine 1.027 1.005 - 1.030   pH 5.0 5.0 - 8.0   Glucose, UA NEGATIVE NEGATIVE mg/dL   Hgb urine dipstick NEGATIVE NEGATIVE   Bilirubin Urine NEGATIVE NEGATIVE   Ketones, ur 20 (A) NEGATIVE mg/dL   Protein, ur 30 (A) NEGATIVE mg/dL   Nitrite NEGATIVE NEGATIVE   Leukocytes,Ua NEGATIVE NEGATIVE   RBC / HPF 0-5 0 - 5 RBC/hpf   WBC, UA 6-10 0 - 5 WBC/hpf   Bacteria, UA RARE (A) NONE SEEN   Squamous Epithelial / LPF 11-20 0 - 5   Mucus PRESENT     Comment: Performed at [facility], [address] [place], [place] [address]    Current Facility-Administered Medications  Medication Dose Route Frequency Provider Last Rate Last Dose  . acetaminophen (TYLENOL) tablet 650 mg  650 mg Oral Q8H PRN Loleta RoseForbach, Cory, MD      . amLODipine (NORVASC) tablet 10 mg  10 mg Oral Daily Loleta RoseForbach, Cory, MD   10 mg at 08/24/18 1112  . atorvastatin (LIPITOR) tablet 10 mg  10 mg Oral Daily Loleta RoseForbach, Cory, MD   10 mg at 08/24/18 1112  . lamoTRIgine (LAMICTAL) tablet 150 mg  150 mg Oral BID Loleta RoseForbach, Cory, MD   150 mg at 08/24/18 1112  . memantine (NAMENDA) tablet 5 mg  5 mg Oral Daily Loleta RoseForbach, Cory, MD   5 mg at 08/24/18 1113  . OLANZapine (ZYPREXA) tablet 20 mg  20 mg Oral QHS Loleta RoseForbach, Cory, MD   20 mg at 08/23/18 0215  . vitamin B-12 (CYANOCOBALAMIN) tablet 1,000 mcg  1,000 mcg Oral Daily Loleta RoseForbach, Cory, MD   1,000 mcg at 08/24/18 1113   Current Outpatient Medications  Medication Sig Dispense Refill  . acetaminophen (TYLENOL) 325 MG tablet Take 2 tablets (650 mg total) by mouth 2 (two) times daily. As needed (Patient taking differently: Take 650 mg by mouth 2 (two) times daily as needed. As needed) 60 tablet 5  . amLODipine  (NORVASC) 10 MG tablet Take 1 tablet (10 mg total) by mouth daily. 30 tablet 5  . atorvastatin (LIPITOR) 10 MG tablet TAKE 1 TABLET BY MOUTH DAILY. (Patient taking differently: Take 10 mg by mouth every evening. ) 30 tablet 0  . lamoTRIgine (LAMICTAL) 150 MG tablet Take 150 mg by mouth 2 (two) times daily.    Marland Kitchen. MAPAP ARTHRITIS PAIN 650 MG CR tablet TAKE 1 TABLET BY MOUTH 2 TIMES DAILY. AS NEEDED (Patient  taking differently: Take 650 mg by mouth 2 (two) times daily. ) 60 tablet 0  . memantine (NAMENDA) 5 MG tablet TAKE 1 TABLET BY MOUTH DAILY (Patient taking differently: Take 5 mg by mouth daily. ) 30 tablet 0  . OLANZapine (ZYPREXA) 10 MG tablet Take 20 mg by mouth at bedtime.     . vitamin B-12 (CYANOCOBALAMIN) 1000 MCG tablet TAKE 1 TABLET BY MOUTH DAILY. (Patient taking differently: Take 1,000 mcg by mouth daily. ) 30 tablet 0    Musculoskeletal: Strength & Muscle Tone: within normal limits Gait & Station: unsteady Patient leans: Backward  Psychiatric Specialty Exam: Physical Exam  Nursing note and vitals reviewed. Constitutional: He appears well-developed and well-nourished. No distress.  HENT:  Head: Normocephalic and atraumatic.  Eyes: Pupils are equal, round, and reactive to light.  Neck: Normal range of motion.  Cardiovascular: Normal rate and regular rhythm.  Respiratory: Effort normal. No respiratory distress.  Musculoskeletal: Normal range of motion.  Neurological: He is alert.  Psychiatric: His speech is normal and behavior is normal. Thought content normal. His mood appears anxious. His affect is blunt. Cognition and memory are impaired. He expresses impulsivity.    Review of Systems  Unable to perform ROS: Psychiatric disorder  Psychiatric/Behavioral: Positive for memory loss. The patient has insomnia.     Blood pressure 138/74, pulse 81, temperature 97.8 F (36.6 C), temperature source Axillary, resp. rate 18, height 5\' 4"  (1.626 m), weight 77.1 kg, SpO2 96 %.Body mass  index is 29.18 kg/m.  General Appearance: Bizarre  Eye Contact:  None  Speech:  Garbled  Volume:  Decreased  Mood:  NA  Affect:  Constricted  Thought Process:  Disorganized  Orientation:  Other:  Self  Thought Content:  Illogical  Suicidal Thoughts:  Unable to assess  Homicidal Thoughts:  Unable to assess  Memory:  Unable to assess  Judgement:  Poor  Insight:  Lacking  Psychomotor Activity:  Decreased and Restlessness  Concentration:  Concentration: Poor and Attention Span: Poor  Recall:  Poor  Fund of Knowledge:  Unable to assess  Language:  Poor  Akathisia:  NA  Handed:  Right  AIMS (if indicated):     Assets:  Physical Health Social Support  ADL's:  Impaired  Cognition:  Impaired,  Severe  Sleep:   Patient does not state     Treatment Plan Summary: Continue involuntary commitment Daily contact with patient to assess and evaluate symptoms and progress in treatment and Medication management  Dementia with behavioral disturbances: -Continued Namenda 5 mg daily -Seeking geriatric psych  Schizophrenia: -Continued Zyprexa 20 mg at bedtime -Continued Lamictal 150 mg BID  Disposition: Supportive therapy provided about ongoing stressors. Patient does meet criteria for geriatric psychiatric inpatient admission for stabilization and treatment.  Waylan Boga, NP 08/24/2018 11:17 AM

## 2018-08-25 ENCOUNTER — Emergency Department: Payer: Medicare Other

## 2018-08-25 DIAGNOSIS — F0391 Unspecified dementia with behavioral disturbance: Secondary | ICD-10-CM | POA: Diagnosis not present

## 2018-08-25 DIAGNOSIS — F203 Undifferentiated schizophrenia: Secondary | ICD-10-CM | POA: Diagnosis not present

## 2018-08-25 LAB — CBC
HCT: 46.9 % (ref 39.0–52.0)
Hemoglobin: 15.4 g/dL (ref 13.0–17.0)
MCH: 27.4 pg (ref 26.0–34.0)
MCHC: 32.8 g/dL (ref 30.0–36.0)
MCV: 83.5 fL (ref 80.0–100.0)
Platelets: 175 10*3/uL (ref 150–400)
RBC: 5.62 MIL/uL (ref 4.22–5.81)
RDW: 13.9 % (ref 11.5–15.5)
WBC: 9.1 10*3/uL (ref 4.0–10.5)
nRBC: 0 % (ref 0.0–0.2)

## 2018-08-25 LAB — URINE CULTURE: Culture: <10000 — AB

## 2018-08-25 LAB — BASIC METABOLIC PANEL WITH GFR
Anion gap: 9 (ref 5–15)
BUN: 18 mg/dL (ref 8–23)
CO2: 29 mmol/L (ref 22–32)
Calcium: 9.2 mg/dL (ref 8.9–10.3)
Chloride: 101 mmol/L (ref 98–111)
Creatinine, Ser: 1.16 mg/dL (ref 0.61–1.24)
GFR calc Af Amer: >60 mL/min
GFR calc non Af Amer: 58 mL/min — ABNORMAL LOW
Glucose, Bld: 213 mg/dL — ABNORMAL HIGH (ref 70–99)
Potassium: 3.6 mmol/L (ref 3.5–5.1)
Sodium: 139 mmol/L (ref 135–145)

## 2018-08-25 LAB — GLUCOSE, CAPILLARY: Glucose-Capillary: 113 mg/dL — ABNORMAL HIGH (ref 70–99)

## 2018-08-25 NOTE — ED Notes (Signed)
Called pharmacy to send keflex d/t pyxis out

## 2018-08-25 NOTE — ED Notes (Signed)
Val RN at bedside to feed pt

## 2018-08-25 NOTE — ED Notes (Signed)
Oral care provided 

## 2018-08-25 NOTE — Consult Note (Signed)
[place] [place] [facility] Consult   Reason for Consult: Altered mental status Referring Physician: Dr. Jacqualine Code Patient Identification: Timothy [name] MRN:  [id number] Principal Diagnosis: Schizophrenia Diagnosis: Schizophrenia  Total Time spent with patient: 15 minutes   Patient is seen, chart is reviewed.  Patient is mildly anxious on assessment, easier to understand today.  He reports ankle pain, repositioned for comfort.  EDP ordered a chest xray after the RN reported he was coughing every time after drinking and a marked change in cognition from admission.  Negative chest xray.  Speech therapy consult placed.  Patient continues on Keflex.    Per initial psychiatric intake from nurse practitioner overnight: Subjective: Patient does not say much but No during his bath.  Eating when fed, minimal movement in bed.  History of catatonia, confounded with dementia and living in a skilled nursing facility for a few years. Timothy [name] is a 85 y.o. male patient [facility] ED via [facility]. Per EMS discussion with triage nurse the patient lives in a group home and has a history of PTSD and at times tends to experience flashbacks that usually precipitate into these events.  This provider try reaching out to the patient guardian Ms. Merrie Roof [phone]; [phone] and was unable to make contact with her  The patient was seen face-to-face by this provider; chart reviewed and consulted with Dr. Jacqualine Code on 08/21/2018 due to the care of the patient. It was discussed with the provider that the patient does meet criteria to be admitted to the a geriatric psychiatric inpatient unit for treatment and stabilization. On evaluation the patient is alert to self only, uncooperative, and mood-congruent with affect.  The patient is exhibiting internal stimuli and presenting with some hallucination and delusional thinking. This provider is unable to assess if the patient is experiencing visual hallucinations,  suicidal, homicidal, or self-harm ideations. The patient is presenting with  psychotic behaviors. During an encounter with the patient, he was not able to answer questions appropriately. Collateral information was not obtained from This provider try reaching out to the patient guardian Ms. Merrie Roof [phone]; [phone] and was unable to make contact with her Plan: The patient is a safety risk to self and others due to his aggression and does  require psychiatric inpatient admission for stabilization and treatment.  HPI:  Per Dr. Jacqualine Code; Timothy [name] is a 85 y.o. male history of schizophrenia, seizures, TIAs, A. fib, stroke, and behavioral disturbance EMS reports the patient was at his group home, he became agitated had "tore up" his room and was acting very agitated and aggressive prompting police and EMS call.  Group home reported concerned that this was due to his mental illness and flashbacks from [place] with previous history of similar in the past.  Past Psychiatric History: Schizophrenia ([place])  Risk to Self:  Yes Risk to Others:  Yes Prior Inpatient Therapy:  Yes Prior Outpatient Therapy:  Yes  Past Medical History:  Past Medical History:  Diagnosis Date  . Hypertension   . Schizophrenia ([place])   . Seizures ([place])   . TIA (transient ischemic attack)    History reviewed. No pertinent surgical history. Family History: History reviewed. No pertinent family history. Family Psychiatric  History: None indicated  Social History:  Social History   Substance and Sexual Activity  Alcohol Use No  . Alcohol/week: 0.0 standard drinks     Social History   Substance and Sexual Activity  Drug Use No    Social History   Socioeconomic History  .  Marital status: Widowed    Spouse name: Not on file  . Number of children: Not on file  . Years of education: Not on file  . Highest education level: Not on file  Occupational History  . Not on file  Social Needs  . Financial  resource strain: Not on file  . Food insecurity    Worry: Not on file    Inability: Not on file  . Transportation needs    Medical: Not on file    Non-medical: Not on file  Tobacco Use  . Smoking status: Current Every Day Smoker    Packs/day: 0.25    Years: 25.00    Pack years: 6.25    Types: Cigarettes  . Smokeless tobacco: Never Used  Substance and Sexual Activity  . Alcohol use: No    Alcohol/week: 0.0 standard drinks  . Drug use: No  . Sexual activity: Not Currently  Lifestyle  . Physical activity    Days per week: Not on file    Minutes per session: Not on file  . Stress: Not on file  Relationships  . Social Musicianconnections    Talks on phone: Not on file    Gets together: Not on file    Attends religious service: Not on file    Active member of club or organization: Not on file    Attends meetings of clubs or organizations: Not on file    Relationship status: Not on file  Other Topics Concern  . Not on file  Social History Narrative  . Not on file   Additional Social History:    Allergies:  No Known Allergies  Labs:  Results for orders placed or performed during the hospital encounter of 08/21/18 (from the past 48 hour(s))  Urinalysis, Complete w Microscopic     Status: Abnormal   Collection Time: 08/24/18  7:15 AM  Result Value Ref Range   Color, Urine AMBER (A) YELLOW    Comment: BIOCHEMICALS MAY BE AFFECTED BY COLOR   APPearance HAZY (A) CLEAR   Specific Gravity, Urine 1.027 1.005 - 1.030   pH 5.0 5.0 - 8.0   Glucose, UA NEGATIVE NEGATIVE mg/dL   Hgb urine dipstick NEGATIVE NEGATIVE   Bilirubin Urine NEGATIVE NEGATIVE   Ketones, ur 20 (A) NEGATIVE mg/dL   Protein, ur 30 (A) NEGATIVE mg/dL   Nitrite NEGATIVE NEGATIVE   Leukocytes,Ua NEGATIVE NEGATIVE   RBC / HPF 0-5 0 - 5 RBC/hpf   WBC, UA 6-10 0 - 5 WBC/hpf   Bacteria, UA RARE (A) NONE SEEN   Squamous Epithelial / LPF 11-20 0 - 5   Mucus PRESENT     Comment: Performed at [facility], [address] [place], [place] [address]  Urine culture     Status: Abnormal   Collection Time: 08/24/18  7:15 AM   Specimen: Urine, Random  Result Value Ref Range   Specimen Description      URINE, RANDOM Performed at [facility], [address] [place], [place] [address]    Special Requests      NONE Performed at [facility], [address] [place], [place] [address]    Culture (A)     <10,000 COLONIES/mL INSIGNIFICANT GROWTH Performed at [facility], [address] [place], [place] [address]    Report Status 08/25/2018 FINAL   Glucose, capillary     Status: Abnormal   Collection Time: 08/24/18  1:46 PM  Result Value Ref Range  Glucose-Capillary 125 (H) 70 - 99 mg/dL  Glucose, capillary     Status: Abnormal   Collection Time: 08/25/18  8:38 AM  Result Value Ref Range   Glucose-Capillary 113 (H) 70 - 99 mg/dL   Comment 1 Notify RN    Comment 2 Document in Chart   Basic metabolic panel     Status: Abnormal   Collection Time: 08/25/18  9:46 AM  Result Value Ref Range   Sodium 139 135 - 145 mmol/L   Potassium 3.6 3.5 - 5.1 mmol/L   Chloride 101 98 - 111 mmol/L   CO2 29 22 - 32 mmol/L   Glucose, Bld 213 (H) 70 - 99 mg/dL   BUN 18 8 - 23 mg/dL   Creatinine, Ser 1.611.16 0.61 - 1.24 mg/dL   Calcium 9.2 8.9 - 09.610.3 mg/dL   GFR calc non Af Amer 58 (L) >60 mL/min   GFR calc Af Amer >60 >60 mL/min   Anion gap 9 5 - 15    Comment: Performed at [facility], [address] [place], [place] [address]  CBC     Status: None   Collection Time: 08/25/18  9:46 AM  Result Value Ref Range   WBC 9.1 4.0 - 10.5 K/uL   RBC 5.62 4.22 - 5.81 MIL/uL   Hemoglobin 15.4 13.0 - 17.0 g/dL   HCT 98.146.9 19.139.0 - 47.852.0 %   MCV 83.5 80.0 - 100.0 fL   MCH 27.4 26.0 - 34.0 pg   MCHC 32.8 30.0 - 36.0 g/dL   RDW 29.513.9 62.111.5 - 30.815.5 %   Platelets 175 150 - 400 K/uL   nRBC 0.0 0.0 - 0.2 %    Comment: Performed at [facility], [address] [place],  [place] [address]    Current Facility-Administered Medications  Medication Dose Route Frequency Provider Last Rate Last Dose  . acetaminophen (TYLENOL) tablet 650 mg  650 mg Oral Q8H PRN Loleta RoseForbach, Cory, MD      . amLODipine (NORVASC) tablet 10 mg  10 mg Oral Daily Loleta RoseForbach, Cory, MD   10 mg at 08/25/18 1054  . atorvastatin (LIPITOR) tablet 10 mg  10 mg Oral Daily Loleta RoseForbach, Cory, MD   10 mg at 08/25/18 1057  . cephALEXin (KEFLEX) capsule 500 mg  500 mg Oral Q8H Shaune PollackIsaacs, Cameron, MD   500 mg at 08/25/18 0616  . lamoTRIgine (LAMICTAL) tablet 150 mg  150 mg Oral BID Loleta RoseForbach, Cory, MD   150 mg at 08/25/18 1050  . memantine (NAMENDA) tablet 5 mg  5 mg Oral Daily Loleta RoseForbach, Cory, MD   5 mg at 08/25/18 1054  . OLANZapine (ZYPREXA) tablet 20 mg  20 mg Oral QHS Loleta RoseForbach, Cory, MD   20 mg at 08/24/18 2228  . vitamin B-12 (CYANOCOBALAMIN) tablet 1,000 mcg  1,000 mcg Oral Daily Loleta RoseForbach, Cory, MD   1,000 mcg at 08/25/18 1049   Current Outpatient Medications  Medication Sig Dispense Refill  . acetaminophen (TYLENOL) 325 MG tablet Take 2 tablets (650 mg total) by mouth 2 (two) times daily. As needed (Patient taking differently: Take 650 mg by mouth 2 (two) times daily as needed. As needed) 60 tablet 5  . amLODipine (NORVASC) 10 MG tablet Take 1 tablet (10 mg total) by mouth daily. 30 tablet 5  . atorvastatin (LIPITOR) 10 MG tablet TAKE 1 TABLET BY MOUTH DAILY. (Patient taking differently: Take 10 mg by mouth every evening. ) 30 tablet 0  . lamoTRIgine (LAMICTAL) 150 MG tablet Take  150 mg by mouth 2 (two) times daily.    Marland Kitchen. MAPAP ARTHRITIS PAIN 650 MG CR tablet TAKE 1 TABLET BY MOUTH 2 TIMES DAILY. AS NEEDED (Patient taking differently: Take 650 mg by mouth 2 (two) times daily. ) 60 tablet 0  . memantine (NAMENDA) 5 MG tablet TAKE 1 TABLET BY MOUTH DAILY (Patient taking differently: Take 5 mg by mouth daily. ) 30 tablet 0  . OLANZapine (ZYPREXA) 10 MG tablet Take 20 mg by mouth at bedtime.     . vitamin B-12 (CYANOCOBALAMIN)  1000 MCG tablet TAKE 1 TABLET BY MOUTH DAILY. (Patient taking differently: Take 1,000 mcg by mouth daily. ) 30 tablet 0    Musculoskeletal: Strength & Muscle Tone: within normal limits Gait & Station: unsteady Patient leans: Backward  Psychiatric Specialty Exam: Physical Exam  Nursing note and vitals reviewed. Constitutional: He appears well-developed and well-nourished. No distress.  HENT:  Head: Normocephalic and atraumatic.  Eyes: Pupils are equal, round, and reactive to light.  Neck: Normal range of motion.  Cardiovascular: Normal rate and regular rhythm.  Respiratory: Effort normal. No respiratory distress.  Musculoskeletal: Normal range of motion.  Neurological: He is alert.  Psychiatric: His speech is normal and behavior is normal. Thought content normal. His mood appears anxious. His affect is blunt. Cognition and memory are impaired. He expresses impulsivity. He exhibits a depressed mood.    Review of Systems  Unable to perform ROS: Psychiatric disorder  Musculoskeletal: Positive for joint pain.  Psychiatric/Behavioral: Positive for memory loss. The patient has insomnia.     Blood pressure (!) 120/53, pulse 91, temperature 99.1 F (37.3 C), temperature source Oral, resp. rate 16, height 5\' 4"  (1.626 m), weight 77.1 kg, SpO2 96 %.Body mass index is 29.18 kg/m.  General Appearance: Casual  Eye Contact:  None  Speech:  Clear and coherent  Volume:  Normal  Mood:  Depressed, anxious  Affect:  Congruent  Thought Process:  Logical  Orientation:  Other:  Self  Thought Content:  WDL  Suicidal Thoughts:  No  Homicidal Thoughts:  No  Memory:  Poor  Judgement:  Fair  Insight:  Lacking  Psychomotor Activity:  Decreased  Concentration:  Concentration: Poor and Attention Span: Poor  Recall:  Poor  Fund of Knowledge:  Fair  Language:  Fair  Akathisia:  NA  Handed:  Right  AIMS (if indicated):     Assets:  Physical Health Social Support  ADL's:  Impaired  Cognition:   Impaired--moderate to severe  Sleep:   Patient does not state     Treatment Plan Summary: Continue involuntary commitment Daily contact with patient to assess and evaluate symptoms and progress in treatment and Medication management  Dementia with behavioral disturbances: -Continued Namenda 5 mg daily -Seeking geriatric psych  Schizophrenia: -Continued Zyprexa 20 mg at bedtime -Continued Lamictal 150 mg BID  Disposition: Supportive therapy provided about ongoing stressors. Patient does meet criteria for geriatric psychiatric inpatient admission for stabilization and treatment.  Nanine MeansJamison , NP 08/25/2018 12:16 PM

## 2018-08-25 NOTE — Progress Notes (Signed)
Speech Language Pathology Treatment: Dysphagia  Patient Details Name: Timothy [name] MRN: [id number] DOB: 04-30-1932 Today's Date: 08/25/2018 Time: 0950-1050 SLP Time Calculation (min) (ACUTE ONLY): 60 min  Assessment / Plan / Recommendation Clinical Impression  Pt seen today for ongoing assessment of swallowing and toleration of the recommended dysphagia diet at evaluation. NSG reported pt is presenting w/ increased overt s/s of dysphagia moreso w/ the thin liquids (coughing when drinking). Upon further chart reviewing, pt has a h/o a MBSS in 2017 in which oropharyngeal phase dysphagia was noted then and a dysphagia diet w/ thickened liquids was recommended. Unsure of his recent diet at the [place].  Upon meeting w/ pt, his tics and chanting had calmed and he verbally greeted SLP. Noted declined Cognitive status and engagement in general but pt was able to follow through w/ tasks w/ cues more readily, and easily.  Pt positioned upright in bed and given trials of Nectar liquids via cup/straw. No immediate, overt clinical s/s of aspiration noted - clear vocal quality and no coughing. NSG present stated this was "much better than this morning w/ the thin liquids". Noted pt did exhibit audible swallows at times, multiple swallows. This was more apparent when attempting drinking a Nectar consistency Nutritional Supplement(Nepro) in which coughing was also noted. SLP continued presenting only the regular Nectar consistency drinks w/ pt helping to hold the cup when drinking. Noted Impulsive drinking behavior to which SLP pinched the straw to monitor sip/bolus size and to slow pt down. Verbal cues and education given but unsure of pt's comprehension of instruction. Trials of Purees in room were tolerated well; NSG reported same this morning.  Recommend modifying the diet consistency to a Dysphagia level 2(w/ purees added) and NECTAR consistency liquids; strict Aspiration precautions and monitoring at meals  d/t Cognitive/Mental Status decline; Pinch Straw to curp Impulsive Drinking Behavior. Supervision at all meals. ST services will continue to monitor pt's status while admitted. Potential need for objective swallow study next 2-3 days. NSG updated, precautions posted in room/chart.     HPI HPI: Pt is a 85 y.o. male history of Schizophrenia, Seizures, TIAs, A. fib, stroke, and Dementia w/ behavioral disturbance. EMS reports the patient was at his group home, he became agitated had "tore up" his room and was acting very agitated and aggressive prompting police and EMS call. Group home reported concerned that this was due to his mental illness and flashbacks from [place] with previous history of similar behavior in the past. Per further chart history, pt received a MBSS in 08/2015 w/ Moderate oropharyngeal phase dysphagia noted then and the recommendation for a dysphagia diet including thickened liquids d/t the risk for aspiration; moderate pharyngeal phase residue noted on study then. Unsure if pt has been on such a diet consistency at his [place] since then. Pt is still unable to give any specific history of himself when attempting to answer questions.       SLP Plan  Continue with current plan of care       Recommendations  Diet recommendations: Dysphagia 2 (fine chop);Nectar-thick liquid(added purees) Liquids provided via: Cup;Straw(monitor) Medication Administration: Crushed with puree(for safer swallowing) Supervision: Staff to assist with self feeding;Full supervision/cueing for compensatory strategies Compensations: Minimize environmental distractions;Slow rate;Small sips/bites;Lingual sweep for clearance of pocketing;Multiple dry swallows after each bite/sip;Follow solids with liquid Postural Changes and/or Swallow Maneuvers: Seated upright 90 degrees;Upright 30-60 min after meal                General  recommendations: (Dietician f/u) Oral Care Recommendations: Oral care  BID;Staff/trained caregiver to provide oral care;Oral care before and after PO Follow up Recommendations: Skilled Nursing facility(TBD) SLP Visit Diagnosis: Dysphagia, oropharyngeal phase (R13.12)(Cognitive decline) Plan: Continue with current plan of care       GO                 Timothy SomKatherine Sharod Petsch, MS, CCC-SLP Timothy [name] 08/25/2018, 11:35 AM

## 2018-08-25 NOTE — ED Provider Notes (Signed)
Discussed patient's case with nurse Val, also with [facility] psychiatry service  Patient seems to have a bit of a study decline becoming more somnolent, but is able to sit up, it is breakfast, conversed with consultant team this morning he reported just seems to be progressively more tired and restful.  He is alert and in no distress with normal vital signs.  He has been coughing after drinking thin liquids, and has been changed by speech to nectar thick now.  Will reevaluate further with a CBC, BMP and a chest x-ray to evaluate for aspiration.  Psychiatry continuing to recommend Geri psych placement in no acute medication changes at this point time.  Labs CXR obtained and reviewed and negative for acute today  Patient not have any coughing episodes with nectar thick diet.  Awaiting final disposition   Delman Kitten, MD 08/25/18 1329

## 2018-08-25 NOTE — ED Notes (Signed)
This tech fed pt. Pt ate all minced chicken w/gravy, all puree pineapple, half cup of applesauce, half of mashed potatoes and two thirds of minced carrots. Pt did drink all of the nectar thick tea but instead of using a straw this tech spooned out each sip and pt did much better - no coughing. Pt brief dry at this time.

## 2018-08-25 NOTE — ED Notes (Signed)
SLP at bedside for eval

## 2018-08-25 NOTE — ED Notes (Signed)
Pt turned onto back at this time by this RN. This RN will continue to monitor.

## 2018-08-25 NOTE — ED Notes (Signed)
Belongings taken to [employer] by [place] NT

## 2018-08-25 NOTE — ED Provider Notes (Signed)
-----------------------------------------   5:15 AM on 08/25/2018 -----------------------------------------   Blood pressure (!) 141/67, pulse 86, temperature 97.8 F (36.6 C), temperature source Axillary, resp. rate 13, height 5\' 4"  (1.626 m), weight 77.1 kg, SpO2 93 %.  The patient is calm and cooperative at this time.  There have been no acute events since the last update.  Awaiting disposition plan from Behavioral Medicine team.   Arta Silence, MD 08/25/18 [phone]

## 2018-08-25 NOTE — ED Notes (Signed)
Pt bed sheets and brief changed by this RN and Matt,RN. Pt repositioned in bed. New warm blankets given to pt at this time.

## 2018-08-25 NOTE — BH Assessment (Signed)
TTS telephoned [facility] to check on bed status, however, the admissions staff were unavailable.  TTS left a message requesting a return call.

## 2018-08-25 NOTE — ED Notes (Signed)
Unlabored. NAD. Sleeping since meal.

## 2018-08-25 NOTE — ED Notes (Addendum)
Pt ate oatmeal, yogurt and pudding. Did not want anything else to eat. Checked diaper, dry at this time. Pt was pulled up and positioned appropriately for feeding. Assisted by this RN. Followed speech therapy recommendations with thin liquids but pt continues to cough while drinking.  No clearing throat or coughing with dysphagia foods but odd noise noted at times when swallowing.  When drinking thin liquids patient would cough with drinking and after done.  Speech called and asked if can take another look at patient. Dr Jacqualine Code notified.

## 2018-08-25 NOTE — ED Notes (Signed)
Pt meds crushed and given with applesauce

## 2018-08-25 NOTE — ED Notes (Signed)
ED tech at bedside helping feed pt

## 2018-08-26 ENCOUNTER — Other Ambulatory Visit: Payer: Self-pay

## 2018-08-26 ENCOUNTER — Emergency Department: Payer: Medicare Other

## 2018-08-26 DIAGNOSIS — Z716 Tobacco abuse counseling: Secondary | ICD-10-CM | POA: Diagnosis not present

## 2018-08-26 DIAGNOSIS — Z8673 Personal history of transient ischemic attack (TIA), and cerebral infarction without residual deficits: Secondary | ICD-10-CM | POA: Diagnosis not present

## 2018-08-26 DIAGNOSIS — N3000 Acute cystitis without hematuria: Secondary | ICD-10-CM | POA: Diagnosis present

## 2018-08-26 DIAGNOSIS — M6520 Calcific tendinitis, unspecified site: Secondary | ICD-10-CM | POA: Diagnosis present

## 2018-08-26 DIAGNOSIS — J189 Pneumonia, unspecified organism: Secondary | ICD-10-CM | POA: Diagnosis present

## 2018-08-26 DIAGNOSIS — Z20828 Contact with and (suspected) exposure to other viral communicable diseases: Secondary | ICD-10-CM | POA: Diagnosis present

## 2018-08-26 DIAGNOSIS — J690 Pneumonitis due to inhalation of food and vomit: Secondary | ICD-10-CM | POA: Diagnosis present

## 2018-08-26 DIAGNOSIS — I10 Essential (primary) hypertension: Secondary | ICD-10-CM | POA: Diagnosis present

## 2018-08-26 DIAGNOSIS — G40909 Epilepsy, unspecified, not intractable, without status epilepticus: Secondary | ICD-10-CM | POA: Diagnosis present

## 2018-08-26 DIAGNOSIS — F4310 Post-traumatic stress disorder, unspecified: Secondary | ICD-10-CM | POA: Diagnosis present

## 2018-08-26 DIAGNOSIS — F203 Undifferentiated schizophrenia: Secondary | ICD-10-CM | POA: Diagnosis present

## 2018-08-26 DIAGNOSIS — G4709 Other insomnia: Secondary | ICD-10-CM

## 2018-08-26 DIAGNOSIS — F0391 Unspecified dementia with behavioral disturbance: Secondary | ICD-10-CM | POA: Diagnosis present

## 2018-08-26 DIAGNOSIS — F919 Conduct disorder, unspecified: Secondary | ICD-10-CM | POA: Diagnosis present

## 2018-08-26 DIAGNOSIS — Z79899 Other long term (current) drug therapy: Secondary | ICD-10-CM | POA: Diagnosis not present

## 2018-08-26 DIAGNOSIS — F17210 Nicotine dependence, cigarettes, uncomplicated: Secondary | ICD-10-CM | POA: Diagnosis present

## 2018-08-26 DIAGNOSIS — F201 Disorganized schizophrenia: Secondary | ICD-10-CM | POA: Diagnosis not present

## 2018-08-26 DIAGNOSIS — R4182 Altered mental status, unspecified: Secondary | ICD-10-CM | POA: Diagnosis present

## 2018-08-26 LAB — COMPREHENSIVE METABOLIC PANEL
ALT: 21 U/L (ref 0–44)
AST: 39 U/L (ref 15–41)
Albumin: 3.6 g/dL (ref 3.5–5.0)
Alkaline Phosphatase: 50 U/L (ref 38–126)
Anion gap: 11 (ref 5–15)
BUN: 19 mg/dL (ref 8–23)
CO2: 27 mmol/L (ref 22–32)
Calcium: 9.2 mg/dL (ref 8.9–10.3)
Chloride: 100 mmol/L (ref 98–111)
Creatinine, Ser: 0.92 mg/dL (ref 0.61–1.24)
GFR calc Af Amer: >60 mL/min (ref >60)
GFR calc non Af Amer: >60 mL/min (ref >60)
Glucose, Bld: 106 mg/dL — ABNORMAL HIGH (ref 70–99)
Potassium: 4.3 mmol/L (ref 3.5–5.1)
Sodium: 138 mmol/L (ref 135–145)
Total Bilirubin: 1.4 mg/dL — ABNORMAL HIGH (ref 0.3–1.2)
Total Protein: 7.4 g/dL (ref 6.5–8.1)

## 2018-08-26 LAB — CBC WITH DIFFERENTIAL/PLATELET
Abs Immature Granulocytes: 0.04 10*3/uL (ref 0.00–0.07)
Basophils Absolute: 0 10*3/uL (ref 0.0–0.1)
Basophils Relative: 0 %
Eosinophils Absolute: 0.1 10*3/uL (ref 0.0–0.5)
Eosinophils Relative: 2 %
HCT: 44.9 % (ref 39.0–52.0)
Hemoglobin: 14.8 g/dL (ref 13.0–17.0)
Immature Granulocytes: 1 %
Lymphocytes Relative: 9 %
Lymphs Abs: 0.7 10*3/uL (ref 0.7–4.0)
MCH: 27.6 pg (ref 26.0–34.0)
MCHC: 33 g/dL (ref 30.0–36.0)
MCV: 83.6 fL (ref 80.0–100.0)
Monocytes Absolute: 1.3 10*3/uL — ABNORMAL HIGH (ref 0.1–1.0)
Monocytes Relative: 15 %
Neutro Abs: 6.3 10*3/uL (ref 1.7–7.7)
Neutrophils Relative %: 73 %
Platelets: 185 10*3/uL (ref 150–400)
RBC: 5.37 MIL/uL (ref 4.22–5.81)
RDW: 13.9 % (ref 11.5–15.5)
WBC: 8.5 10*3/uL (ref 4.0–10.5)
nRBC: 0 % (ref 0.0–0.2)

## 2018-08-26 LAB — URINALYSIS, COMPLETE (UACMP) WITH MICROSCOPIC
Bacteria, UA: NONE SEEN
Bilirubin Urine: NEGATIVE
Glucose, UA: NEGATIVE mg/dL
Hgb urine dipstick: NEGATIVE
Ketones, ur: NEGATIVE mg/dL
Leukocytes,Ua: NEGATIVE
Nitrite: NEGATIVE
Protein, ur: 30 mg/dL — AB
Specific Gravity, Urine: 1.032 — ABNORMAL HIGH (ref 1.005–1.030)
pH: 5 (ref 5.0–8.0)

## 2018-08-26 LAB — LACTIC ACID, PLASMA: Lactic Acid, Venous: 1.1 mmol/L (ref 0.5–1.9)

## 2018-08-26 LAB — SARS CORONAVIRUS 2 BY RT PCR (HOSPITAL ORDER, PERFORMED IN ~~LOC~~ HOSPITAL LAB): SARS Coronavirus 2: NEGATIVE

## 2018-08-26 LAB — MRSA PCR SCREENING: MRSA by PCR: NEGATIVE

## 2018-08-26 LAB — STREP PNEUMONIAE URINARY ANTIGEN: Strep Pneumo Urinary Antigen: NEGATIVE

## 2018-08-26 MED ORDER — SODIUM CHLORIDE 0.9% FLUSH: 3.0000 mL | INTRAVENOUS | Status: DC | PRN

## 2018-08-26 MED ORDER — SODIUM CHLORIDE 0.9 % IV SOLN
2.0000 g | Freq: Once | INTRAVENOUS | Status: AC
  Administered 2018-08-26: 2 g via INTRAVENOUS
  Filled 2018-08-26: qty 20

## 2018-08-26 MED ORDER — ONDANSETRON HCL 4 MG/2ML IJ SOLN: 4.0000 mg | Freq: Four times a day (QID) | INTRAMUSCULAR | Status: DC | PRN

## 2018-08-26 MED ORDER — ACETAMINOPHEN 325 MG PO TABS
650.0000 mg | ORAL_TABLET | Freq: Once | ORAL | Status: AC
  Administered 2018-08-26: 650 mg via ORAL
  Filled 2018-08-26: qty 2

## 2018-08-26 MED ORDER — SODIUM CHLORIDE 0.9 % IV BOLUS
1000.0000 mL | Freq: Once | INTRAVENOUS | Status: AC
  Administered 2018-08-26: 08:00:00 1000 mL via INTRAVENOUS

## 2018-08-26 MED ORDER — ACETAMINOPHEN 650 MG RE SUPP: 650.0000 mg | Freq: Four times a day (QID) | RECTAL | Status: DC | PRN

## 2018-08-26 MED ORDER — NICOTINE 14 MG/24HR TD PT24
14.0000 mg | MEDICATED_PATCH | Freq: Every day | TRANSDERMAL | Status: DC
  Administered 2018-08-26 – 2018-09-03 (×9): 14 mg via TRANSDERMAL
  Filled 2018-08-26 (×9): qty 1

## 2018-08-26 MED ORDER — SODIUM CHLORIDE 0.9% FLUSH
3.0000 mL | Freq: Two times a day (BID) | INTRAVENOUS | Status: DC
  Administered 2018-08-26 – 2018-09-02 (×15): 3 mL via INTRAVENOUS

## 2018-08-26 MED ORDER — ACETAMINOPHEN 325 MG PO TABS
650.0000 mg | ORAL_TABLET | Freq: Four times a day (QID) | ORAL | Status: DC | PRN
  Administered 2018-08-30: 650 mg via ORAL
  Filled 2018-08-26: qty 2

## 2018-08-26 MED ORDER — VANCOMYCIN HCL 10 G IV SOLR: 1250.0000 mg | INTRAVENOUS | Status: DC

## 2018-08-26 MED ORDER — GUAIFENESIN 100 MG/5ML PO SOLN: 5.0000 mL | ORAL | Status: DC | PRN

## 2018-08-26 MED ORDER — HYDROCODONE-ACETAMINOPHEN 5-325 MG PO TABS
1.0000 | ORAL_TABLET | ORAL | Status: DC | PRN
  Administered 2018-08-27 – 2018-08-31 (×2): 1 via ORAL
  Filled 2018-08-26 (×2): qty 1

## 2018-08-26 MED ORDER — ONDANSETRON HCL 4 MG PO TABS: 4.0000 mg | ORAL_TABLET | Freq: Four times a day (QID) | ORAL | Status: DC | PRN

## 2018-08-26 MED ORDER — ALBUTEROL SULFATE (2.5 MG/3ML) 0.083% IN NEBU: 2.5000 mg | INHALATION_SOLUTION | RESPIRATORY_TRACT | Status: DC | PRN

## 2018-08-26 MED ORDER — ENOXAPARIN SODIUM 40 MG/0.4ML ~~LOC~~ SOLN
40.0000 mg | SUBCUTANEOUS | Status: DC
  Administered 2018-08-26 – 2018-09-03 (×9): 40 mg via SUBCUTANEOUS
  Filled 2018-08-26 (×9): qty 0.4

## 2018-08-26 MED ORDER — BISACODYL 5 MG PO TBEC: 5.0000 mg | DELAYED_RELEASE_TABLET | Freq: Every day | ORAL | Status: DC | PRN

## 2018-08-26 MED ORDER — VANCOMYCIN HCL 10 G IV SOLR
1750.0000 mg | Freq: Once | INTRAVENOUS | Status: AC
  Administered 2018-08-26: 11:00:00 1750 mg via INTRAVENOUS
  Filled 2018-08-26: qty 1750

## 2018-08-26 MED ORDER — ASPIRIN 81 MG PO CHEW
81.0000 mg | CHEWABLE_TABLET | Freq: Every day | ORAL | Status: DC
  Administered 2018-08-26 – 2018-09-03 (×9): 81 mg via ORAL
  Filled 2018-08-26 (×9): qty 1

## 2018-08-26 MED ORDER — SODIUM CHLORIDE 0.9 % IV SOLN
500.0000 mg | Freq: Once | INTRAVENOUS | Status: AC
  Administered 2018-08-26: 500 mg via INTRAVENOUS
  Filled 2018-08-26: qty 500

## 2018-08-26 MED ORDER — SODIUM CHLORIDE 0.9 % IV SOLN
2.0000 g | Freq: Two times a day (BID) | INTRAVENOUS | Status: DC
  Administered 2018-08-26 – 2018-08-28 (×4): 2 g via INTRAVENOUS
  Filled 2018-08-26 (×5): qty 2

## 2018-08-26 MED ORDER — SODIUM CHLORIDE 0.9 % IV SOLN
250.0000 mL | INTRAVENOUS | Status: DC | PRN
  Administered 2018-08-26: 250 mL via INTRAVENOUS
  Administered 2018-08-26 – 2018-08-27 (×2): 20 mL via INTRAVENOUS

## 2018-08-26 MED ORDER — SENNOSIDES-DOCUSATE SODIUM 8.6-50 MG PO TABS: 1.0000 | ORAL_TABLET | Freq: Every evening | ORAL | Status: DC | PRN

## 2018-08-26 NOTE — ED Notes (Signed)
Legal guardian notified of admission at [place].

## 2018-08-26 NOTE — Plan of Care (Signed)
Pt admitted today from the ED.  IVC. Sitter at the bedside. Pt oriented to self, follow commands. Calm, cooperative. Pt is somnolent, but easily aroused.  Poor appetite. Needs total assist with meals.

## 2018-08-26 NOTE — ED Notes (Signed)
ED TO INPATIENT HANDOFF REPORT  ED Nurse Name and Phone #: Durene CalHunter RN   S Name/Age/Gender [name] 85 y.o. male Room/Bed: ED24A/ED24A  Code Status   Code Status: Prior  Home/SNF/Other Boarding Home Patient oriented to: self Is this baseline? Yes   Triage Complete: Triage complete  Chief Complaint Psych Eval  Triage Note Pt arrives via [facility] from a group home for "tearing up his room" an d being combative. Per EMS pt has a hx of PTSD and will have flashbacks that precipitate these events. Pt arrives with no pants on and only a shirt and socks. Pt flailing arms and chanting phrases over and over during triage.    Allergies No Known Allergies  Level of Care/Admitting Diagnosis ED Disposition    ED Disposition Condition Comment   Admit  Hospital Area: [facility] [[id number]]  Level of Care: Med-Surg [16]  Covid Evaluation: Confirmed COVID Negative  Diagnosis: Pneumonia [227785]  Admitting Physician: Shaune PollackCHEN, QING [[id number]]  Attending Physician: Shaune PollackCHEN, QING [[id number]]  Estimated length of stay: past midnight tomorrow  Certification:: I certify this patient will need inpatient services for at least 2 midnights  PT Class (Do Not Modify): Inpatient [101]  PT Acc Code (Do Not Modify): Private [1]       B Medical/Surgery History Past Medical History:  Diagnosis Date  . Hypertension   . Schizophrenia (HCC)   . Seizures (HCC)   . TIA (transient ischemic attack)    History reviewed. No pertinent surgical history.   A IV Location/Drains/Wounds Patient Lines/Drains/Airways Status   Active Line/Drains/Airways    Name:   Placement date:   Placement time:   Site:   Days:   Peripheral IV 08/26/18 Left Arm   08/26/18    0735    Arm   less than 1   Wound / Incision (Open or Dehisced) 07/25/14 Other (Comment) Head Right large bruise   07/25/14    1155    Head   1493          Intake/Output Last 24 hours No intake or output data in the 24 hours ending  08/26/18 0926  Labs/Imaging Results for orders placed or performed during the hospital encounter of 08/21/18 (from the past 48 hour(s))  Glucose, capillary     Status: Abnormal   Collection Time: 08/24/18  1:46 PM  Result Value Ref Range   Glucose-Capillary 125 (H) 70 - 99 mg/dL  Glucose, capillary     Status: Abnormal   Collection Time: 08/25/18  8:38 AM  Result Value Ref Range   Glucose-Capillary 113 (H) 70 - 99 mg/dL   Comment 1 Notify RN    Comment 2 Document in Chart   Basic metabolic panel     Status: Abnormal   Collection Time: 08/25/18  9:46 AM  Result Value Ref Range   Sodium 139 135 - 145 mmol/L   Potassium 3.6 3.5 - 5.1 mmol/L   Chloride 101 98 - 111 mmol/L   CO2 29 22 - 32 mmol/L   Glucose, Bld 213 (H) 70 - 99 mg/dL   BUN 18 8 - 23 mg/dL   Creatinine, Ser 4.031.16 0.61 - 1.24 mg/dL   Calcium 9.2 8.9 - 47.410.3 mg/dL   GFR calc non Af Amer 58 (L) >60 mL/min   GFR calc Af Amer >60 >60 mL/min   Anion gap 9 5 - 15    Comment: Performed at [facility], [address] [place], [place] [address]  CBC  Status: None   Collection Time: 08/25/18  9:46 AM  Result Value Ref Range   WBC 9.1 4.0 - 10.5 K/uL   RBC 5.62 4.22 - 5.81 MIL/uL   Hemoglobin 15.4 13.0 - 17.0 g/dL   HCT 46.9 39.0 - 52.0 %   MCV 83.5 80.0 - 100.0 fL   MCH 27.4 26.0 - 34.0 pg   MCHC 32.8 30.0 - 36.0 g/dL   RDW 13.9 11.5 - 15.5 %   Platelets 175 150 - 400 K/uL   nRBC 0.0 0.0 - 0.2 %    Comment: Performed at [facility], [address] [place], [place] [address]  CBC with Differential     Status: Abnormal   Collection Time: 08/26/18  7:11 AM  Result Value Ref Range   WBC 8.5 4.0 - 10.5 K/uL   RBC 5.37 4.22 - 5.81 MIL/uL   Hemoglobin 14.8 13.0 - 17.0 g/dL   HCT 44.9 39.0 - 52.0 %   MCV 83.6 80.0 - 100.0 fL   MCH 27.6 26.0 - 34.0 pg   MCHC 33.0 30.0 - 36.0 g/dL   RDW 13.9 11.5 - 15.5 %   Platelets 185 150 - 400 K/uL   nRBC 0.0 0.0 - 0.2 %   Neutrophils Relative % 73 %   Neutro  Abs 6.3 1.7 - 7.7 K/uL   Lymphocytes Relative 9 %   Lymphs Abs 0.7 0.7 - 4.0 K/uL   Monocytes Relative 15 %   Monocytes Absolute 1.3 (H) 0.1 - 1.0 K/uL   Eosinophils Relative 2 %   Eosinophils Absolute 0.1 0.0 - 0.5 K/uL   Basophils Relative 0 %   Basophils Absolute 0.0 0.0 - 0.1 K/uL   Immature Granulocytes 1 %   Abs Immature Granulocytes 0.04 0.00 - 0.07 K/uL    Comment: Performed at [facility], [address] [name], [place] [address]  Comprehensive metabolic panel     Status: Abnormal   Collection Time: 08/26/18  7:11 AM  Result Value Ref Range   Sodium 138 135 - 145 mmol/L   Potassium 4.3 3.5 - 5.1 mmol/L    Comment: HEMOLYSIS AT THIS LEVEL MAY AFFECT RESULT   Chloride 100 98 - 111 mmol/L   CO2 27 22 - 32 mmol/L   Glucose, Bld 106 (H) 70 - 99 mg/dL   BUN 19 8 - 23 mg/dL   Creatinine, Ser 0.92 0.61 - 1.24 mg/dL   Calcium 9.2 8.9 - 10.3 mg/dL   Total Protein 7.4 6.5 - 8.1 g/dL   Albumin 3.6 3.5 - 5.0 g/dL   AST 39 15 - 41 U/L    Comment: HEMOLYSIS AT THIS LEVEL MAY AFFECT RESULT   ALT 21 0 - 44 U/L   Alkaline Phosphatase 50 38 - 126 U/L   Total Bilirubin 1.4 (H) 0.3 - 1.2 mg/dL    Comment: HEMOLYSIS AT THIS LEVEL MAY AFFECT RESULT   GFR calc non Af Amer >60 >60 mL/min   GFR calc Af Amer >60 >60 mL/min   Anion gap 11 5 - 15    Comment: Performed at [facility], [address] [place], [place] [address]  Lactic acid, plasma     Status: None   Collection Time: 08/26/18  7:11 AM  Result Value Ref Range   Lactic Acid, Venous 1.1 0.5 - 1.9 mmol/L    Comment: Performed at [facility], [address], [place],  [address]  SARS Coronavirus 2 (CEPHEID - Performed in [facility] hospital lab), Hosp Order     Status: None   Collection Time: 08/26/18  7:37 AM   Specimen: Nasopharyngeal Swab  Result Value Ref Range   SARS Coronavirus 2 NEGATIVE NEGATIVE    Comment: (NOTE) If result is NEGATIVE SARS-CoV-2 target nucleic acids are NOT  DETECTED. The SARS-CoV-2 RNA is generally detectable in upper and lower  respiratory specimens during the acute phase of infection. The lowest  concentration of SARS-CoV-2 viral copies this assay can detect is 250  copies / mL. A negative result does not preclude SARS-CoV-2 infection  and should not be used as the sole basis for treatment or other  patient management decisions.  A negative result may occur with  improper specimen collection / handling, submission of specimen other  than nasopharyngeal swab, presence of viral mutation(s) within the  areas targeted by this assay, and inadequate number of viral copies  (<250 copies / mL). A negative result must be combined with clinical  observations, patient history, and epidemiological information. If result is POSITIVE SARS-CoV-2 target nucleic acids are DETECTED. The SARS-CoV-2 RNA is generally detectable in upper and lower  respiratory specimens dur ing the acute phase of infection.  Positive  results are indicative of active infection with SARS-CoV-2.  Clinical  correlation with patient history and other diagnostic information is  necessary to determine patient infection status.  Positive results do  not rule out bacterial infection or co-infection with other viruses. If result is PRESUMPTIVE POSTIVE SARS-CoV-2 nucleic acids MAY BE PRESENT.   A presumptive positive result was obtained on the submitted specimen  and confirmed on repeat testing.  While 2019 novel coronavirus  (SARS-CoV-2) nucleic acids may be present in the submitted sample  additional confirmatory testing may be necessary for epidemiological  and / or clinical management purposes  to differentiate between  SARS-CoV-2 and other Sarbecovirus currently known to infect humans.  If clinically indicated additional testing with an alternate test  methodology [id number]) is advised. The SARS-CoV-2 RNA is generally  detectable in upper and lower respiratory sp ecimens during  the acute  phase of infection. The expected result is Negative. Fact Sheet for Patients:  BoilerBrush.com.cyhttps://www.fda.gov/media/136312/download Fact Sheet for Healthcare Providers: https://pope.com/https://www.fda.gov/media/136313/download This test is not yet approved or cleared by the [place] FDA and has been authorized for detection and/or diagnosis of SARS-CoV-2 by FDA under an Emergency Use Authorization (EUA).  This EUA will remain in effect (meaning this test can be used) for the duration of the COVID-19 declaration under Section 564(b)(1) of the Act, 21 U.S.C. section 360bbb-3(b)(1), unless the authorization is terminated or revoked sooner. Performed at [facility], [address] [place], [place] [address]    Dg Chest Portable 1 View  Result Date: 08/26/2018 CLINICAL DATA:  New fever. Smoker. EXAM: PORTABLE CHEST 1 VIEW COMPARISON:  08/25/2018 FINDINGS: The cardiomediastinal silhouette is unchanged. Lung volumes remain low. There is mild chronic eventration of the right hemidiaphragm. There is mild right basilar opacity. The left lung is clear. No sizable pleural effusion or pneumothorax is identified. IMPRESSION: Low lung volumes with mild right basilar opacity which may reflect atelectasis or infection. Electronically Signed   By: Sebastian AcheAllen  Grady M.D.   On: 08/26/2018 07:51   Dg Chest Portable 1 View  Result Date: 08/25/2018 CLINICAL DATA:  Cough.  Suspected aspiration. EXAM: PORTABLE CHEST 1 VIEW COMPARISON:  08/22/2018 FINDINGS: The heart size and mediastinal contours are within normal limits. Eventration of right hemidiaphragm is again noted. No evidence of  pulmonary infiltrate or edema. No evidence of pleural effusion. IMPRESSION: No active disease. Electronically Signed   By: Myles Rosenthal M.D.   On: 08/25/2018 10:59    Pending Labs Unresulted Labs (From admission, onward)    Start     Ordered   08/26/18 0737  Urinalysis, Complete w Microscopic  Once,   STAT     08/26/18 0736    08/26/18 0709  Blood culture (routine x 2)  BLOOD CULTURE X 2,   STAT     08/26/18 0709   08/21/18 1726  Urine Drug Screen, Qualitative  Once,   STAT     08/21/18 1725   Signed and Held  Basic metabolic panel  Tomorrow morning,   R     Signed and Held   Signed and Held  CBC  Tomorrow morning,   R     Signed and Held   Signed and Held  Creatinine, serum  (enoxaparin (LOVENOX)    CrCl >/= 30 ml/min)  Weekly,   R    Comments: while on enoxaparin therapy    Signed and Held   Signed and Held  HIV antibody (Routine Screening)  Once,   R     Signed and Held   Signed and Held  Culture, sputum-assessment  Once,   R    Question:  Patient immune status  Answer:  Normal   Signed and Held   Signed and Held  Gram stain  Once,   R    Question:  Patient immune status  Answer:  Normal   Signed and Held   Signed and Held  Strep pneumoniae urinary antigen  Once,   R     Signed and Held   Signed and Held  MRSA PCR Screening  Once,   R    Question:  Patient immune status  Answer:  Normal   Signed and Held          Vitals/Pain Today's Vitals   08/26/18 0715 08/26/18 0730 08/26/18 0745 08/26/18 0750  BP:    (!) 150/71  Pulse: 88 88 89 90  Resp:    14  Temp:      TempSrc:      SpO2: 100% 100% 100% 100%  Weight:      Height:      PainSc:        Isolation Precautions No active isolations  Medications Medications  amLODipine (NORVASC) tablet 10 mg (10 mg Oral Given 08/25/18 1054)  atorvastatin (LIPITOR) tablet 10 mg (10 mg Oral Given 08/25/18 1057)  lamoTRIgine (LAMICTAL) tablet 150 mg (150 mg Oral Given 08/25/18 2352)  memantine (NAMENDA) tablet 5 mg (5 mg Oral Given 08/25/18 1054)  OLANZapine (ZYPREXA) tablet 20 mg (20 mg Oral Given 08/25/18 2353)  vitamin B-12 (CYANOCOBALAMIN) tablet 1,000 mcg (1,000 mcg Oral Given 08/25/18 1049)  azithromycin (ZITHROMAX) 500 mg in sodium chloride 0.9 % 250 mL IVPB (500 mg Intravenous New Bag/Given 08/26/18 0914)  OLANZapine zydis (ZYPREXA) disintegrating  tablet 10 mg (10 mg Oral Given 08/21/18 1730)  LORazepam (ATIVAN) injection 1 mg (1 mg Intravenous Given 08/22/18 1729)  LORazepam (ATIVAN) 2 MG/ML injection (1 mg  Given 08/23/18 0210)  haloperidol lactate (HALDOL) 5 MG/ML injection (5 mg  Given 08/23/18 0216)  cefTRIAXone (ROCEPHIN) 1 g in sodium chloride 0.9 % 100 mL IVPB (0 g Intravenous Stopped 08/24/18 1231)  sodium chloride 0.9 % bolus 1,000 mL (0 mLs Intravenous Stopped 08/24/18 1436)  LORazepam (ATIVAN) tablet 1 mg (1 mg Oral  Given 08/24/18 1634)  sodium chloride 0.9 % bolus 1,000 mL (0 mLs Intravenous Stopped 08/26/18 0828)  acetaminophen (TYLENOL) tablet 650 mg (650 mg Oral Given 08/26/18 0737)  cefTRIAXone (ROCEPHIN) 2 g in sodium chloride 0.9 % 100 mL IVPB (0 g Intravenous Stopped 08/26/18 0914)    Mobility non-ambulatory High fall risk   Focused Assessments pysch   R Recommendations: See Admitting Provider Note  Report given to:   Additional Notes: PATIENT HAS LEGAL GUARDIAN. Pt ALSO IVC.

## 2018-08-26 NOTE — Progress Notes (Signed)
Will notify legal guardian to also answer question

## 2018-08-26 NOTE — Progress Notes (Signed)
Advanced Care Plan.  Purpose of Encounter: CODE STATUS. Parties in Attendance: The patient and me. Patient's Decisional Capacity: Yes. Medical Story: [name]  is a 85 y.o. male with a known history of hypertension, schizophrenia, seizure disorder and TIA.  He is being admitted for aspiration pneumonia.  I discussed with the patient about his current condition, prognosis and CODE STATUS.  The patient wants to be resuscitated and intubated if he has cardiopulmonary arrest. Plan:  Code Status: Full code. Time spent discussing advance care planning: 17 minutes.

## 2018-08-26 NOTE — ED Notes (Signed)
Spoke with Production assistant, radio regarding IVC status. Per [facility] RN, floor to pull tech to sit with patient until other arrangements are made. Charge Estate manager/land agent notified.

## 2018-08-26 NOTE — H&P (Signed)
[facility] [place] [facility]   PATIENT NAME: [name]    MR#:  [id number]  DATE OF BIRTH:  April 01, 1932  DATE OF ADMISSION:  08/21/2018  PRIMARY CARE PHYSICIAN: Patient, No Pcp Per   REQUESTING/REFERRING PHYSICIAN: Shaune PollackIsaacs, [place], MD  CHIEF COMPLAINT:   Chief Complaint  Patient presents with  . Altered Mental Status   Cough and pneumonia. HISTORY OF PRESENT ILLNESS:  Timothy Aas[place]  is a 85 y.o. male with a known history of hypertension, schizophrenia, seizure disorder and TIA.  The patient has been in ED for geriatric psych placement.  He is noticed fever and worsening cough.  Chest x-ray report possible pneumonia.  The patient denies any chills, shortness of breath, nausea, vomiting or leg edema.  ED physician gave her antibiotics and request admission. PAST MEDICAL HISTORY:   Past Medical History:  Diagnosis Date  . Hypertension   . Schizophrenia (HCC)   . Seizures (HCC)   . TIA (transient ischemic attack)     PAST SURGICAL HISTORY:  History reviewed. No pertinent surgical history.  No past surgical history.  SOCIAL HISTORY:   Social History   Tobacco Use  . Smoking status: Current Every Day Smoker    Packs/day: 0.25    Years: 25.00    Pack years: 6.25    Types: Cigarettes  . Smokeless tobacco: Never Used  Substance Use Topics  . Alcohol use: No    Alcohol/week: 0.0 standard drinks    FAMILY HISTORY:  History reviewed. No pertinent family history.  The patient does not know.  DRUG ALLERGIES:  No Known Allergies  REVIEW OF SYSTEMS:   Review of Systems  Constitutional: Negative for chills, fever and malaise/fatigue.  HENT: Negative for sore throat.   Eyes: Negative for blurred vision and double vision.  Respiratory: Positive for cough and sputum production. Negative for hemoptysis, shortness of breath, wheezing and stridor.   Cardiovascular: Negative for chest pain, palpitations, orthopnea and leg swelling.   Gastrointestinal: Negative for abdominal pain, blood in stool, diarrhea, melena, nausea and vomiting.  Genitourinary: Negative for dysuria, flank pain and hematuria.  Musculoskeletal: Negative for back pain and joint pain.  Skin: Negative for rash.  Neurological: Negative for dizziness, sensory change, focal weakness, seizures, loss of consciousness, weakness and headaches.  Endo/Heme/Allergies: Negative for polydipsia.  Psychiatric/Behavioral: Negative for depression. The patient is not nervous/anxious.     MEDICATIONS AT HOME:   Prior to Admission medications   Medication Sig Start Date End Date Taking? Authorizing Provider  acetaminophen (TYLENOL) 325 MG tablet Take 2 tablets (650 mg total) by mouth 2 (two) times daily. As needed Patient taking differently: Take 650 mg by mouth 2 (two) times daily as needed. As needed 02/18/16  Yes Duanne LimerickJones, Deanna C, MD  amLODipine (NORVASC) 10 MG tablet Take 1 tablet (10 mg total) by mouth daily. 02/18/16  Yes Duanne LimerickJones, Deanna C, MD  atorvastatin (LIPITOR) 10 MG tablet TAKE 1 TABLET BY MOUTH DAILY. Patient taking differently: Take 10 mg by mouth every evening.  08/31/16  Yes Duanne LimerickJones, Deanna C, MD  lamoTRIgine (LAMICTAL) 150 MG tablet Take 150 mg by mouth 2 (two) times daily.   Yes [provider]  MAPAP ARTHRITIS PAIN 650 MG CR tablet TAKE 1 TABLET BY MOUTH 2 TIMES DAILY. AS NEEDED Patient taking differently: Take 650 mg by mouth 2 (two) times daily.  04/05/16  Yes Duanne LimerickJones, Deanna C, MD  memantine (NAMENDA) 5 MG tablet TAKE 1 TABLET BY MOUTH DAILY Patient  taking differently: Take 5 mg by mouth daily.  08/31/16  Yes Juline Patch, MD  OLANZapine (ZYPREXA) 10 MG tablet Take 20 mg by mouth at bedtime.    Yes [provider]  vitamin B-12 (CYANOCOBALAMIN) 1000 MCG tablet TAKE 1 TABLET BY MOUTH DAILY. Patient taking differently: Take 1,000 mcg by mouth daily.  08/31/16  Yes Juline Patch, MD      VITAL SIGNS:  Blood pressure (!) 150/71, pulse 90,  temperature (!) 100.7 F (38.2 C), resp. rate 14, height 5\' 4"  (1.626 m), weight 77.1 kg, SpO2 100 %.  PHYSICAL EXAMINATION:  Physical Exam  GENERAL:  85 y.o.-year-old patient lying in the bed with no acute distress.  EYES: Pupils equal, round, reactive to light and accommodation. No scleral icterus. Extraocular muscles intact.  HEENT: Head atraumatic, normocephalic. Oropharynx and nasopharynx clear.  NECK:  Supple, no jugular venous distention. No thyroid enlargement, no tenderness.  LUNGS: Normal breath sounds bilaterally, no wheezing, rales,rhonchi or crepitation. No use of accessory muscles of respiration.  CARDIOVASCULAR: S1, S2 normal. No murmurs, rubs, or gallops.  ABDOMEN: Soft, nontender, nondistended. Bowel sounds present. No organomegaly or mass.  EXTREMITIES: No pedal edema, cyanosis, or clubbing.  NEUROLOGIC: Cranial nerves II through XII are intact. Muscle strength 5/5 in all extremities. Sensation intact. Gait not checked.  PSYCHIATRIC: The patient is alert and oriented x 3.  But looks confused and slow communication. SKIN: No obvious rash, lesion, or ulcer.   LABORATORY PANEL:   CBC Recent Labs  Lab 08/26/18 0711  WBC 8.5  HGB 14.8  HCT 44.9  PLT 185   ------------------------------------------------------------------------------------------------------------------  Chemistries  Recent Labs  Lab 08/26/18 0711  NA 138  K 4.3  CL 100  CO2 27  GLUCOSE 106*  BUN 19  CREATININE 0.92  CALCIUM 9.2  AST 39  ALT 21  ALKPHOS 50  BILITOT 1.4*   ------------------------------------------------------------------------------------------------------------------  Cardiac Enzymes No results for input(s): TROPONINI in the last 168 hours. ------------------------------------------------------------------------------------------------------------------  RADIOLOGY:  Dg Chest Portable 1 View  Result Date: 08/26/2018 CLINICAL DATA:  New fever. Smoker. EXAM: PORTABLE  CHEST 1 VIEW COMPARISON:  08/25/2018 FINDINGS: The cardiomediastinal silhouette is unchanged. Lung volumes remain low. There is mild chronic eventration of the right hemidiaphragm. There is mild right basilar opacity. The left lung is clear. No sizable pleural effusion or pneumothorax is identified. IMPRESSION: Low lung volumes with mild right basilar opacity which may reflect atelectasis or infection. Electronically Signed   By: Logan Bores M.D.   On: 08/26/2018 07:51   Dg Chest Portable 1 View  Result Date: 08/25/2018 CLINICAL DATA:  Cough.  Suspected aspiration. EXAM: PORTABLE CHEST 1 VIEW COMPARISON:  08/22/2018 FINDINGS: The heart size and mediastinal contours are within normal limits. Eventration of right hemidiaphragm is again noted. No evidence of pulmonary infiltrate or edema. No evidence of pleural effusion. IMPRESSION: No active disease. Electronically Signed   By: Earle Gell M.D.   On: 08/25/2018 10:59      IMPRESSION AND PLAN:   Pneumonia, possible aspiration pneumonia. The patient will be admitted to medical floor. He is treated with Zithromax and Rocephin in the ED, changed to cefepime and vancomycin IV, follow-up CBC and cultures.  Robitussin as needed.  Albuterol as needed.  Hypertension.  Continue home hypertension medication. History of TIA.  Continue Lipitor, and aspirin. History of seizure disorder.  Continue home medication. Schizophrenia.  Continue home medication per psych physician. Tobacco abuse.  Smoking cessation was counseled for 3 to  4 minutes.  Nicotine patch.  I called the patient's legal guardian Damian Leavellhomas, [place].  But nobody answered the phone. All the records are reviewed and case discussed with ED provider. Management plans discussed with the patient, family and they are in agreement.  CODE STATUS: Full code.  TOTAL TIME TAKING CARE OF THIS PATIENT: 46 minutes.    Shaune PollackQing [place] M.D on 08/26/2018 at 9:09 AM  Between 7am to 6pm - Pager - [phone]   After 6pm go to [email or web address] - Social research officer, governmentpassword [email or web address] [email or web address]  [facility]  Office  [phone]  CC: Primary care physician; Patient, No Pcp Per   Note: This dictation was prepared with Dragon dictation along with smaller phrase technology. Any transcriptional errors that result from this process are unin

## 2018-08-26 NOTE — ED Provider Notes (Signed)
Assumed care from Dr. Beather Arbour at 7 AM. Briefly, the patient is a 85 y.o. male with PMHx of  has a past medical history of Hypertension, Schizophrenia ([place]), Seizures ([place]), and TIA (transient ischemic attack). here with need for geripsych placement. On Keflex for UTI. Pt was awaiting geripsych bed but now is febrile. Plan to repeat septic work-up, covid now that pt is febrile.   Labs Reviewed  URINE CULTURE - Abnormal; Notable for the following components:      Result Value   Culture   (*)    Value: <10,000 COLONIES/mL INSIGNIFICANT GROWTH Performed at [facility], [place] [address] [place], [place] [address]    All other components within normal limits  COMPREHENSIVE METABOLIC PANEL - Abnormal; Notable for the following components:   Potassium 3.3 (*)    All other components within normal limits  URINALYSIS, COMPLETE (UACMP) WITH MICROSCOPIC - Abnormal; Notable for the following components:   Color, Urine AMBER (*)    APPearance HAZY (*)    Ketones, ur 20 (*)    Protein, ur 30 (*)    Bacteria, UA RARE (*)    All other components within normal limits  GLUCOSE, CAPILLARY - Abnormal; Notable for the following components:   Glucose-Capillary 125 (*)    All other components within normal limits  GLUCOSE, CAPILLARY - Abnormal; Notable for the following components:   Glucose-Capillary 113 (*)    All other components within normal limits  BASIC METABOLIC PANEL - Abnormal; Notable for the following components:   Glucose, Bld 213 (*)    GFR calc non Af Amer 58 (*)    All other components within normal limits  SARS CORONAVIRUS 2 (HOSPITAL ORDER, [place] [facility])  CULTURE, BLOOD (ROUTINE X 2)  CULTURE, BLOOD (ROUTINE X 2)  SARS CORONAVIRUS 2 (HOSPITAL ORDER, [place] [facility])  CBC WITH DIFFERENTIAL/PLATELET  ETHANOL  CBC  URINE DRUG SCREEN, QUALITATIVE ([facility] ONLY)  CBC WITH DIFFERENTIAL/PLATELET  COMPREHENSIVE METABOLIC PANEL  LACTIC ACID,  PLASMA  LACTIC ACID, PLASMA  CBG MONITORING, ED    Course of Care: -Repeat imaging shows developing infiltrate/PNA. COVID is pending. Given age, poor PO intake, persistent AMS, will admit for IV ABX.     Duffy Bruce, MD 08/26/18 [phone]

## 2018-08-26 NOTE — ED Provider Notes (Signed)
-----------------------------------------   6:24 AM on 08/26/2018 -----------------------------------------   Blood pressure (!) 145/67, pulse 84, temperature 99.1 F (37.3 C), temperature source Oral, resp. rate 15, height 5\' 4"  (1.626 m), weight 77.1 kg, SpO2 100 %.  The patient is sleeping at this time.  There have been no acute events since the last update.  Awaiting disposition plan from Behavioral Medicine team.   Paulette Blanch, MD 08/26/18 [phone]

## 2018-08-26 NOTE — Consult Note (Signed)
[facility] Consult   Reason for Consult: Altered mental status Referring Physician: Dr. Fanny BienQuale Patient Identification: Timothy [name] MRN:  [id number] Principal Diagnosis: Schizophrenia Diagnosis: Schizophrenia  Total Time spent with patient: 15 minutes   Patient is seen, chart is reviewed.  Patient has experienced cognitive decline in the ED despite being treated for his UTI.  Then, pneumonia was identified and transferred to the medical floor.  Today, he is sitting up with the sitter feeding him.  Smiled on assessment and responded "I sure am" when stated it looked like he was feeling better.  Sitter reports he talks a little then goes back to sleep.  Will continue to follow, improvement noted today.  Voices no pain or discomfort.    Per initial psychiatric intake from nurse practitioner overnight: Subjective: Patient does not say much but No during his bath.  Eating when fed, minimal movement in bed.  History of catatonia, confounded with dementia and living in a skilled nursing facility for a few years. Timothy [name] is a 85 y.o. male patient [facility] via [facility]. Per EMS discussion with triage nurse the patient lives in a group home and has a history of PTSD and at times tends to experience flashbacks that usually precipitate into these events.  This provider try reaching out to the patient guardian Ms. Damian LeavellVonde Thomas [phone]; [phone] and was unable to make contact with her  The patient was seen face-to-face by this provider; chart reviewed and consulted with Dr. Fanny BienQuale on 08/21/2018 due to the care of the patient. It was discussed with the provider that the patient does meet criteria to be admitted to the a geriatric psychiatric inpatient unit for treatment and stabilization. On evaluation the patient is alert to self only, uncooperative, and mood-congruent with affect.  The patient is exhibiting internal stimuli and presenting with some hallucination and  delusional thinking. This provider is unable to assess if the patient is experiencing visual hallucinations, suicidal, homicidal, or self-harm ideations. The patient is presenting with  psychotic behaviors. During an encounter with the patient, he was not able to answer questions appropriately. Collateral information was not obtained from This provider try reaching out to the patient guardian Ms. Damian LeavellVonde Thomas [phone]; [phone] and was unable to make contact with her Plan: The patient is a safety risk to self and others due to his aggression and does  require psychiatric inpatient admission for stabilization and treatment.  HPI:  Per Dr. Fanny BienQuale; Timothy [name] is a 85 y.o. male history of schizophrenia, seizures, TIAs, A. fib, stroke, and behavioral disturbance EMS reports the patient was at his group home, he became agitated had "tore up" his room and was acting very agitated and aggressive prompting police and EMS call.  Group home reported concerned that this was due to his mental illness and flashbacks from [place] with previous history of similar in the past.  Past Psychiatric History: Schizophrenia (HCC)  Risk to Self:  Yes Risk to Others:  Yes Prior Inpatient Therapy:  Yes Prior Outpatient Therapy:  Yes  Past Medical History:  Past Medical History:  Diagnosis Date  . Hypertension   . Schizophrenia (HCC)   . Seizures (HCC)   . TIA (transient ischemic attack)    History reviewed. No pertinent surgical history. Family History: History reviewed. No pertinent family history. Family Psychiatric  History: None indicated  Social History:  Social History   Substance and Sexual Activity  Alcohol Use No  . Alcohol/week: 0.0 standard drinks  Social History   Substance and Sexual Activity  Drug Use No    Social History   Socioeconomic History  . Marital status: Widowed    Spouse name: Not on file  . Number of children: Not on file  . Years of education: Not on file  .  Highest education level: Not on file  Occupational History  . Not on file  Social Needs  . Financial resource strain: Not on file  . Food insecurity    Worry: Not on file    Inability: Not on file  . Transportation needs    Medical: Not on file    Non-medical: Not on file  Tobacco Use  . Smoking status: Current Every Day Smoker    Packs/day: 0.25    Years: 25.00    Pack years: 6.25    Types: Cigarettes  . Smokeless tobacco: Never Used  Substance and Sexual Activity  . Alcohol use: No    Alcohol/week: 0.0 standard drinks  . Drug use: No  . Sexual activity: Not Currently  Lifestyle  . Physical activity    Days per week: Not on file    Minutes per session: Not on file  . Stress: Not on file  Relationships  . Social Musicianconnections    Talks on phone: Not on file    Gets together: Not on file    Attends religious service: Not on file    Active member of club or organization: Not on file    Attends meetings of clubs or organizations: Not on file    Relationship status: Not on file  Other Topics Concern  . Not on file  Social History Narrative  . Not on file   Additional Social History:    Allergies:  No Known Allergies  Labs:  Results for orders placed or performed during the hospital encounter of 08/21/18 (from the past 48 hour(s))  Glucose, capillary     Status: Abnormal   Collection Time: 08/25/18  8:38 AM  Result Value Ref Range   Glucose-Capillary 113 (H) 70 - 99 mg/dL   Comment 1 Notify RN    Comment 2 Document in Chart   Basic metabolic panel     Status: Abnormal   Collection Time: 08/25/18  9:46 AM  Result Value Ref Range   Sodium 139 135 - 145 mmol/L   Potassium 3.6 3.5 - 5.1 mmol/L   Chloride 101 98 - 111 mmol/L   CO2 29 22 - 32 mmol/L   Glucose, Bld 213 (H) 70 - 99 mg/dL   BUN 18 8 - 23 mg/dL   Creatinine, Ser 1.611.16 0.61 - 1.24 mg/dL   Calcium 9.2 8.9 - 09.610.3 mg/dL   GFR calc non Af Amer 58 (L) >60 mL/min   GFR calc Af Amer >60 >60 mL/min   Anion gap  9 5 - 15    Comment: Performed at [facility], [address] [place], [place] [address]  CBC     Status: None   Collection Time: 08/25/18  9:46 AM  Result Value Ref Range   WBC 9.1 4.0 - 10.5 K/uL   RBC 5.62 4.22 - 5.81 MIL/uL   Hemoglobin 15.4 13.0 - 17.0 g/dL   HCT 98.146.9 19.139.0 - 47.852.0 %   MCV 83.5 80.0 - 100.0 fL   MCH 27.4 26.0 - 34.0 pg   MCHC 32.8 30.0 - 36.0 g/dL   RDW 29.513.9 62.111.5 - 30.815.5 %   Platelets 175 150 - 400 K/uL  nRBC 0.0 0.0 - 0.2 %    Comment: Performed at [facility], [address] [place], [place] [address]  CBC with Differential     Status: Abnormal   Collection Time: 08/26/18  7:11 AM  Result Value Ref Range   WBC 8.5 4.0 - 10.5 K/uL   RBC 5.37 4.22 - 5.81 MIL/uL   Hemoglobin 14.8 13.0 - 17.0 g/dL   HCT 60.444.9 54.039.0 - 98.152.0 %   MCV 83.6 80.0 - 100.0 fL   MCH 27.6 26.0 - 34.0 pg   MCHC 33.0 30.0 - 36.0 g/dL   RDW 19.113.9 47.811.5 - 29.515.5 %   Platelets 185 150 - 400 K/uL   nRBC 0.0 0.0 - 0.2 %   Neutrophils Relative % 73 %   Neutro Abs 6.3 1.7 - 7.7 K/uL   Lymphocytes Relative 9 %   Lymphs Abs 0.7 0.7 - 4.0 K/uL   Monocytes Relative 15 %   Monocytes Absolute 1.3 (H) 0.1 - 1.0 K/uL   Eosinophils Relative 2 %   Eosinophils Absolute 0.1 0.0 - 0.5 K/uL   Basophils Relative 0 %   Basophils Absolute 0.0 0.0 - 0.1 K/uL   Immature Granulocytes 1 %   Abs Immature Granulocytes 0.04 0.00 - 0.07 K/uL    Comment: Performed at [facility], [address] [place], [place] [address]  Comprehensive metabolic panel     Status: Abnormal   Collection Time: 08/26/18  7:11 AM  Result Value Ref Range   Sodium 138 135 - 145 mmol/L   Potassium 4.3 3.5 - 5.1 mmol/L    Comment: HEMOLYSIS AT THIS LEVEL MAY AFFECT RESULT   Chloride 100 98 - 111 mmol/L   CO2 27 22 - 32 mmol/L   Glucose, Bld 106 (H) 70 - 99 mg/dL   BUN 19 8 - 23 mg/dL   Creatinine, Ser 8.650.92 0.61 - 1.24 mg/dL   Calcium 9.2 8.9 - 78.410.3 mg/dL   Total Protein 7.4 6.5 - 8.1 g/dL   Albumin 3.6  3.5 - 5.0 g/dL   AST 39 15 - 41 U/L    Comment: HEMOLYSIS AT THIS LEVEL MAY AFFECT RESULT   ALT 21 0 - 44 U/L   Alkaline Phosphatase 50 38 - 126 U/L   Total Bilirubin 1.4 (H) 0.3 - 1.2 mg/dL    Comment: HEMOLYSIS AT THIS LEVEL MAY AFFECT RESULT   GFR calc non Af Amer >60 >60 mL/min   GFR calc Af Amer >60 >60 mL/min   Anion gap 11 5 - 15    Comment: Performed at [facility], [address] [place], [place] [address]  Lactic acid, plasma     Status: None   Collection Time: 08/26/18  7:11 AM  Result Value Ref Range   Lactic Acid, Venous 1.1 0.5 - 1.9 mmol/L    Comment: Performed at [facility], [address] [place], [place] [address]  SARS Coronavirus 2 (CEPHEID - Performed in [facility] hospital lab), Hosp Order     Status: None   Collection Time: 08/26/18  7:37 AM   Specimen: Nasopharyngeal Swab  Result Value Ref Range   SARS Coronavirus 2 NEGATIVE NEGATIVE    Comment: (NOTE) If result is NEGATIVE SARS-CoV-2 target nucleic acids are NOT DETECTED. The SARS-CoV-2 RNA is generally detectable in upper and lower  respiratory specimens during the acute phase of infection. The lowest  concentration of SARS-CoV-2 viral copies this assay can detect is 250  copies / mL.  A negative result does not preclude SARS-CoV-2 infection  and should not be used as the sole basis for treatment or other  patient management decisions.  A negative result may occur with  improper specimen collection / handling, submission of specimen other  than nasopharyngeal swab, presence of viral mutation(s) within the  areas targeted by this assay, and inadequate number of viral copies  (<250 copies / mL). A negative result must be combined with clinical  observations, patient history, and epidemiological information. If result is POSITIVE SARS-CoV-2 target nucleic acids are DETECTED. The SARS-CoV-2 RNA is generally detectable in upper and lower  respiratory specimens dur ing the acute  phase of infection.  Positive  results are indicative of active infection with SARS-CoV-2.  Clinical  correlation with patient history and other diagnostic information is  necessary to determine patient infection status.  Positive results do  not rule out bacterial infection or co-infection with other viruses. If result is PRESUMPTIVE POSTIVE SARS-CoV-2 nucleic acids MAY BE PRESENT.   A presumptive positive result was obtained on the submitted specimen  and confirmed on repeat testing.  While 2019 novel coronavirus  (SARS-CoV-2) nucleic acids may be present in the submitted sample  additional confirmatory testing may be necessary for epidemiological  and / or clinical management purposes  to differentiate between  SARS-CoV-2 and other Sarbecovirus currently known to infect humans.  If clinically indicated additional testing with an alternate test  methodology [phone]) is advised. The SARS-CoV-2 RNA is generally  detectable in upper and lower respiratory sp ecimens during the acute  phase of infection. The expected result is Negative. Fact Sheet for Patients:  BoilerBrush.com.cy Fact Sheet for Healthcare Providers: https://pope.com/ This test is not yet approved or cleared by the [place] FDA and has been authorized for detection and/or diagnosis of SARS-CoV-2 by FDA under an Emergency Use Authorization (EUA).  This EUA will remain in effect (meaning this test can be used) for the duration of the COVID-19 declaration under Section 564(b)(1) of the Act, 21 U.S.C. section 360bbb-3(b)(1), unless the authorization is terminated or revoked sooner. Performed at [facility], [address] [place], [place] [address]   MRSA PCR Screening     Status: None   Collection Time: 08/26/18 11:36 AM   Specimen: Nasal Mucosa; Nasopharyngeal  Result Value Ref Range   MRSA by PCR NEGATIVE NEGATIVE    Comment:        The GeneXpert MRSA  Assay (FDA approved for NASAL specimens only), is one component of a comprehensive MRSA colonization surveillance program. It is not intended to diagnose MRSA infection nor to guide or monitor treatment for MRSA infections. Performed at [facility], [address] [place], [place] [address]   Urinalysis, Complete w Microscopic     Status: Abnormal   Collection Time: 08/26/18  2:50 PM  Result Value Ref Range   Color, Urine AMBER (A) YELLOW    Comment: BIOCHEMICALS MAY BE AFFECTED BY COLOR   APPearance HAZY (A) CLEAR   Specific Gravity, Urine 1.032 (H) 1.005 - 1.030   pH 5.0 5.0 - 8.0   Glucose, UA NEGATIVE NEGATIVE mg/dL   Hgb urine dipstick NEGATIVE NEGATIVE   Bilirubin Urine NEGATIVE NEGATIVE   Ketones, ur NEGATIVE NEGATIVE mg/dL   Protein, ur 30 (A) NEGATIVE mg/dL   Nitrite NEGATIVE NEGATIVE   Leukocytes,Ua NEGATIVE NEGATIVE   RBC / HPF 0-5 0 - 5 RBC/hpf   WBC, UA 0-5 0 - 5 WBC/hpf   Bacteria, UA  NONE SEEN NONE SEEN   Squamous Epithelial / LPF 0-5 0 - 5   Mucus PRESENT     Comment: Performed at [facility], [address] [place], [place] [address]    Current Facility-Administered Medications  Medication Dose Route Frequency Provider Last Rate Last Dose  . 0.9 %  sodium chloride infusion  250 mL Intravenous PRN Demetrios Loll, MD 10 mL/hr at 08/26/18 1131    . acetaminophen (TYLENOL) tablet 650 mg  650 mg Oral Q6H PRN Demetrios Loll, MD       Or  . acetaminophen (TYLENOL) suppository 650 mg  650 mg Rectal Q6H PRN Demetrios Loll, MD      . albuterol (PROVENTIL) (2.5 MG/3ML) 0.083% nebulizer solution 2.5 mg  2.5 mg Inhalation Q4H PRN Demetrios Loll, MD      . amLODipine (NORVASC) tablet 10 mg  10 mg Oral Daily Hinda Kehr, MD   10 mg at 08/26/18 1105  . aspirin chewable tablet 81 mg  81 mg Oral Daily Demetrios Loll, MD   81 mg at 08/26/18 1103  . atorvastatin (LIPITOR) tablet 10 mg  10 mg Oral Daily Hinda Kehr, MD   10 mg at 08/26/18 1106  . bisacodyl (DULCOLAX)  EC tablet 5 mg  5 mg Oral Daily PRN Demetrios Loll, MD      . ceFEPIme (MAXIPIME) 2 g in sodium chloride 0.9 % 100 mL IVPB  2 g Intravenous Q12H Demetrios Loll, MD      . enoxaparin (LOVENOX) injection 40 mg  40 mg Subcutaneous Q24H Demetrios Loll, MD   40 mg at 08/26/18 1113  . guaiFENesin (ROBITUSSIN) 100 MG/5ML solution 100 mg  5 mL Oral Q4H PRN Demetrios Loll, MD      . HYDROcodone-acetaminophen (NORCO/VICODIN) 5-325 MG per tablet 1-2 tablet  1-2 tablet Oral Q4H PRN Demetrios Loll, MD      . lamoTRIgine (LAMICTAL) tablet 150 mg  150 mg Oral BID Hinda Kehr, MD   150 mg at 08/26/18 1105  . memantine (NAMENDA) tablet 5 mg  5 mg Oral Daily Hinda Kehr, MD   5 mg at 08/26/18 1105  . nicotine (NICODERM CQ - dosed in mg/24 hours) patch 14 mg  14 mg Transdermal Daily Demetrios Loll, MD   14 mg at 08/26/18 1112  . OLANZapine (ZYPREXA) tablet 20 mg  20 mg Oral QHS Hinda Kehr, MD   20 mg at 08/25/18 2353  . ondansetron (ZOFRAN) tablet 4 mg  4 mg Oral Q6H PRN Demetrios Loll, MD       Or  . ondansetron [facility]) injection 4 mg  4 mg Intravenous Q6H PRN Demetrios Loll, MD      . senna-docusate (Senokot-S) tablet 1 tablet  1 tablet Oral QHS PRN Demetrios Loll, MD      . sodium chloride flush (NS) 0.9 % injection 3 mL  3 mL Intravenous Q12H Demetrios Loll, MD   3 mL at 08/26/18 1114  . sodium chloride flush (NS) 0.9 % injection 3 mL  3 mL Intravenous PRN Demetrios Loll, MD      . vitamin B-12 (CYANOCOBALAMIN) tablet 1,000 mcg  1,000 mcg Oral Daily Hinda Kehr, MD   1,000 mcg at 08/26/18 1106    Musculoskeletal: Strength & Muscle Tone: within normal limits Gait & Station: unsteady Patient leans: Backward  Psychiatric Specialty Exam: Physical Exam  Nursing note and vitals reviewed. Constitutional: He appears well-developed and well-nourished. No distress.  HENT:  Head: Normocephalic and atraumatic.  Eyes: Pupils are  equal, round, and reactive to light.  Neck: Normal range of motion.  Cardiovascular: Normal rate and regular rhythm.   Respiratory: Effort normal. No respiratory distress.  Musculoskeletal: Normal range of motion.  Neurological: He is alert.  Psychiatric: His speech is normal and behavior is normal. Thought content normal. His affect is blunt. Cognition and memory are impaired. He expresses impulsivity.    Review of Systems  Unable to perform ROS: Psychiatric disorder  Musculoskeletal: Positive for joint pain.  Psychiatric/Behavioral: Positive for memory loss. The patient has insomnia.     Blood pressure 125/71, pulse 75, temperature 98.3 F (36.8 C), temperature source Axillary, resp. rate 20, height  (1.93 m), weight 71.6 kg, SpO2 97 %.Body mass index is 19.21 kg/m.  General Appearance: Casual  Eye Contact:  None  Speech:  Clear and coherent  Volume:  Normal  Mood:  Euthymic  Affect:  Congruent  Thought Process:  Logical  Orientation:  Other:  Self  Thought Content:  WDL  Suicidal Thoughts:  No  Homicidal Thoughts:  No  Memory:  Poor  Judgement:  Fair  Insight:  Lacking  Psychomotor Activity:  Decreased  Concentration:  Concentration: Poor and Attention Span: Poor  Recall:  Poor  Fund of Knowledge:  Fair  Language:  Fair  Akathisia:  NA  Handed:  Right  AIMS (if indicated):     Assets:  Physical Health Social Support  ADL's:  Impaired  Cognition:  Impaired--moderate to severe  Sleep:   Patient does not state     Treatment Plan Summary: Continue involuntary commitment Daily contact with patient to assess and evaluate symptoms and progress in treatment and Medication management  Dementia with behavioral disturbances: -Continued Namenda 5 mg daily -Seeking geriatric psych  Schizophrenia: -Continued Zyprexa 20 mg at bedtime -Continued Lamictal 150 mg BID  Disposition: Supportive therapy provided about ongoing stressors. Patient does meet criteria for geriatric psychiatric inpatient admission for stabilization and treatment.  Nanine Means, NP 08/26/2018 3:50 PM

## 2018-08-26 NOTE — Consult Note (Signed)
Pharmacy Antibiotic Note  Timothy [name] is a 85 y.o. male admitted on 08/21/2018 with pneumonia.  Pharmacy has been consulted for vancomycin dosing.  Plan: Will give a vancomycin 1750 mg x 1 loading dose followed by 1250 mg q24H. Predicted AUC 482, goal AUC is 400-550. Scr used 0.92. Renal functions seems stable. Css min 11.7. Plan to order levels in 4-5 days. Will order MRSA PCR if negative recommend to d/c vancomycin   Will order cefepime 2 g q12H.   Height: 5\' 4"  (162.6 cm) Weight: 170 lb (77.1 kg) IBW/kg (Calculated) : 59.2  Temp (24hrs), Avg:100.7 F (38.2 C), Min:100.7 F (38.2 C), Max:100.7 F (38.2 C)  Recent Labs  Lab 08/21/18 1726 08/25/18 0946 08/26/18 0711  WBC 4.5 9.1 8.5  CREATININE 1.02 1.16 0.92  LATICACIDVEN  --   --  1.1    Estimated Creatinine Clearance: 57.1 mL/min (by C-G formula based on SCr of 0.92 mg/dL).    No Known Allergies  Antimicrobials this admission: 6/14 cefepime >>  6/14 vancomycin >>  6/14 azithromycin >>  6/14 CTX x 1   Dose adjustments this admission: None  Microbiology results: 6/14 BCx: pending 6/14 MRSA PCR: ordered  Thank you for allowing pharmacy to be a part of this patient's care.  Oswald Hillock, PharmD, BCPS 08/26/2018 9:15 AM

## 2018-08-27 DIAGNOSIS — J690 Pneumonitis due to inhalation of food and vomit: Secondary | ICD-10-CM

## 2018-08-27 DIAGNOSIS — F203 Undifferentiated schizophrenia: Principal | ICD-10-CM

## 2018-08-27 DIAGNOSIS — F0391 Unspecified dementia with behavioral disturbance: Secondary | ICD-10-CM

## 2018-08-27 LAB — CBC
HCT: 39.3 % (ref 39.0–52.0)
Hemoglobin: 12.9 g/dL — ABNORMAL LOW (ref 13.0–17.0)
MCH: 27.6 pg (ref 26.0–34.0)
MCHC: 32.8 g/dL (ref 30.0–36.0)
MCV: 84 fL (ref 80.0–100.0)
Platelets: 195 10*3/uL (ref 150–400)
RBC: 4.68 MIL/uL (ref 4.22–5.81)
RDW: 13.9 % (ref 11.5–15.5)
WBC: 6 10*3/uL (ref 4.0–10.5)
nRBC: 0 % (ref 0.0–0.2)

## 2018-08-27 LAB — BASIC METABOLIC PANEL
Anion gap: 8 (ref 5–15)
BUN: 16 mg/dL (ref 8–23)
CO2: 26 mmol/L (ref 22–32)
Calcium: 8.5 mg/dL — ABNORMAL LOW (ref 8.9–10.3)
Chloride: 105 mmol/L (ref 98–111)
Creatinine, Ser: 0.92 mg/dL (ref 0.61–1.24)
GFR calc Af Amer: >60 mL/min (ref >60)
GFR calc non Af Amer: >60 mL/min (ref >60)
Glucose, Bld: 119 mg/dL — ABNORMAL HIGH (ref 70–99)
Potassium: 3.7 mmol/L (ref 3.5–5.1)
Sodium: 139 mmol/L (ref 135–145)

## 2018-08-27 MED ORDER — ADULT MULTIVITAMIN W/MINERALS CH
1.0000 | ORAL_TABLET | Freq: Every day | ORAL | Status: DC
  Administered 2018-08-27 – 2018-09-03 (×8): 1 via ORAL
  Filled 2018-08-27 (×8): qty 1

## 2018-08-27 MED ORDER — ENSURE ENLIVE PO LIQD
237.0000 mL | Freq: Two times a day (BID) | ORAL | Status: DC
  Administered 2018-08-27: 10:00:00 237 mL via ORAL

## 2018-08-27 NOTE — Progress Notes (Signed)
Speech Therapy Note: reviewed pt's chart notes; noting toleration of diet post modifying the liquids to Nectar consistency(and Minced foods) on Saturday - NSG had noted coughing w/ thin liquids despite precautions.  Due to recent diets/consistencies ordered and pt's declined medical and Cognitive status', recommend a MBSS tomorrow b/f any further diet upgrade in order to determine degree of dysphagia and need to modify diet. Recommend strict aspiration precautions and assistance at all meals. NSG updated. ST will f/u tomorrow.     Orinda Kenner, [place], CCC-SLP

## 2018-08-27 NOTE — Progress Notes (Addendum)
Initial Nutrition Assessment  RD working remotely.  DOCUMENTATION CODES:   Not applicable  INTERVENTION:  Provide Hormel Shake (Vital Cuisine) po TID with trays, each supplement provides 520 kcal and 22 grams of protein.  Provide daily MVI.  NUTRITION DIAGNOSIS:   Inadequate oral intake related to poor appetite as evidenced by (per chart).  GOAL:   Patient will meet greater than or equal to 90% of their needs  MONITOR:   PO intake, Supplement acceptance, Labs, Weight trends, I & O's  REASON FOR ASSESSMENT:   Malnutrition Screening Tool    ASSESSMENT:   85 year old male with PMHx of schizophrenia, seizures, hx TIA, HTN who has been in ED for geriatric psych placement now admitted with PNA.   Patient unable to provide history at this time. Per RN note patient has a poor appetite and needs total assist with meals. Also noted in chart patient's typical diet is mechanical soft, so he would benefit from being downgraded to this diet. Per weight history in chart patient has been weight-stable since at least 2016. Patient is at risk for malnutrition. Following SLP evaluation patient was downgraded to dysphagia 2 with nectar-thick liquids.  Medications reviewed and include: nicotine patch, vitamin B12 1000 micrograms daily, cefepime.  Labs reviewed.  NUTRITION - FOCUSED PHYSICAL EXAM:  Unable to complete at this time.  Diet Order:   Diet Order            DIET DYS 2 Room service appropriate? Yes with Assist; Fluid consistency: Nectar Thick  Diet effective now             EDUCATION NEEDS:   No education needs have been identified at this time  Skin:  Skin Assessment: Reviewed RN Assessment  Last BM:  Unknown  Height:   Ht Readings from Last 1 Encounters:  08/26/18 6\' 4"  (1.93 m)   Weight:   Wt Readings from Last 1 Encounters:  08/26/18 71.6 kg   Ideal Body Weight:  91.8 kg  BMI:  Body mass index is 19.21 kg/m.  Estimated Nutritional Needs:   Kcal:   1800-2000  Protein:  90-100 grams  Fluid:  1.8-2 L/day  Willey Blade, MS, RD, LDN Office: [phone] Pager: [phone] After Hours/Weekend Pager: [phone]

## 2018-08-27 NOTE — TOC Progression Note (Addendum)
Transition of Care [facility]) - Progression Note    Patient Details  Name: Timothy [name] MRN: [id number] Date of Birth: May 05, 1932  Transition of Care [facility]) CM/SW Contact  Olivine Hiers, Lenice Llamas Phone Number: [phone]  08/27/2018, 4:17 PM  Clinical Narrative:   Clinical Social Worker (CSW) contacted Librarian, academic and spoke to admissions RN Caryl Pina. Per Caryl Pina they don't have a bed for patient at this time due to some of their discharges getting cancelled.     Expected Discharge Plan: Psychiatric Hospital Barriers to Discharge: Continued Medical Work up, Psych Bed not available  Expected Discharge Plan and Services Expected Discharge Plan: Psychiatric Hospital In-house Referral: Clinical Social Work Discharge Planning Services: CM Consult   Living arrangements for the past 2 months: Group Home                                       Social Determinants of Health (SDOH) Interventions    Readmission Risk Interventions No flowsheet data found.

## 2018-08-27 NOTE — Consult Note (Addendum)
[facility] Face-to-Face Psychiatry Consult   Reason for Consult: Altered mental status Referring Physician: Dr. Fanny BienQuale Patient Identification: Timothy [name] MRN:  [id number] Principal Diagnosis: Schizophrenia Diagnosis: Schizophrenia  Total Time spent with patient: 45 minutes   Patient is seen, chart is reviewed.  Patient has experienced cognitive decline in the ED despite being treated for his UTI.  Then, pneumonia was identified and transferred to the medical floor.    Per initial psychiatric intake: Subjective: Patient does not say much but No during his bath.  Eating when fed, minimal movement in bed.  History of catatonia, confounded with dementia and living in a skilled nursing facility for a few years. Timothy [name] is a 85 y.o. male patient [facility] ED via [facility]. Per EMS discussion with triage nurse the patient lives in a group home and has a history of PTSD and at times tends to experience flashbacks that usually precipitate into these events.  This provider try reaching out to the patient guardian Ms. Damian LeavellVonde Thomas [phone]; [phone] and was unable to make contact with her  The patient was seen face-to-face by this provider; chart reviewed and consulted with Dr. Fanny BienQuale on 08/21/2018 due to the care of the patient. It was discussed with the provider that the patient does meet criteria to be admitted to the a geriatric psychiatric inpatient unit for treatment and stabilization. On evaluation the patient is alert to self only, uncooperative, and mood-congruent with affect.  The patient is exhibiting internal stimuli and presenting with some hallucination and delusional thinking. This provider is unable to assess if the patient is experiencing visual hallucinations, suicidal, homicidal, or self-harm ideations. The patient is presenting with  psychotic behaviors. During an encounter with the patient, he was not able to answer questions appropriately. Collateral information was not  obtained from This provider try reaching out to the patient guardian Ms. Damian LeavellVonde Thomas [phone]; [phone] and was unable to make contact with her Plan: The patient is a safety risk to self and others due to his aggression and does  require psychiatric inpatient admission for stabilization and treatment.  HPI:  Per Dr. Fanny BienQuale; Timothy Aaselliott [name] is a 85 y.o. male history of schizophrenia, seizures, TIAs, A. fib, stroke, and behavioral disturbance EMS reports the patient was at his group home, he became agitated had "tore up" his room and was acting very agitated and aggressive prompting police and EMS call.  Group home reported concerned that this was due to his mental illness and flashbacks from [place] with previous history of similar in the past.  Past Psychiatric History: Schizophrenia (HCC)  On evaluation today, patient is sitting upright in bed.  He smiles and states he is feeling good.  He denies SI, HI, AVH.  Patient is aware of self and surroundings.  He does know the year, and is able to state the month and date with prompts.  Patient has good recall of remote history.  His recent memory is poor.  Patient describes he is feeling weak.  Sitter at bedside states he required 2 person assist in order to sit at bedside.  She is unaware physical therapy has been working with patient.  He is being evaluated by speech therapy for swallow.  Patient denies dysarthria or jaw stiffness.  No tardive dyskinesia noted.  Patient moves all extremities and does not report any stiffness.  No cogwheel rigidity noted on exam.  Risk to Self:  No Risk to Others:  No Prior Inpatient Therapy:  Yes Prior Outpatient Therapy:  Yes  Past Medical History:  Past Medical History:  Diagnosis Date  . Hypertension   . Schizophrenia (HCC)   . Seizures (HCC)   . TIA (transient ischemic attack)    History reviewed. No pertinent surgical history. Family History: History reviewed. No pertinent family history. Family  Psychiatric  History: None indicated  Social History:  Social History   Substance and Sexual Activity  Alcohol Use No  . Alcohol/week: 0.0 standard drinks     Social History   Substance and Sexual Activity  Drug Use No    Social History   Socioeconomic History  . Marital status: Widowed    Spouse name: Not on file  . Number of children: Not on file  . Years of education: Not on file  . Highest education level: Not on file  Occupational History  . Not on file  Social Needs  . Financial resource strain: Not on file  . Food insecurity    Worry: Not on file    Inability: Not on file  . Transportation needs    Medical: Not on file    Non-medical: Not on file  Tobacco Use  . Smoking status: Current Every Day Smoker    Packs/day: 0.25    Years: 25.00    Pack years: 6.25    Types: Cigarettes  . Smokeless tobacco: Never Used  Substance and Sexual Activity  . Alcohol use: No    Alcohol/week: 0.0 standard drinks  . Drug use: No  . Sexual activity: Not Currently  Lifestyle  . Physical activity    Days per week: Not on file    Minutes per session: Not on file  . Stress: Not on file  Relationships  . Social Musician on phone: Not on file    Gets together: Not on file    Attends religious service: Not on file    Active member of club or organization: Not on file    Attends meetings of clubs or organizations: Not on file    Relationship status: Not on file  Other Topics Concern  . Not on file  Social History Narrative  . Not on file   Additional Social History: Patient lives in group home    Allergies:  No Known Allergies  Labs:  Results for orders placed or performed during the hospital encounter of 08/21/18 (from the past 48 hour(s))  CBC with Differential     Status: Abnormal   Collection Time: 08/26/18  7:11 AM  Result Value Ref Range   WBC 8.5 4.0 - 10.5 K/uL   RBC 5.37 4.22 - 5.81 MIL/uL   Hemoglobin 14.8 13.0 - 17.0 g/dL   HCT 81.1 91.4 -  78.2 %   MCV 83.6 80.0 - 100.0 fL   MCH 27.6 26.0 - 34.0 pg   MCHC 33.0 30.0 - 36.0 g/dL   RDW 95.6 21.3 - 08.6 %   Platelets 185 150 - 400 K/uL   nRBC 0.0 0.0 - 0.2 %   Neutrophils Relative % 73 %   Neutro Abs 6.3 1.7 - 7.7 K/uL   Lymphocytes Relative 9 %   Lymphs Abs 0.7 0.7 - 4.0 K/uL   Monocytes Relative 15 %   Monocytes Absolute 1.3 (H) 0.1 - 1.0 K/uL   Eosinophils Relative 2 %   Eosinophils Absolute 0.1 0.0 - 0.5 K/uL   Basophils Relative 0 %   Basophils Absolute 0.0 0.0 - 0.1 K/uL   Immature Granulocytes 1 %  Abs Immature Granulocytes 0.04 0.00 - 0.07 K/uL    Comment: Performed at [facility], [address] [place], [place] [address]  Comprehensive metabolic panel     Status: Abnormal   Collection Time: 08/26/18  7:11 AM  Result Value Ref Range   Sodium 138 135 - 145 mmol/L   Potassium 4.3 3.5 - 5.1 mmol/L    Comment: HEMOLYSIS AT THIS LEVEL MAY AFFECT RESULT   Chloride 100 98 - 111 mmol/L   CO2 27 22 - 32 mmol/L   Glucose, Bld 106 (H) 70 - 99 mg/dL   BUN 19 8 - 23 mg/dL   Creatinine, Ser 6.040.92 0.61 - 1.24 mg/dL   Calcium 9.2 8.9 - 54.010.3 mg/dL   Total Protein 7.4 6.5 - 8.1 g/dL   Albumin 3.6 3.5 - 5.0 g/dL   AST 39 15 - 41 U/L    Comment: HEMOLYSIS AT THIS LEVEL MAY AFFECT RESULT   ALT 21 0 - 44 U/L   Alkaline Phosphatase 50 38 - 126 U/L   Total Bilirubin 1.4 (H) 0.3 - 1.2 mg/dL    Comment: HEMOLYSIS AT THIS LEVEL MAY AFFECT RESULT   GFR calc non Af Amer >60 >60 mL/min   GFR calc Af Amer >60 >60 mL/min   Anion gap 11 5 - 15    Comment: Performed at [facility], [address] [place], [place] [address]  Lactic acid, plasma     Status: None   Collection Time: 08/26/18  7:11 AM  Result Value Ref Range   Lactic Acid, Venous 1.1 0.5 - 1.9 mmol/L    Comment: Performed at [facility], [address] [place], [place] [address]  SARS Coronavirus 2 (CEPHEID - Performed in [facility] hospital lab), Hosp Order     Status: None    Collection Time: 08/26/18  7:37 AM   Specimen: Nasopharyngeal Swab  Result Value Ref Range   SARS Coronavirus 2 NEGATIVE NEGATIVE    Comment: (NOTE) If result is NEGATIVE SARS-CoV-2 target nucleic acids are NOT DETECTED. The SARS-CoV-2 RNA is generally detectable in upper and lower  respiratory specimens during the acute phase of infection. The lowest  concentration of SARS-CoV-2 viral copies this assay can detect is 250  copies / mL. A negative result does not preclude SARS-CoV-2 infection  and should not be used as the sole basis for treatment or other  patient management decisions.  A negative result may occur with  improper specimen collection / handling, submission of specimen other  than nasopharyngeal swab, presence of viral mutation(s) within the  areas targeted by this assay, and inadequate number of viral copies  (<250 copies / mL). A negative result must be combined with clinical  observations, patient history, and epidemiological information. If result is POSITIVE SARS-CoV-2 target nucleic acids are DETECTED. The SARS-CoV-2 RNA is generally detectable in upper and lower  respiratory specimens dur ing the acute phase of infection.  Positive  results are indicative of active infection with SARS-CoV-2.  Clinical  correlation with patient history and other diagnostic information is  necessary to determine patient infection status.  Positive results do  not rule out bacterial infection or co-infection with other viruses. If result is PRESUMPTIVE POSTIVE SARS-CoV-2 nucleic acids MAY BE PRESENT.   A presumptive positive result was obtained on the submitted specimen  and confirmed on repeat testing.  While 2019 novel coronavirus  (SARS-CoV-2) nucleic acids may be present in the submitted sample  additional confirmatory testing may  be necessary for epidemiological  and / or clinical management purposes  to differentiate between  SARS-CoV-2 and other Sarbecovirus currently known  to infect humans.  If clinically indicated additional testing with an alternate test  methodology [phone]([id number]) is advised. The SARS-CoV-2 RNA is generally  detectable in upper and lower respiratory sp ecimens during the acute  phase of infection. The expected result is Negative. Fact Sheet for Patients:  BoilerBrush.com.cyhttps://www.fda.gov/media/136312/download Fact Sheet for Healthcare Providers: https://pope.com/https://www.fda.gov/media/136313/download This test is not yet approved or cleared by the [place] FDA and has been authorized for detection and/or diagnosis of SARS-CoV-2 by FDA under an Emergency Use Authorization (EUA).  This EUA will remain in effect (meaning this test can be used) for the duration of the COVID-19 declaration under Section 564(b)(1) of the Act, 21 U.S.C. section 360bbb-3(b)(1), unless the authorization is terminated or revoked sooner. Performed at [facility], [address] [place], [place] [address]   MRSA PCR Screening     Status: None   Collection Time: 08/26/18 11:36 AM   Specimen: Nasal Mucosa; Nasopharyngeal  Result Value Ref Range   MRSA by PCR NEGATIVE NEGATIVE    Comment:        The GeneXpert MRSA Assay (FDA approved for NASAL specimens only), is one component of a comprehensive MRSA colonization surveillance program. It is not intended to diagnose MRSA infection nor to guide or monitor treatment for MRSA infections. Performed at [facility], [address] [place], [place] [address]   Urinalysis, Complete w Microscopic     Status: Abnormal   Collection Time: 08/26/18  2:50 PM  Result Value Ref Range   Color, Urine AMBER (A) YELLOW    Comment: BIOCHEMICALS MAY BE AFFECTED BY COLOR   APPearance HAZY (A) CLEAR   Specific Gravity, Urine 1.032 (H) 1.005 - 1.030   pH 5.0 5.0 - 8.0   Glucose, UA NEGATIVE NEGATIVE mg/dL   Hgb urine dipstick NEGATIVE NEGATIVE   Bilirubin Urine NEGATIVE NEGATIVE   Ketones, ur NEGATIVE NEGATIVE mg/dL   Protein,  ur 30 (A) NEGATIVE mg/dL   Nitrite NEGATIVE NEGATIVE   Leukocytes,Ua NEGATIVE NEGATIVE   RBC / HPF 0-5 0 - 5 RBC/hpf   WBC, UA 0-5 0 - 5 WBC/hpf   Bacteria, UA NONE SEEN NONE SEEN   Squamous Epithelial / LPF 0-5 0 - 5   Mucus PRESENT     Comment: Performed at [facility], [address] [place], [place] [address]  Strep pneumoniae urinary antigen     Status: None   Collection Time: 08/26/18  2:50 PM  Result Value Ref Range   Strep Pneumo Urinary Antigen NEGATIVE NEGATIVE    Comment:        Infection due to S. pneumoniae cannot be absolutely ruled out since the antigen present may be below the detection limit of the test. Performed at [facility], [address] [place], [place] [address]   Basic metabolic panel     Status: Abnormal   Collection Time: 08/27/18  4:32 AM  Result Value Ref Range   Sodium 139 135 - 145 mmol/L   Potassium 3.7 3.5 - 5.1 mmol/L   Chloride 105 98 - 111 mmol/L   CO2 26 22 - 32 mmol/L   Glucose, Bld 119 (H) 70 - 99 mg/dL   BUN 16 8 - 23 mg/dL   Creatinine, Ser 0.860.92 0.61 - 1.24 mg/dL   Calcium 8.5 (L) 8.9 - 10.3 mg/dL   GFR calc  non Af Amer >60 >60 mL/min   GFR calc Af Amer >60 >60 mL/min   Anion gap 8 5 - 15    Comment: Performed at [facility], [address] [place], [place] [address]  CBC     Status: Abnormal   Collection Time: 08/27/18  4:32 AM  Result Value Ref Range   WBC 6.0 4.0 - 10.5 K/uL   RBC 4.68 4.22 - 5.81 MIL/uL   Hemoglobin 12.9 (L) 13.0 - 17.0 g/dL   HCT 39.3 39.0 - 52.0 %   MCV 84.0 80.0 - 100.0 fL   MCH 27.6 26.0 - 34.0 pg   MCHC 32.8 30.0 - 36.0 g/dL   RDW 13.9 11.5 - 15.5 %   Platelets 195 150 - 400 K/uL   nRBC 0.0 0.0 - 0.2 %    Comment: Performed at [facility], [address] [place], [place] [address]    Current Facility-Administered Medications  Medication Dose Route Frequency Provider Last Rate Last Dose  . 0.9 %  sodium chloride infusion  250 mL Intravenous PRN Demetrios Loll, MD   Stopped at 08/26/18 2341  . acetaminophen (TYLENOL) tablet 650 mg  650 mg Oral Q6H PRN Demetrios Loll, MD       Or  . acetaminophen (TYLENOL) suppository 650 mg  650 mg Rectal Q6H PRN Demetrios Loll, MD      . albuterol (PROVENTIL) (2.5 MG/3ML) 0.083% nebulizer solution 2.5 mg  2.5 mg Inhalation Q4H PRN Demetrios Loll, MD      . amLODipine (NORVASC) tablet 10 mg  10 mg Oral Daily Hinda Kehr, MD   10 mg at 08/27/18 0946  . aspirin chewable tablet 81 mg  81 mg Oral Daily Demetrios Loll, MD   81 mg at 08/27/18 0947  . atorvastatin (LIPITOR) tablet 10 mg  10 mg Oral Daily Hinda Kehr, MD   10 mg at 08/27/18 0947  . bisacodyl (DULCOLAX) EC tablet 5 mg  5 mg Oral Daily PRN Demetrios Loll, MD      . ceFEPIme (MAXIPIME) 2 g in sodium chloride 0.9 % 100 mL IVPB  2 g Intravenous Q12H Demetrios Loll, MD   Stopped at 08/27/18 1018  . enoxaparin (LOVENOX) injection 40 mg  40 mg Subcutaneous Q24H Demetrios Loll, MD   40 mg at 08/27/18 0946  . guaiFENesin (ROBITUSSIN) 100 MG/5ML solution 100 mg  5 mL Oral Q4H PRN Demetrios Loll, MD      . HYDROcodone-acetaminophen (NORCO/VICODIN) 5-325 MG per tablet 1-2 tablet  1-2 tablet Oral Q4H PRN Demetrios Loll, MD      . lamoTRIgine (LAMICTAL) tablet 150 mg  150 mg Oral BID Hinda Kehr, MD   150 mg at 08/27/18 0946  . memantine (NAMENDA) tablet 5 mg  5 mg Oral Daily Hinda Kehr, MD   5 mg at 08/27/18 7654  . multivitamin with minerals tablet 1 tablet  1 tablet Oral Daily Demetrios Loll, MD   1 tablet at 08/27/18 0947  . nicotine (NICODERM CQ - dosed in mg/24 hours) patch 14 mg  14 mg Transdermal Daily Demetrios Loll, MD   14 mg at 08/27/18 0946  . OLANZapine (ZYPREXA) tablet 20 mg  20 mg Oral QHS Hinda Kehr, MD   20 mg at 08/26/18 2217  . ondansetron (ZOFRAN) tablet 4 mg  4 mg Oral Q6H PRN Demetrios Loll, MD       Or  . ondansetron [facility]) injection 4 mg  4 mg Intravenous Q6H PRN Demetrios Loll,  MD      . senna-docusate (Senokot-S) tablet 1 tablet  1 tablet Oral QHS PRN Shaune Pollack, MD      .  sodium chloride flush (NS) 0.9 % injection 3 mL  3 mL Intravenous Q12H Shaune Pollack, MD   3 mL at 08/27/18 0941  . sodium chloride flush (NS) 0.9 % injection 3 mL  3 mL Intravenous PRN Shaune Pollack, MD      . vitamin B-12 (CYANOCOBALAMIN) tablet 1,000 mcg  1,000 mcg Oral Daily Loleta Rose, MD   1,000 mcg at 08/27/18 1610    Musculoskeletal: Strength & Muscle Tone: within normal limits Gait & Station: unsteady Patient leans: Backward  Psychiatric Specialty Exam: Physical Exam  Nursing note and vitals reviewed. Constitutional: He appears well-developed and well-nourished. No distress.  HENT:  Head: Normocephalic and atraumatic.  Eyes: EOM are normal.  Neck: Normal range of motion.  Cardiovascular: Normal rate and regular rhythm.  Respiratory: Effort normal. No respiratory distress.  Musculoskeletal: Normal range of motion.  Neurological: He is alert.  Psychiatric: His speech is normal and behavior is normal. Thought content normal. His affect is blunt. Cognition and memory are impaired.    Review of Systems  Unable to perform ROS: Psychiatric disorder  Constitutional: Negative.   Respiratory: Negative.   Cardiovascular: Negative.   Gastrointestinal: Negative.   Musculoskeletal: Positive for joint pain.  Neurological: Positive for weakness.  Psychiatric/Behavioral: Positive for memory loss. Negative for depression, hallucinations, substance abuse and suicidal ideas. The patient is not nervous/anxious and does not have insomnia.     Blood pressure 135/63, pulse 79, temperature 98.7 F (37.1 C), temperature source Oral, resp. rate 15, height  (1.93 m), weight 71.6 kg, SpO2 96 %.Body mass index is 19.21 kg/m.  General Appearance: Casual  Eye Contact:  None  Speech:  Clear and coherent  Volume:  Normal  Mood:  Euthymic  Affect:  Congruent  Thought Process:  Logical  Orientation:  Other:  Self  Thought Content: Logical  Suicidal Thoughts:  No  Homicidal Thoughts:  No   Memory:  Poor  Judgement:  Fair  Insight:  Lacking  Psychomotor Activity: Normal  Concentration:  Concentration: Good and Attention Span: Good  Recall:  Poor  Fund of Knowledge:  Fair  Language:  Fair  Akathisia:  NA  Handed:  Right  AIMS (if indicated):     Assets:  Communication Skills Desire for Improvement Housing Physical Health Social Support  ADL's:  Impaired  Cognition:  Impaired--moderate to severe  Sleep:   Adequate overnight    Treatment Plan Summary: Continue involuntary commitment Daily contact with patient to assess and evaluate symptoms and progress in treatment and Medication management  Dementia with behavioral disturbances: -Continued Namenda 5 mg daily -Seeking geriatric psych  Schizophrenia: -Continued Zyprexa 20 mg at bedtime -Continued Lamictal 150 mg BID  Disposition: Supportive therapy provided about ongoing stressors. Patient does meet criteria for geriatric psychiatric inpatient admission for stabilization and treatment.  Mariel Craft, MD 08/27/2018 7:00 PM

## 2018-08-27 NOTE — NC FL2 (Signed)
[place] LEVEL OF CARE SCREENING TOOL     IDENTIFICATION  Patient Name: Timothy [name] Birthdate: 1933-02-08 Sex: male Admission Date (Current Location): 08/21/2018  [facility] and [place] Number:  Engineering geologist and Address:         Provider Number: [id number]  Attending Physician Name and Address:  Demetrios Loll, MD  Relative Name and Phone Number:       Current Level of Care: Hospital Recommended Level of Care: Other (Comment)(Group Home) Prior Approval Number:    Date Approved/Denied:   PASRR Number:    Discharge Plan: Domiciliary (Rest home)    Current Diagnoses: Patient Active Problem List   Diagnosis Date Noted  . Pneumonia 08/26/2018  . Pressure ulcer 08/30/2015  . Atrial fibrillation and flutter ([place]) 08/29/2015  . Cerebral vascular disease 06/09/2015  . B12 deficiency 06/09/2015  . Hypokalemia 06/09/2015  . Undifferentiated schizophrenia ([place])   . Seizures ([place]) 11/28/2014  . TIA (transient ischemic attack) 11/28/2014  . Schizophrenia ([place]) 11/28/2014  . Hypertension 11/28/2014  . Dyslipidemia 11/28/2014  . Dementia with behavioral disturbance ([place]) 11/28/2014    Orientation RESPIRATION BLADDER Height & Weight     Self  Normal Continent Weight: 157 lb 13.6 oz (71.6 kg) Height:  6\' 4"  (193 cm)  BEHAVIORAL SYMPTOMS/MOOD NEUROLOGICAL BOWEL NUTRITION STATUS      Continent Diet(Diet: DYS 2)  AMBULATORY STATUS COMMUNICATION OF NEEDS Skin   Limited Assist Verbally Normal                       Personal Care Assistance Level of Assistance  Bathing, Feeding, Dressing Bathing Assistance: Limited assistance Feeding assistance: Independent Dressing Assistance: Limited assistance     Functional Limitations Info  Sight, Hearing, Speech Sight Info: Adequate Hearing Info: Adequate Speech Info: Adequate    SPECIAL CARE FACTORS FREQUENCY                       Contractures      Additional Factors Info  Code Status,  Allergies Code Status Info: Full Code. Allergies Info: No Known Allergies           Current Medications (08/27/2018):  This is the current hospital active medication list Current Facility-Administered Medications  Medication Dose Route Frequency Provider Last Rate Last Dose  . 0.9 %  sodium chloride infusion  250 mL Intravenous PRN Demetrios Loll, MD   Stopped at 08/26/18 2341  . acetaminophen (TYLENOL) tablet 650 mg  650 mg Oral Q6H PRN Demetrios Loll, MD       Or  . acetaminophen (TYLENOL) suppository 650 mg  650 mg Rectal Q6H PRN Demetrios Loll, MD      . albuterol (PROVENTIL) (2.5 MG/3ML) 0.083% nebulizer solution 2.5 mg  2.5 mg Inhalation Q4H PRN Demetrios Loll, MD      . amLODipine (NORVASC) tablet 10 mg  10 mg Oral Daily Hinda Kehr, MD   10 mg at 08/27/18 0946  . aspirin chewable tablet 81 mg  81 mg Oral Daily Demetrios Loll, MD   81 mg at 08/27/18 0947  . atorvastatin (LIPITOR) tablet 10 mg  10 mg Oral Daily Hinda Kehr, MD   10 mg at 08/27/18 0947  . bisacodyl (DULCOLAX) EC tablet 5 mg  5 mg Oral Daily PRN Demetrios Loll, MD      . ceFEPIme (MAXIPIME) 2 g in sodium chloride 0.9 % 100 mL IVPB  2 g Intravenous Q12H Demetrios Loll, MD 200 mL/hr  at 08/27/18 0946 2 g at 08/27/18 0946  . enoxaparin (LOVENOX) injection 40 mg  40 mg Subcutaneous Q24H Shaune Pollackhen, Qing, MD   40 mg at 08/27/18 0946  . guaiFENesin (ROBITUSSIN) 100 MG/5ML solution 100 mg  5 mL Oral Q4H PRN Shaune Pollackhen, Qing, MD      . HYDROcodone-acetaminophen (NORCO/VICODIN) 5-325 MG per tablet 1-2 tablet  1-2 tablet Oral Q4H PRN Shaune Pollackhen, Qing, MD      . lamoTRIgine (LAMICTAL) tablet 150 mg  150 mg Oral BID Loleta RoseForbach, Cory, MD   150 mg at 08/27/18 0946  . memantine (NAMENDA) tablet 5 mg  5 mg Oral Daily Loleta RoseForbach, Cory, MD   5 mg at 08/27/18 [id number]  . multivitamin with minerals tablet 1 tablet  1 tablet Oral Daily Shaune Pollackhen, Qing, MD   1 tablet at 08/27/18 0947  . nicotine (NICODERM CQ - dosed in mg/24 hours) patch 14 mg  14 mg Transdermal Daily Shaune Pollackhen, Qing, MD   14 mg at  08/27/18 0946  . OLANZapine (ZYPREXA) tablet 20 mg  20 mg Oral QHS Loleta RoseForbach, Cory, MD   20 mg at 08/26/18 2217  . ondansetron (ZOFRAN) tablet 4 mg  4 mg Oral Q6H PRN Shaune Pollackhen, Qing, MD       Or  . ondansetron [facility](ZOFRAN) injection 4 mg  4 mg Intravenous Q6H PRN Shaune Pollackhen, Qing, MD      . senna-docusate (Senokot-S) tablet 1 tablet  1 tablet Oral QHS PRN Shaune Pollackhen, Qing, MD      . sodium chloride flush (NS) 0.9 % injection 3 mL  3 mL Intravenous Q12H Shaune Pollackhen, Qing, MD   3 mL at 08/27/18 0941  . sodium chloride flush (NS) 0.9 % injection 3 mL  3 mL Intravenous PRN Shaune Pollackhen, Qing, MD      . vitamin B-12 (CYANOCOBALAMIN) tablet 1,000 mcg  1,000 mcg Oral Daily Loleta RoseForbach, Cory, MD   1,000 mcg at 08/27/18 [id number]     Discharge Medications: Please see discharge summary for a list of discharge medications.  Relevant Imaging Results:  Relevant Lab Results:   Additional Information SSN: [id number]  Eithel Ryall, Darleen CrockerBailey M, LCSW

## 2018-08-27 NOTE — TOC Initial Note (Addendum)
Transition of Care [facility]) - Initial/Assessment Note    Patient Details  Name: Timothy [name] MRN: [id number] Date of Birth: 1932-03-15  [facility] CM/SW Contact:    Mckenze Slone, Lenice Llamas Phone Number: [phone]  08/27/2018, 10:15 AM  Clinical Narrative:  Patient was admitted from the ED and TTS was working on geri-psych placement. Per Kerry Dory with TTS patient did have a bed at [facility] inpatient geri-psych unit before he was admitted to [facility]. CSW contacted [facility] geri-psych unit [phone] and spoke to admissions RN Caryl Pina. Per Caryl Pina patient has been accepted to [facility] however they do not have a bed available at this time because they have no discharges. CSW will follow up with [facility] about a bed. Per chart patient is from a group home and has a legal guardian [place]. CSW attempted to call Tedra Coupe however she did not answer and a voicemail was left.          Patient's guardian Tedra Coupe called [place] back and reported that patient is a resident at [facility]. Per Tedra Coupe patient has been a resident for 6 years at the group home and the group home owner is Ainsley Spinner [phone]. Group home phone # [phone]. Per Erlene Senters will accept patient back once patient is stabilized at a geri-psych hospital.            Expected Discharge Plan: Psychiatric Hospital Barriers to Discharge: Continued Medical Work up, Psych Bed not available   Patient Goals and CMS Choice        Expected Discharge Plan and Services Expected Discharge Plan: [facility] In-house Referral: Clinical Social Work Discharge Planning Services: CM Consult   Living arrangements for the past 2 months: Group Home                                      Prior Living Arrangements/Services Living arrangements for the past 2 months: Group Home   Patient language and need for interpreter reviewed:: No        Need for Family Participation in Patient Care: Yes  (Comment) Care giver support system in place?: Yes (comment) Current home services: Other (comment)(Group home residents) Criminal Activity/Legal Involvement Pertinent to Current Situation/Hospitalization: No - Comment as needed  Activities of Daily Living Home Assistive Devices/Equipment: Other (Comment), None ADL Screening (condition at time of admission) Patient's cognitive ability adequate to safely complete daily activities?: No Is the patient deaf or have difficulty hearing?: No Does the patient have difficulty seeing, even when wearing glasses/contacts?: No Does the patient have difficulty concentrating, remembering, or making decisions?: Yes Patient able to express need for assistance with ADLs?: Yes Does the patient have difficulty dressing or bathing?: No Independently performs ADLs?: Yes (appropriate for developmental age) Does the patient have difficulty walking or climbing stairs?: No Weakness of Legs: None Weakness of Arms/Hands: None  Permission Sought/Granted                  Emotional Assessment Appearance:: Appears stated age Attitude/Demeanor/Rapport: Other (comment)(Not alert and oriented)   Orientation: : Oriented to Self, Fluctuating Orientation (Suspected and/or reported Sundowners) Alcohol / Substance Use: Not Applicable Psych Involvement: Yes (comment)(Inpatient psych is the recommendation.)  Admission diagnosis:  Aggressive behavior [R46.89] Screening for cardiovascular, respiratory, and genitourinary diseases [Z13.6, Z13.89, Z13.83] Acute cystitis without hematuria [N30.00] Altered mental status, unspecified altered mental status type [R41.82] Pneumonia of right lower  lobe due to infectious organism [facility](HCC) [J18.1] Patient Active Problem List   Diagnosis Date Noted  . Pneumonia 08/26/2018  . Pressure ulcer 08/30/2015  . Atrial fibrillation and flutter ([facility]) 08/29/2015  . Cerebral vascular disease 06/09/2015  . B12 deficiency 06/09/2015  .  Hypokalemia 06/09/2015  . 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 Seizures ([facility]) 11/28/2014  . TIA (transient ischemic attack) 11/28/2014  . Schizophrenia ([facility]) 11/28/2014  . Hypertension 11/28/2014  . Dyslipidemia 11/28/2014  . Dementia with behavioral disturbance ([facility]) 11/28/2014   PCP:  Patient, No Pcp Per Pharmacy:   [facility] - [place], [place] - [address] [place] [address] Phone: [phone] Fax: [phone]  [facility] [place] - [address] [place] [address] Phone: [phone] Fax: [phone]     Social Determinants of Health (SDOH) Interventions    Readmission Risk Interventions No flowsheet data found.

## 2018-08-27 NOTE — Progress Notes (Signed)
[employer] - [place] [facility]   PATIENT NAME: Timothy [name]    MR#:  [id number]  DATE OF BIRTH:  January 02, 1933  SUBJECTIVE:  CHIEF COMPLAINT:   Chief Complaint  Patient presents with  . Altered Mental Status   The patient has no complaints except cough with sputum. REVIEW OF SYSTEMS:  Review of Systems  Constitutional: Negative for chills, fever and malaise/fatigue.  HENT: Negative for sore throat.   Eyes: Negative for blurred vision and double vision.  Respiratory: Positive for cough, sputum production and shortness of breath. Negative for hemoptysis, wheezing and stridor.   Cardiovascular: Negative for chest pain, palpitations, orthopnea and leg swelling.  Gastrointestinal: Negative for abdominal pain, blood in stool, diarrhea, melena, nausea and vomiting.  Genitourinary: Negative for dysuria, flank pain and hematuria.  Musculoskeletal: Negative for back pain and joint pain.  Skin: Negative for rash.  Neurological: Negative for dizziness, sensory change, focal weakness, seizures, loss of consciousness, weakness and headaches.  Endo/Heme/Allergies: Negative for polydipsia.  Psychiatric/Behavioral: Negative for depression. The patient is not nervous/anxious.     DRUG ALLERGIES:  No Known Allergies VITALS:  Blood pressure 132/67, pulse 88, temperature 99.6 F (37.6 C), temperature source Oral, resp. rate 18, height 6\' 4"  (1.93 m), weight 71.6 kg, SpO2 96 %. PHYSICAL EXAMINATION:  Physical Exam Constitutional:      General: He is not in acute distress. HENT:     Head: Normocephalic.     Mouth/Throat:     Mouth: Mucous membranes are moist.  Eyes:     General: No scleral icterus.    Conjunctiva/sclera: Conjunctivae normal.     Pupils: Pupils are equal, round, and reactive to light.  Neck:     Musculoskeletal: Normal range of motion and neck supple.     Vascular: No JVD.     Trachea: No tracheal deviation.  Cardiovascular:     Rate and Rhythm:  Normal rate and regular rhythm.     Heart sounds: Normal heart sounds. No murmur. No gallop.   Pulmonary:     Effort: Pulmonary effort is normal. No respiratory distress.     Breath sounds: Normal breath sounds. No wheezing or rales.  Abdominal:     General: Bowel sounds are normal. There is no distension.     Palpations: Abdomen is soft.     Tenderness: There is no abdominal tenderness. There is no rebound.  Musculoskeletal: Normal range of motion.        General: No tenderness.     Right lower leg: No edema.     Left lower leg: No edema.  Skin:    Findings: No erythema or rash.  Neurological:     General: No focal deficit present.     Mental Status: He is alert and oriented to person, place, and time.     Cranial Nerves: No cranial nerve deficit.    LABORATORY PANEL:  Male CBC Recent Labs  Lab 08/27/18 0432  WBC 6.0  HGB 12.9*  HCT 39.3  PLT 195   ------------------------------------------------------------------------------------------------------------------ Chemistries  Recent Labs  Lab 08/26/18 0711 08/27/18 0432  NA 138 139  K 4.3 3.7  CL 100 105  CO2 27 26  GLUCOSE 106* 119*  BUN 19 16  CREATININE 0.92 0.92  CALCIUM 9.2 8.5*  AST 39  --   ALT 21  --   ALKPHOS 50  --   BILITOT 1.4*  --    RADIOLOGY:  No results found. ASSESSMENT AND PLAN:  Pneumonia, possible aspiration pneumonia. He was treated with Zithromax and Rocephin in the ED, changed to cefepime and vancomycin IV.  Vancomycin was discontinued due to negative MRSA screen.   Robitussin as needed.  Albuterol as needed.  Hypertension.  Continue home hypertension medication. History of TIA.  Continue Lipitor, and aspirin. History of seizure disorder.  Continue home medication. Schizophrenia.  Continue home medication per psych physician. Tobacco abuse.  Smoking cessation was counseled for 3 to 4 minutes.  Nicotine patch.  All the records are reviewed and case discussed with Care  Management/Social Worker. Management plans discussed with the patient, family and they are in agreement.  CODE STATUS: Full Code  TOTAL TIME TAKING CARE OF THIS PATIENT: 26 minutes.   More than 50% of the time was spent in counseling/coordination of care: YES  POSSIBLE D/C IN 1-2 DAYS, DEPENDING ON CLINICAL CONDITION.   Demetrios Loll M.D on 08/27/2018 at 12:19 PM  Between 7am to 6pm - Pager - [phone]  After 6pm go to [email or web address] - [facility]

## 2018-08-28 ENCOUNTER — Inpatient Hospital Stay: Payer: Medicare Other

## 2018-08-28 LAB — HIV ANTIBODY (ROUTINE TESTING W REFLEX): HIV Screen 4th Generation wRfx: NONREACTIVE

## 2018-08-28 MED ORDER — LEVOFLOXACIN 750 MG PO TABS
750.0000 mg | ORAL_TABLET | Freq: Every day | ORAL | Status: DC
  Administered 2018-08-28 – 2018-09-01 (×5): 750 mg via ORAL
  Filled 2018-08-28 (×5): qty 1
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
## 2018-08-28 NOTE — TOC Progression Note (Signed)
Transition of Care [facility]) - Progression Note    Patient Details  Name: Timothy [name] MRN: [id number] Date of Birth: 05-27-1932  Transition of Care [facility]) CM/SW [place], [place] Phone Number: 08/28/2018, 8:58 AM  Clinical Narrative:  CSW contacted Clarisa Fling [phone]. CSW spoke with Juliann Pulse and inquired about a bed for patient. Juliann Pulse states that they do not have any beds today and she is unsure when a bed will be available. CSW will continue to follow for discharge planning.      Expected Discharge Plan: Psychiatric Hospital Barriers to Discharge: Continued Medical Work up, Psych Bed not available  Expected Discharge Plan and Services Expected Discharge Plan: Psychiatric Hospital In-house Referral: Clinical Social Work Discharge Planning Services: CM Consult   Living arrangements for the past 2 months: Group Home                                       Social Determinants of Health (SDOH) Interventions    Readmission Risk Interventions No flowsheet data found.

## 2018-08-28 NOTE — Progress Notes (Signed)
[facility] [place] [facility]   PATIENT NAME: Timothy [name]    MR#:  [id number]  DATE OF BIRTH:  08-Jun-1932  SUBJECTIVE:  CHIEF COMPLAINT:   Chief Complaint  Patient presents with  . Altered Mental Status   The patient has no complaints except cough with sputum. REVIEW OF SYSTEMS:  Review of Systems  Constitutional: Negative for chills, fever and malaise/fatigue.  HENT: Negative for sore throat.   Eyes: Negative for blurred vision and double vision.  Respiratory: Negative for cough, hemoptysis, sputum production, shortness of breath, wheezing and stridor.   Cardiovascular: Negative for chest pain, palpitations, orthopnea and leg swelling.  Gastrointestinal: Negative for abdominal pain, blood in stool, diarrhea, melena, nausea and vomiting.  Genitourinary: Negative for dysuria, flank pain and hematuria.  Musculoskeletal: Negative for back pain and joint pain.  Skin: Negative for rash.  Neurological: Negative for dizziness, sensory change, focal weakness, seizures, loss of consciousness, weakness and headaches.  Endo/Heme/Allergies: Negative for polydipsia.  Psychiatric/Behavioral: Negative for depression. The patient is not nervous/anxious.     DRUG ALLERGIES:  No Known Allergies VITALS:  Blood pressure 138/62, pulse 78, temperature 98.6 F (37 C), temperature source Oral, resp. rate 16, height 6\' 4"  (1.93 m), weight 71.6 kg, SpO2 97 %. PHYSICAL EXAMINATION:  Physical Exam Constitutional:      General: He is not in acute distress. HENT:     Head: Normocephalic.     Mouth/Throat:     Mouth: Mucous membranes are moist.  Eyes:     General: No scleral icterus.    Conjunctiva/sclera: Conjunctivae normal.     Pupils: Pupils are equal, round, and reactive to light.  Neck:     Musculoskeletal: Normal range of motion and neck supple.     Vascular: No JVD.     Trachea: No tracheal deviation.  Cardiovascular:     Rate and Rhythm: Normal rate and  regular rhythm.     Heart sounds: Normal heart sounds. No murmur. No gallop.   Pulmonary:     Effort: Pulmonary effort is normal. No respiratory distress.     Breath sounds: Normal breath sounds. No wheezing or rales.  Abdominal:     General: Bowel sounds are normal. There is no distension.     Palpations: Abdomen is soft.     Tenderness: There is no abdominal tenderness. There is no rebound.  Musculoskeletal: Normal range of motion.        General: No tenderness.     Right lower leg: No edema.     Left lower leg: No edema.  Skin:    Findings: No erythema or rash.  Neurological:     General: No focal deficit present.     Mental Status: He is alert and oriented to person, place, and time.     Cranial Nerves: No cranial nerve deficit.    LABORATORY PANEL:  Male CBC Recent Labs  Lab 08/27/18 0432  WBC 6.0  HGB 12.9*  HCT 39.3  PLT 195   ------------------------------------------------------------------------------------------------------------------ Chemistries  Recent Labs  Lab 08/26/18 0711 08/27/18 0432  NA 138 139  K 4.3 3.7  CL 100 105  CO2 27 26  GLUCOSE 106* 119*  BUN 19 16  CREATININE 0.92 0.92  CALCIUM 9.2 8.5*  AST 39  --   ALT 21  --   ALKPHOS 50  --   BILITOT 1.4*  --    RADIOLOGY:  No results found. ASSESSMENT AND PLAN:   Pneumonia, possible  aspiration pneumonia. He was treated with Zithromax and Rocephin in the ED, changed to cefepime and vancomycin IV.  Vancomycin was discontinued due to negative MRSA screen.  Change to Levaquin p.o. Robitussin as needed.  Albuterol as needed.  Hypertension.  Continue home hypertension medication. History of TIA.  Continue Lipitor, and aspirin. History of seizure disorder.  Continue home medication. Schizophrenia.  Continue home medication per psych physician. Tobacco abuse.  Smoking cessation was counseled for 3 to 4 minutes.  Nicotine patch. Waiting for geriatric psych facility placement. All the records  are reviewed and case discussed with Care Management/Social Worker. Management plans discussed with the patient, family and they are in agreement.  CODE STATUS: Full Code  TOTAL TIME TAKING CARE OF THIS PATIENT: 26 minutes.   More than 50% of the time was spent in counseling/coordination of care: YES  POSSIBLE D/C IN 1-2 DAYS, DEPENDING ON CLINICAL CONDITION.   Demetrios Loll M.D on 08/28/2018 at 11:10 AM  Between 7am to 6pm - Pager - [phone]  After 6pm go to [email or web address] - [facility]

## 2018-08-29 NOTE — TOC Progression Note (Signed)
Transition of Care [facility]) - Progression Note    Patient Details  Name: Timothy [name] MRN: [id number] Date of Birth: 07-Apr-1932  Transition of Care [facility]) CM/SW Contact  Shelbie Hutching, RN Phone Number: 08/29/2018, 1:40 PM  Clinical Narrative:    RNCM followed up with [facility] and spoke with Caryl Pina.  Patient remains on their waiting list.  The facility has no discharges scheduled for this week.  Facility has RNCM contact number to call if anything does become available sooner than expected.    Expected Discharge Plan: Psychiatric Hospital Barriers to Discharge: Continued Medical Work up, Psych Bed not available  Expected Discharge Plan and Services Expected Discharge Plan: Psychiatric Hospital In-house Referral: Clinical Social Work Discharge Planning Services: CM Consult   Living arrangements for the past 2 months: Group Home                                       Social Determinants of Health (SDOH) Interventions    Readmission Risk Interventions No flowsheet data found.

## 2018-08-29 NOTE — Care Management Important Message (Signed)
Important Message  Patient Details  Name: Timothy [name] MRN: [id number] Date of Birth: 13-Oct-1932   Medicare Important Message Given:  Yes    Juliann Pulse A Kalev Temme 08/29/2018, 10:36 AM
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
## 2018-08-29 NOTE — Progress Notes (Signed)
[facility] - [place] [facility]   PATIENT NAME: Timothy [name]    MR#:  [id number]  DATE OF BIRTH:  [birth year]  SUBJECTIVE:  CHIEF COMPLAINT:   Chief Complaint  Patient presents with  . Altered Mental Status   Laying in bed and taking pills with nectar thickened liquids.  He has no concerns other than cough. Afebrile.  REVIEW OF SYSTEMS:  Review of Systems  Constitutional: Negative for chills, fever and malaise/fatigue.  HENT: Negative for sore throat.   Eyes: Negative for blurred vision and double vision.  Respiratory: Negative for cough, hemoptysis, sputum production, shortness of breath, wheezing and stridor.   Cardiovascular: Negative for chest pain, palpitations, orthopnea and leg swelling.  Gastrointestinal: Negative for abdominal pain, blood in stool, diarrhea, melena, nausea and vomiting.  Genitourinary: Negative for dysuria, flank pain and hematuria.  Musculoskeletal: Negative for back pain and joint pain.  Skin: Negative for rash.  Neurological: Negative for dizziness, sensory change, focal weakness, seizures, loss of consciousness, weakness and headaches.  Endo/Heme/Allergies: Negative for polydipsia.  Psychiatric/Behavioral: Negative for depression. The patient is not nervous/anxious.    DRUG ALLERGIES:  No Known Allergies VITALS:  Blood pressure 130/64, pulse 80, temperature 98.3 F (36.8 C), temperature source Oral, resp. rate 18, height 6\' 4"  (1.93 m), weight 71.6 kg, SpO2 95 %. PHYSICAL EXAMINATION:  Physical Exam Constitutional:      General: He is not in acute distress. HENT:     Head: Normocephalic.     Mouth/Throat:     Mouth: Mucous membranes are moist.  Eyes:     General: No scleral icterus.    Conjunctiva/sclera: Conjunctivae normal.     Pupils: Pupils are equal, round, and reactive to light.  Neck:     Musculoskeletal: Normal range of motion and neck supple.     Vascular: No JVD.     Trachea: No tracheal deviation.   Cardiovascular:     Rate and Rhythm: Normal rate and regular rhythm.     Heart sounds: Normal heart sounds. No murmur. No gallop.   Pulmonary:     Effort: Pulmonary effort is normal. No respiratory distress.     Breath sounds: Normal breath sounds. No wheezing or rales.  Abdominal:     General: Bowel sounds are normal. There is no distension.     Palpations: Abdomen is soft.     Tenderness: There is no abdominal tenderness. There is no rebound.  Musculoskeletal: Normal range of motion.        General: No tenderness.     Right lower leg: No edema.     Left lower leg: No edema.  Skin:    Findings: No erythema or rash.  Neurological:     General: No focal deficit present.     Mental Status: He is alert.     Cranial Nerves: No cranial nerve deficit.     Comments: Alert and awake    LABORATORY PANEL:  Male CBC Recent Labs  Lab 08/27/18 0432  WBC 6.0  HGB 12.9*  HCT 39.3  PLT 195   ------------------------------------------------------------------------------------------------------------------ Chemistries  Recent Labs  Lab 08/26/18 0711 08/27/18 0432  NA 138 139  K 4.3 3.7  CL 100 105  CO2 27 26  GLUCOSE 106* 119*  BUN 19 16  CREATININE 0.92 0.92  CALCIUM 9.2 8.5*  AST 39  --   ALT 21  --   ALKPHOS 50  --   BILITOT 1.4*  --    RADIOLOGY:  No results found. ASSESSMENT AND PLAN:   * Pneumonia, possible aspiration pneumonia. He was treated with Zithromax and Rocephin in the ED, changed to cefepime and vancomycin IV.  Vancomycin was discontinued due to negative MRSA screen.  Changed to Levaquin p.o. Robitussin as needed.  Albuterol as needed. Since patient is improving with Levaquin I will treat for 5 days total.  Hypertension.  Continue home hypertension medication.  History of TIA.  Continue Lipitor, and aspirin.  History of seizure disorder.  Continue home medication.  Schizophrenia.  Continue home medication per psych physician.  Tobacco abuse.   counseled to quit on admission  All the records are reviewed and case discussed with Care Management/Social Worker. Management plans discussed with the patient, family and they are in agreement.  CODE STATUS: Full Code  TOTAL TIME TAKING CARE OF THIS PATIENT: 25 minutes.   POSSIBLE D/C IN 1-2 DAYS, DEPENDING ON CLINICAL CONDITION.  Neita Carp M.D on 08/29/2018 at 11:22 AM  Between 7am to 6pm - Pager - [phone]  After 6pm go to [email or web address] - [employer] [facility]

## 2018-08-29 NOTE — Discharge Instructions (Signed)
SLP Diet Recommendations Dysphagia 2 (Fine chop) solids;Nectar thick liquid  Liquid Administration via Cup;Straw  Medication Administration Crushed with puree  Compensations Minimize environmental distractions;Slow rate;Small sips/bites;Lingual sweep for clearance of pocketing;Multiple dry swallows after each bite/sip;Follow solids with liquid  Postural Changes Remain semi-upright after after feeds/meals (Comment);Seated upright at 90 degrees

## 2018-08-30 ENCOUNTER — Inpatient Hospital Stay: Payer: Medicare Other

## 2018-08-30 NOTE — Consult Note (Signed)
[place] Psychiatry Consult   Reason for Consult: Altered mental status Referring Physician: Dr. Darvin Neighbours Patient Identification: Timothy [name] MRN:  [id number] Principal Diagnosis: Schizophrenia Diagnosis: Schizophrenia  Patient is seen, chart is reviewed, case reviewed with social work regarding discharge planning.  Patient has had no behavioral concerns while admitted to the hospital.  Involuntary commitment can be rescinded as patient back at baseline mental status. Total Time spent with patient: 45 minutes    Per initial psychiatric intake: Subjective: Patient does not say much but No during his bath.  Eating when fed, minimal movement in bed.  History of catatonia, confounded with dementia and living in a skilled nursing facility for a few years. Timothy [name] is a 85 y.o. male patient presented to [facility] ED via [facility]. Per EMS discussion with triage nurse the patient lives in a group home and has a history of PTSD and at times tends to experience flashbacks that usually precipitate into these events.  This provider try reaching out to the patient guardian Ms. Timothy [name] [phone]; [phone] and was unable to make contact with her  The patient was seen face-to-face by this provider; chart reviewed and consulted with Dr. Jacqualine Code on 08/21/2018 due to the care of the patient. It was discussed with the provider that the patient does meet criteria to be admitted to the a geriatric psychiatric inpatient unit for treatment and stabilization. On evaluation the patient is alert to self only, uncooperative, and mood-congruent with affect.  The patient is exhibiting internal stimuli and presenting with some hallucination and delusional thinking. This provider is unable to assess if the patient is experiencing visual hallucinations, suicidal, homicidal, or self-harm ideations. The patient is presenting with  psychotic behaviors. During an encounter with the patient, he was not able to answer  questions appropriately. Collateral information was not obtained from This provider try reaching out to the patient guardian Ms. Timothy [name] [phone]; [phone] and was unable to make contact with her Plan: The patient is a safety risk to self and others due to his aggression and does  require psychiatric inpatient admission for stabilization and treatment.  HPI:  Per Dr. Jacqualine Code; Timothy [name] is a 85 y.o. male history of schizophrenia, seizures, TIAs, A. fib, stroke, and behavioral disturbance EMS reports the patient was at his group home, he became agitated had "tore up" his room and was acting very agitated and aggressive prompting police and EMS call.  Group home reported concerned that this was due to his mental illness and flashbacks from [place] with previous history of similar in the past.  Patient had experienced cognitive decline in the ED despite being treated for his UTI.  Then, pneumonia was identified and transferred to the medical floor.    Past Psychiatric History: Schizophrenia ([place])  On evaluation today, patient is in bed.  Sitter is at bedside, and reports that patient has been pleasant and cooperative.  He has had no behavioral issues since being on the medical floor.  Patient smiles and states, "I feel great." He denies SI, HI, AVH.  Patient is aware of self and surroundings.  He is oriented to month and year, and states date with prompts.  Patient appears to be at his baseline mental status. Physical therapy has evaluated patient and recommends skilled nursing facility for strengthening prior to discharge back to his group home. Patient denies dysarthria or jaw stiffness.  No tardive dyskinesia noted.  Patient moves all extremities and does not report any stiffness.  No cogwheel rigidity  noted on exam.  Risk to Self:  No Risk to Others:  No Prior Inpatient Therapy:  Yes Prior Outpatient Therapy:  Yes  Past Medical History:  Past Medical History:  Diagnosis Date  .  Hypertension   . Schizophrenia (HCC)   . Seizures (HCC)   . TIA (transient ischemic attack)    History reviewed. No pertinent surgical history. Family History: History reviewed. No pertinent family history. Family Psychiatric  History: None indicated  Social History:  Social History   Substance and Sexual Activity  Alcohol Use No  . Alcohol/week: 0.0 standard drinks     Social History   Substance and Sexual Activity  Drug Use No    Social History   Socioeconomic History  . Marital status: Widowed    Spouse name: Not on file  . Number of children: Not on file  . Years of education: Not on file  . Highest education level: Not on file  Occupational History  . Not on file  Social Needs  . Financial resource strain: Not on file  . Food insecurity    Worry: Not on file    Inability: Not on file  . Transportation needs    Medical: Not on file    Non-medical: Not on file  Tobacco Use  . Smoking status: Current Every Day Smoker    Packs/day: 0.25    Years: 25.00    Pack years: 6.25    Types: Cigarettes  . Smokeless tobacco: Never Used  Substance and Sexual Activity  . Alcohol use: No    Alcohol/week: 0.0 standard drinks  . Drug use: No  . Sexual activity: Not Currently  Lifestyle  . Physical activity    Days per week: Not on file    Minutes per session: Not on file  . Stress: Not on file  Relationships  . Social Musicianconnections    Talks on phone: Not on file    Gets together: Not on file    Attends religious service: Not on file    Active member of club or organization: Not on file    Attends meetings of clubs or organizations: Not on file    Relationship status: Not on file  Other Topics Concern  . Not on file  Social History Narrative  . Not on file   Additional Social History: Patient lives in group home    Allergies:  No Known Allergies  Labs:  No results found for this or any previous visit (from the past 48 hour(s)).  Current Facility-Administered  Medications  Medication Dose Route Frequency Provider Last Rate Last Dose  . 0.9 %  sodium chloride infusion  250 mL Intravenous PRN Shaune Pollackhen, Qing, MD   Stopped at 08/27/18 2318  . acetaminophen (TYLENOL) tablet 650 mg  650 mg Oral Q6H PRN Shaune Pollackhen, Qing, MD   650 mg at 08/30/18 [id number]   Or  . acetaminophen (TYLENOL) suppository 650 mg  650 mg Rectal Q6H PRN Shaune Pollackhen, Qing, MD      . albuterol (PROVENTIL) (2.5 MG/3ML) 0.083% nebulizer solution 2.5 mg  2.5 mg Inhalation Q4H PRN Shaune Pollackhen, Qing, MD      . amLODipine (NORVASC) tablet 10 mg  10 mg Oral Daily Loleta RoseForbach, Cory, MD   10 mg at 08/30/18 0942  . aspirin chewable tablet 81 mg  81 mg Oral Daily Shaune Pollackhen, Qing, MD   81 mg at 08/30/18 [id number]  . atorvastatin (LIPITOR) tablet 10 mg  10 mg Oral Daily Loleta RoseForbach, Cory, MD   10  mg at 08/30/18 0942  . bisacodyl (DULCOLAX) EC tablet 5 mg  5 mg Oral Daily PRN Shaune Pollackhen, Qing, MD      . enoxaparin (LOVENOX) injection 40 mg  40 mg Subcutaneous Q24H Shaune Pollackhen, Qing, MD   40 mg at 08/30/18 0942  . guaiFENesin (ROBITUSSIN) 100 MG/5ML solution 100 mg  5 mL Oral Q4H PRN Shaune Pollackhen, Qing, MD      . HYDROcodone-acetaminophen (NORCO/VICODIN) 5-325 MG per tablet 1-2 tablet  1-2 tablet Oral Q4H PRN Shaune Pollackhen, Qing, MD   1 tablet at 08/27/18 2329  . lamoTRIgine (LAMICTAL) tablet 150 mg  150 mg Oral BID Loleta RoseForbach, Cory, MD   150 mg at 08/30/18 0942  . levofloxacin (LEVAQUIN) tablet 750 mg  750 mg Oral Daily Shaune Pollackhen, Qing, MD   750 mg at 08/30/18 0942  . memantine (NAMENDA) tablet 5 mg  5 mg Oral Daily Loleta RoseForbach, Cory, MD   5 mg at 08/30/18 0942  . multivitamin with minerals tablet 1 tablet  1 tablet Oral Daily Shaune Pollackhen, Qing, MD   1 tablet at 08/30/18 [phone]  . nicotine (NICODERM CQ - dosed in mg/24 hours) patch 14 mg  14 mg Transdermal Daily Shaune Pollackhen, Qing, MD   14 mg at 08/30/18 0943  . OLANZapine (ZYPREXA) tablet 20 mg  20 mg Oral QHS Loleta RoseForbach, Cory, MD   20 mg at 08/29/18 2300  . ondansetron (ZOFRAN) tablet 4 mg  4 mg Oral Q6H PRN Shaune Pollackhen, Qing, MD       Or  . ondansetron  [facility](ZOFRAN) injection 4 mg  4 mg Intravenous Q6H PRN Shaune Pollackhen, Qing, MD      . senna-docusate (Senokot-S) tablet 1 tablet  1 tablet Oral QHS PRN Shaune Pollackhen, Qing, MD      . sodium chloride flush (NS) 0.9 % injection 3 mL  3 mL Intravenous Q12H Shaune Pollackhen, Qing, MD   3 mL at 08/30/18 1212  . sodium chloride flush (NS) 0.9 % injection 3 mL  3 mL Intravenous PRN Shaune Pollackhen, Qing, MD      . vitamin B-12 (CYANOCOBALAMIN) tablet 1,000 mcg  1,000 mcg Oral Daily Loleta RoseForbach, Cory, MD   1,000 mcg at 08/30/18 [id number]    Musculoskeletal: Strength & Muscle Tone: within normal limits Gait & Station: unsteady and weak Patient leans: Backward  Psychiatric Specialty Exam: Physical Exam  Nursing note and vitals reviewed. Constitutional: He appears well-developed and well-nourished. No distress.  HENT:  Head: Normocephalic and atraumatic.  Eyes: EOM are normal.  Neck: Normal range of motion.  Cardiovascular: Normal rate and regular rhythm.  Respiratory: Effort normal. No respiratory distress.  Musculoskeletal: Normal range of motion.  Neurological: He is alert.  Psychiatric: He has a normal mood and affect. His speech is normal and behavior is normal. Thought content normal. Cognition and memory are impaired.    Review of Systems  Unable to perform ROS: Psychiatric disorder  Constitutional: Negative.   Respiratory: Negative.   Cardiovascular: Negative.   Gastrointestinal: Negative.   Musculoskeletal: Positive for joint pain.  Neurological: Positive for weakness.  Psychiatric/Behavioral: Positive for memory loss. Negative for depression, hallucinations, substance abuse and suicidal ideas. The patient is not nervous/anxious and does not have insomnia.     Blood pressure (!) 154/73, pulse 85, temperature 98.9 F (37.2 C), temperature source Oral, resp. rate 20, height 6\' 4"  (1.93 m), weight 71.6 kg, SpO2 95 %.Body mass index is 19.21 kg/m.  General Appearance: Casual  Eye Contact:  Fair  Speech:  Clear and coherent  Volume:   Normal  Mood:  Euthymic  Affect:  Congruent  Thought Process:  Logical  Orientation:  Full (Time, Place, and Person)  Thought Content: Logical  Suicidal Thoughts:  No  Homicidal Thoughts:  No  Memory:  Poor  Judgement:  Fair  Insight:  Lacking  Psychomotor Activity: Normal  Concentration:  Concentration: Good and Attention Span: Good  Recall:  Poor  Fund of Knowledge:  Fair  Language:  Fair  Akathisia:  NA  Handed:  Right  AIMS (if indicated):     Assets:  Communication Skills Desire for Improvement Housing Physical Health Social Support  ADL's:  Impaired  Cognition:  Impaired--moderate  Sleep:   Adequate overnight    Treatment Plan Summary: Continue involuntary commitment Daily contact with patient to assess and evaluate symptoms and progress in treatment and Medication management  Dementia with behavioral disturbances: -Continued Namenda 5 mg daily  Schizophrenia: -Continued Zyprexa 20 mg at bedtime -Continued Lamictal 150 mg BID  Disposition: Patient does not meet criteria for psychiatric inpatient admission. Supportive therapy provided about ongoing stressors.. case reviewed with social work regarding discharge planning.  Patient has had no behavioral concerns while admitted to the hospital.  Involuntary commitment can be rescinded as patient back at baseline mental status. Patient has been evaluated by physical therapy, recommending discharge to skilled nursing facility for strengthening prior to returning to group home.  Mariel CraftSHEILA M , MD 08/30/2018 6:27 PM

## 2018-08-30 NOTE — [place] FL2 (Signed)
[place] MEDICAID FL2 LEVEL OF CARE SCREENING TOOL     IDENTIFICATION  Patient Name: Timothy [name] Birthdate: 1932/07/16 Sex: male Admission Date (Current Location): 08/21/2018  [place]   Facility and Address:  [facility], [address], [place], [place] [address]      Provider Number: [id number]  Attending Physician Name and Address:  Milagros LollSudini, Srikar, MD  Relative Name and Phone Number:  Rebecka ApleyVanda is legal guardian with guardian services [phone]    Current Level of Care: Hospital Recommended Level of Care: Skilled Nursing Facility Prior Approval Number:    Date Approved/Denied:   PASRR Number: Pending  Discharge Plan: SNF    Current Diagnoses: Patient Active Problem List   Diagnosis Date Noted  . Pneumonia 08/26/2018  . Pressure ulcer 08/30/2015  . Atrial fibrillation and flutter (HCC) 08/29/2015  . Cerebral vascular disease 06/09/2015  . B12 deficiency 06/09/2015  . Hypokalemia 06/09/2015  . 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 Seizures (HCC) 11/28/2014  . TIA (transient ischemic attack) 11/28/2014  . Schizophrenia (HCC) 11/28/2014  . Hypertension 11/28/2014  . Dyslipidemia 11/28/2014  . Dementia with behavioral disturbance (HCC) 11/28/2014    Orientation RESPIRATION BLADDER Height & Weight     Self, Time, Situation, Place  Normal Continent Weight: 71.6 kg Height:  6\' 4"  (193 cm)  BEHAVIORAL SYMPTOMS/MOOD NEUROLOGICAL BOWEL NUTRITION STATUS    Convulsions/Seizures(history of seizures on Lamictal) Continent Diet  AMBULATORY STATUS COMMUNICATION OF NEEDS Skin   Limited Assist Verbally Normal                       Personal Care Assistance Level of Assistance  Bathing, Feeding, Dressing Bathing Assistance: Limited assistance Feeding assistance: Independent Dressing Assistance: Limited assistance     Functional Limitations Info  Sight, Hearing, Speech Sight Info: Adequate Hearing  Info: Adequate Speech Info: Adequate    SPECIAL CARE FACTORS FREQUENCY  PT (By licensed PT), OT (By licensed OT)     PT Frequency: 5 times per week OT Frequency: 5 times per week            Contractures Contractures Info: Not present    Additional Factors Info  Code Status Code Status Info: Full Allergies Info: NKA           Current Medications (08/30/2018):  This is the current hospital active medication list Current Facility-Administered Medications  Medication Dose Route Frequency Provider Last Rate Last Dose  . 0.9 %  sodium chloride infusion  250 mL Intravenous PRN Shaune Pollackhen, Qing, MD   Stopped at 08/27/18 2318  . acetaminophen (TYLENOL) tablet 650 mg  650 mg Oral Q6H PRN Shaune Pollackhen, Qing, MD   650 mg at 08/30/18 [id number]   Or  . acetaminophen (TYLENOL) suppository 650 mg  650 mg Rectal Q6H PRN Shaune Pollackhen, Qing, MD      . albuterol (PROVENTIL) (2.5 MG/3ML) 0.083% nebulizer solution 2.5 mg  2.5 mg Inhalation Q4H PRN Shaune Pollackhen, Qing, MD      . amLODipine (NORVASC) tablet 10 mg  10 mg Oral Daily Loleta RoseForbach, Cory, MD   10 mg at 08/30/18 0942  . aspirin chewable tablet 81 mg  81 mg Oral Daily Shaune Pollackhen, Qing, MD   81 mg at 08/30/18 [id number]  . atorvastatin (LIPITOR) tablet 10 mg  10 mg Oral Daily Loleta RoseForbach, Cory, MD   10 mg at 08/30/18 0942  . bisacodyl (DULCOLAX) EC tablet 5 mg  5 mg Oral Daily PRN Shaune Pollackhen, Qing, MD      .  enoxaparin (LOVENOX) injection 40 mg  40 mg Subcutaneous Q24H Demetrios Loll, MD   40 mg at 08/30/18 0942  . guaiFENesin (ROBITUSSIN) 100 MG/5ML solution 100 mg  5 mL Oral Q4H PRN Demetrios Loll, MD      . HYDROcodone-acetaminophen (NORCO/VICODIN) 5-325 MG per tablet 1-2 tablet  1-2 tablet Oral Q4H PRN Demetrios Loll, MD   1 tablet at 08/27/18 2329  . lamoTRIgine (LAMICTAL) tablet 150 mg  150 mg Oral BID Hinda Kehr, MD   150 mg at 08/30/18 0942  . levofloxacin (LEVAQUIN) tablet 750 mg  750 mg Oral Daily Demetrios Loll, MD   750 mg at 08/30/18 0942  . memantine (NAMENDA) tablet 5 mg  5 mg Oral Daily Hinda Kehr, MD   5 mg at 08/30/18 0942  . multivitamin with minerals tablet 1 tablet  1 tablet Oral Daily Demetrios Loll, MD   1 tablet at 08/30/18 [id number]  . nicotine (NICODERM CQ - dosed in mg/24 hours) patch 14 mg  14 mg Transdermal Daily Demetrios Loll, MD   14 mg at 08/30/18 0943  . OLANZapine (ZYPREXA) tablet 20 mg  20 mg Oral QHS Hinda Kehr, MD   20 mg at 08/29/18 2300  . ondansetron (ZOFRAN) tablet 4 mg  4 mg Oral Q6H PRN Demetrios Loll, MD       Or  . ondansetron [facility]) injection 4 mg  4 mg Intravenous Q6H PRN Demetrios Loll, MD      . senna-docusate (Senokot-S) tablet 1 tablet  1 tablet Oral QHS PRN Demetrios Loll, MD      . sodium chloride flush (NS) 0.9 % injection 3 mL  3 mL Intravenous Q12H Demetrios Loll, MD   3 mL at 08/30/18 1212  . sodium chloride flush (NS) 0.9 % injection 3 mL  3 mL Intravenous PRN Demetrios Loll, MD      . vitamin B-12 (CYANOCOBALAMIN) tablet 1,000 mcg  1,000 mcg Oral Daily Hinda Kehr, MD   1,000 mcg at 08/30/18 6010     Discharge Medications: Please see discharge summary for a list of discharge medications.  Relevant Imaging Results:  Relevant Lab Results:   Additional Information SS # [id number]  Shelbie Hutching, RN

## 2018-08-30 NOTE — Progress Notes (Signed)
[employer] -  at  [facility]   PATIENT NAME: Timothy [name]    MR#:  [id number]  DATE OF BIRTH:  28-Jul-1932  SUBJECTIVE:  CHIEF COMPLAINT:   Chief Complaint  Patient presents with  . Altered Mental Status   Sitter at bedside.  Pain right shoulder- 2 weeks  REVIEW OF SYSTEMS:  Review of Systems  Constitutional: Negative for chills, fever and malaise/fatigue.  HENT: Negative for sore throat.   Eyes: Negative for blurred vision and double vision.  Respiratory: Negative for cough, hemoptysis, sputum production, shortness of breath, wheezing and stridor.   Cardiovascular: Negative for chest pain, palpitations, orthopnea and leg swelling.  Gastrointestinal: Negative for abdominal pain, blood in stool, diarrhea, melena, nausea and vomiting.  Genitourinary: Negative for dysuria, flank pain and hematuria.  Musculoskeletal: Negative for back pain and joint pain.  Skin: Negative for rash.  Neurological: Negative for dizziness, sensory change, focal weakness, seizures, loss of consciousness, weakness and headaches.  Endo/Heme/Allergies: Negative for polydipsia.  Psychiatric/Behavioral: Negative for depression. The patient is not nervous/anxious.    DRUG ALLERGIES:  No Known Allergies VITALS:  Blood pressure (!) 142/74, pulse 85, temperature 98.7 F (37.1 C), temperature source Oral, resp. rate (!) 21, height 6\' 4"  (1.93 m), weight 71.6 kg, SpO2 95 %. PHYSICAL EXAMINATION:  Physical Exam Constitutional:      General: He is not in acute distress. HENT:     Head: Normocephalic.     Mouth/Throat:     Mouth: Mucous membranes are moist.  Eyes:     General: No scleral icterus.    Conjunctiva/sclera: Conjunctivae normal.     Pupils: Pupils are equal, round, and reactive to light.  Neck:     Musculoskeletal: Normal range of motion and neck supple.     Vascular: No JVD.     Trachea: No tracheal deviation.  Cardiovascular:     Rate and Rhythm: Normal rate  and regular rhythm.     Heart sounds: Normal heart sounds. No murmur. No gallop.   Pulmonary:     Effort: Pulmonary effort is normal. No respiratory distress.     Breath sounds: Normal breath sounds. No wheezing or rales.  Abdominal:     General: Bowel sounds are normal. There is no distension.     Palpations: Abdomen is soft.     Tenderness: There is no abdominal tenderness. There is no rebound.  Musculoskeletal: Normal range of motion.     Right shoulder: He exhibits tenderness.     Right lower leg: No edema.     Left lower leg: No edema.  Skin:    Findings: No erythema or rash.  Neurological:     General: No focal deficit present.     Mental Status: He is alert.     Cranial Nerves: No cranial nerve deficit.     Comments: Alert and awake    LABORATORY PANEL:  Male CBC Recent Labs  Lab 08/27/18 0432  WBC 6.0  HGB 12.9*  HCT 39.3  PLT 195   ------------------------------------------------------------------------------------------------------------------ Chemistries  Recent Labs  Lab 08/26/18 0711 08/27/18 0432  NA 138 139  K 4.3 3.7  CL 100 105  CO2 27 26  GLUCOSE 106* 119*  BUN 19 16  CREATININE 0.92 0.92  CALCIUM 9.2 8.5*  AST 39  --   ALT 21  --   ALKPHOS 50  --   BILITOT 1.4*  --    RADIOLOGY:  No results found. ASSESSMENT AND PLAN:   *  Pneumonia, possible aspiration pneumonia. He was treated with Zithromax and Rocephin in the ED, changed to cefepime and vancomycin IV.  Vancomycin was discontinued due to negative MRSA screen.  Changed to Levaquin p.o. Robitussin as needed.  Albuterol as needed. Since patient is improving with Levaquin I will treat for 5 days total.  *Right shoulder pain.  Will get x-ray to rule out dislocation or fracture.  Hypertension.  Continue home hypertension medication.  History of TIA.  Continue Lipitor, and aspirin.  History of seizure disorder.  Continue home medication.  Schizophrenia.  Continue home medication per  psych physician.  Tobacco abuse.  counseled to quit on admission  All the records are reviewed and case discussed with Care Management/Social Worker. Management plans discussed with the patient, family and they are in agreement.  CODE STATUS: Full Code  TOTAL TIME TAKING CARE OF THIS PATIENT: 25 minutes.   POSSIBLE D/C IN 1-2 DAYS, DEPENDING ON CLINICAL CONDITION.  Neita Carp M.D on 08/30/2018 at 11:02 AM  Between 7am to 6pm - Pager - [phone]  After 6pm go to [email or web address] - [facility]

## 2018-08-31 LAB — CULTURE, BLOOD (ROUTINE X 2)
Culture: NO GROWTH
Culture: NO GROWTH

## 2018-08-31 LAB — GLUCOSE, CAPILLARY: Glucose-Capillary: 93 mg/dL (ref 70–99)

## 2018-08-31 NOTE — Progress Notes (Signed)
[employer] - [place] at [facility]   PATIENT NAME: Timothy [name]    MR#:  [id number]  DATE OF BIRTH:  [birth year]  SUBJECTIVE:  CHIEF COMPLAINT:   Chief Complaint  Patient presents with  . Altered Mental Status   IVC discontinued by psychiatry team.  Waiting for skilled nursing facility.  Patient has no concerns.  REVIEW OF SYSTEMS:  Review of Systems  Constitutional: Negative for chills, fever and malaise/fatigue.  HENT: Negative for sore throat.   Eyes: Negative for blurred vision and double vision.  Respiratory: Negative for cough, hemoptysis, sputum production, shortness of breath, wheezing and stridor.   Cardiovascular: Negative for chest pain, palpitations, orthopnea and leg swelling.  Gastrointestinal: Negative for abdominal pain, blood in stool, diarrhea, melena, nausea and vomiting.  Genitourinary: Negative for dysuria, flank pain and hematuria.  Musculoskeletal: Negative for back pain and joint pain.  Skin: Negative for rash.  Neurological: Negative for dizziness, sensory change, focal weakness, seizures, loss of consciousness, weakness and headaches.  Endo/Heme/Allergies: Negative for polydipsia.  Psychiatric/Behavioral: Negative for depression. The patient is not nervous/anxious.    DRUG ALLERGIES:  No Known Allergies VITALS:  Blood pressure (!) 114/59, pulse 83, temperature 98.3 F (36.8 C), temperature source Oral, resp. rate 20, height 6\' 4"  (1.93 m), weight 71.6 kg, SpO2 96 %. PHYSICAL EXAMINATION:  Physical Exam Constitutional:      General: He is not in acute distress. HENT:     Head: Normocephalic.     Mouth/Throat:     Mouth: Mucous membranes are moist.  Eyes:     General: No scleral icterus.    Conjunctiva/sclera: Conjunctivae normal.     Pupils: Pupils are equal, round, and reactive to light.  Neck:     Musculoskeletal: Normal range of motion and neck supple.     Vascular: No JVD.     Trachea: No tracheal deviation.   Cardiovascular:     Rate and Rhythm: Normal rate and regular rhythm.     Heart sounds: Normal heart sounds. No murmur. No gallop.   Pulmonary:     Effort: Pulmonary effort is normal. No respiratory distress.     Breath sounds: Normal breath sounds. No wheezing or rales.  Abdominal:     General: Bowel sounds are normal. There is no distension.     Palpations: Abdomen is soft.     Tenderness: There is no abdominal tenderness. There is no rebound.  Musculoskeletal: Normal range of motion.     Right shoulder: He exhibits tenderness.     Right lower leg: No edema.     Left lower leg: No edema.  Skin:    Findings: No erythema or rash.  Neurological:     General: No focal deficit present.     Mental Status: He is alert.     Cranial Nerves: No cranial nerve deficit.     Comments: Alert and awake    LABORATORY PANEL:  Male CBC Recent Labs  Lab 08/27/18 0432  WBC 6.0  HGB 12.9*  HCT 39.3  PLT 195   ------------------------------------------------------------------------------------------------------------------ Chemistries  Recent Labs  Lab 08/26/18 0711 08/27/18 0432  NA 138 139  K 4.3 3.7  CL 100 105  CO2 27 26  GLUCOSE 106* 119*  BUN 19 16  CREATININE 0.92 0.92  CALCIUM 9.2 8.5*  AST 39  --   ALT 21  --   ALKPHOS 50  --   BILITOT 1.4*  --    RADIOLOGY:  Dg Shoulder  Right  Result Date: 08/30/2018 CLINICAL DATA:  Pain with no injury EXAM: RIGHT SHOULDER - 2+ VIEW COMPARISON:  None. FINDINGS: Mild right AC joint degenerative change. No fracture or malalignment. Calcifications at the superolateral humeral head. Mild inferior glenohumeral degenerative change. IMPRESSION: 1. No acute osseous abnormality. 2. Calcific tendinitis 3. Degenerative changes of the [facility] joint and glenohumeral joint. Electronically Signed   By: Donavan Foil M.D.   On: 08/30/2018 14:51   ASSESSMENT AND PLAN:   * Pneumonia, possible aspiration pneumonia. Initially on broad-spectrum IV  antibiotics.  Switch to Levaquin and doing well.  Needs 5 days total.  *Right shoulder pain.  X-ray showed 1. No acute osseous abnormality. 2. Calcific tendinitis 3. Degenerative changes of the [facility] joint and glenohumeral joint.  Hypertension.  Continue home hypertension medication.  History of TIA.  Continue Lipitor, and aspirin.  History of seizure disorder.  Continue home medication.  Schizophrenia.  Continue home medication per psych physician.  Tobacco abuse.  counseled to quit on admission  All the records are reviewed and case discussed with Care Management/Social Worker. Management plans discussed with the patient, family and they are in agreement.  CODE STATUS: Full Code  TOTAL TIME TAKING CARE OF THIS PATIENT: 30 minutes.   POSSIBLE D/C IN 1-2 DAYS, DEPENDING ON CLINICAL CONDITION.  Neita Carp M.D on 08/31/2018 at 12:44 PM  Between 7am to 6pm - Pager - [phone]  After 6pm go to [email or web address] - [employer] [facility]

## 2018-08-31 NOTE — Care Management Important Message (Signed)
Important Message  Patient Details  Name: Timothy [name] MRN: [id number] Date of Birth: 12-14-1932   Medicare Important Message Given:  Yes  Merrie Roof returned previous call.  Aware of right.  Requested copy be sent to her attention at [phone] to place in patient's records.    Dannette Barbara 08/31/2018, 4:15 PM

## 2018-08-31 NOTE — TOC Progression Note (Signed)
Transition of Care [facility]) - Progression Note    Patient Details  Name: Timothy [name] MRN: [id number] Date of Birth: September 28, 1932  Transition of Care [facility]) CM/SW [place], RN Phone Number: 08/31/2018, 9:46 AM  Clinical Narrative:    Uploaded H&P, 30 day note, Psych notes and Fl2 to [place] must as requested, awaiting PASSR   Expected Discharge Plan: Psychiatric Hospital Barriers to Discharge: Continued Medical Work up, Psych Bed not available  Expected Discharge Plan and Services Expected Discharge Plan: [facility] In-house Referral: Clinical Social Work Discharge Planning Services: CM Consult   Living arrangements for the past 2 months: Group Home                                       Social Determinants of Health (SDOH) Interventions    Readmission Risk Interventions No flowsheet data found.

## 2018-08-31 NOTE — Care Management Important Message (Signed)
Important Message  Patient Details  Name: Timothy [name] MRN: [id number] Date of Birth: [birth year]   Medicare Important Message Given:  Other (see comment)  Left message with patient's legal guardian, Merrie Roof, at [phone] to review Medicare IM.  Encouraged callback.     Dannette Barbara 08/31/2018, 11:24 AM

## 2018-09-01 NOTE — Progress Notes (Addendum)
[employer] - [place] at [facility]   PATIENT NAME: Timothy [name]    MR#:  [id number]  DATE OF BIRTH:  [birth year]  SUBJECTIVE:  Patient without complaint, IVC discontinued by psychiatry team, awaiting SNF placement   REVIEW OF SYSTEMS:  Review of Systems  Constitutional: Negative for chills, fever and malaise/fatigue.  HENT: Negative for sore throat.   Eyes: Negative for blurred vision and double vision.  Respiratory: Negative for cough, hemoptysis, sputum production, shortness of breath, wheezing and stridor.   Cardiovascular: Negative for chest pain, palpitations, orthopnea and leg swelling.  Gastrointestinal: Negative for abdominal pain, blood in stool, diarrhea, melena, nausea and vomiting.  Genitourinary: Negative for dysuria, flank pain and hematuria.  Musculoskeletal: Negative for back pain and joint pain.  Skin: Negative for rash.  Neurological: Negative for dizziness, sensory change, focal weakness, seizures, loss of consciousness, weakness and headaches.  Endo/Heme/Allergies: Negative for polydipsia.  Psychiatric/Behavioral: Negative for depression. The patient is not nervous/anxious.    DRUG ALLERGIES:  No Known Allergies VITALS:  Blood pressure (!) 154/85, pulse 66, temperature 98 F (36.7 C), temperature source Oral, resp. rate 20, height 6\' 4"  (1.93 m), weight 71.6 kg, SpO2 95 %. PHYSICAL EXAMINATION:  Physical Exam Constitutional:      General: He is not in acute distress. HENT:     Head: Normocephalic.     Mouth/Throat:     Mouth: Mucous membranes are moist.  Eyes:     General: No scleral icterus.    Conjunctiva/sclera: Conjunctivae normal.     Pupils: Pupils are equal, round, and reactive to light.  Neck:     Musculoskeletal: Normal range of motion and neck supple.     Vascular: No JVD.     Trachea: No tracheal deviation.  Cardiovascular:     Rate and Rhythm: Normal rate and regular rhythm.     Heart sounds: Normal heart sounds.  No murmur. No gallop.   Pulmonary:     Effort: Pulmonary effort is normal. No respiratory distress.     Breath sounds: Normal breath sounds. No wheezing or rales.  Abdominal:     General: Bowel sounds are normal. There is no distension.     Palpations: Abdomen is soft.     Tenderness: There is no abdominal tenderness. There is no rebound.  Musculoskeletal: Normal range of motion.     Right shoulder: He exhibits tenderness.     Right lower leg: No edema.     Left lower leg: No edema.  Skin:    Findings: No erythema or rash.  Neurological:     General: No focal deficit present.     Mental Status: He is alert.     Cranial Nerves: No cranial nerve deficit.     Comments: Alert and awake    LABORATORY PANEL:  Male CBC Recent Labs  Lab 08/27/18 0432  WBC 6.0  HGB 12.9*  HCT 39.3  PLT 195   ------------------------------------------------------------------------------------------------------------------ Chemistries  Recent Labs  Lab 08/26/18 0711 08/27/18 0432  NA 138 139  K 4.3 3.7  CL 100 105  CO2 27 26  GLUCOSE 106* 119*  BUN 19 16  CREATININE 0.92 0.92  CALCIUM 9.2 8.5*  AST 39  --   ALT 21  --   ALKPHOS 50  --   BILITOT 1.4*  --    RADIOLOGY:  No results found. ASSESSMENT AND PLAN:  * Pneumonia, possible aspiration pneumonia Treated with 5-day course of antibiotics  *Right shoulder pain X-ray  shown: 1. No acute osseous abnormality. 2. Calcific tendinitis 3. Degenerative changes of the [facility] joint and glenohumeral joint  *Hypertension Stable on current regiment  *History of TIA Continue Lipitor, and aspirin.  *History of seizure disorder Continue home medication.  *Schizophrenia Continue home medication per psych physician.  *Tobacco abuse counseled to quit on admission  All the records are reviewed and case discussed with Care Management/Social Worker. Management plans discussed with the patient, family and they are in agreement.  CODE  STATUS: Full Code  TOTAL TIME TAKING CARE OF THIS PATIENT: 30 minutes.   POSSIBLE D/C IN 1-2 DAYS, DEPENDING ON CLINICAL CONDITION.  Avel Peace Donnalynn Wheeless M.D on 09/01/2018 at 11:04 AM  Between 7am to 6pm - Pager - [phone]  After 6pm go to [email or web address] - [employer] [facility]

## 2018-09-01 NOTE — Plan of Care (Signed)

## 2018-09-02 LAB — CREATININE, SERUM
Creatinine, Ser: 0.88 mg/dL (ref 0.61–1.24)
GFR calc Af Amer: >60 mL/min (ref >60)
GFR calc non Af Amer: >60 mL/min (ref >60)

## 2018-09-02 MED ORDER — HYDROCODONE-ACETAMINOPHEN 5-325 MG PO TABS
1.0000 | ORAL_TABLET | Freq: Four times a day (QID) | ORAL | Status: DC | PRN
  Filled 2018-09-02: qty 1

## 2018-09-02 NOTE — Progress Notes (Addendum)
[employer] - [place] at [facility]   PATIENT NAME: Timothy [name]    MR#:  [id number]  DATE OF BIRTH:  1932-12-26  SUBJECTIVE:  Patient without complaint, no events overnight, awaiting SNF placement   REVIEW OF SYSTEMS:  Review of Systems  Constitutional: Negative for chills, fever and malaise/fatigue.  HENT: Negative for sore throat.   Eyes: Negative for blurred vision and double vision.  Respiratory: Negative for cough, hemoptysis, sputum production, shortness of breath, wheezing and stridor.   Cardiovascular: Negative for chest pain, palpitations, orthopnea and leg swelling.  Gastrointestinal: Negative for abdominal pain, blood in stool, diarrhea, melena, nausea and vomiting.  Genitourinary: Negative for dysuria, flank pain and hematuria.  Musculoskeletal: Negative for back pain and joint pain.  Skin: Negative for rash.  Neurological: Negative for dizziness, sensory change, focal weakness, seizures, loss of consciousness, weakness and headaches.  Endo/Heme/Allergies: Negative for polydipsia.  Psychiatric/Behavioral: Negative for depression. The patient is not nervous/anxious.    DRUG ALLERGIES:  No Known Allergies VITALS:  Blood pressure 138/72, pulse 88, temperature 98.6 F (37 C), temperature source Oral, resp. rate 16, height 6\' 4"  (1.93 m), weight 71.6 kg, SpO2 95 %. PHYSICAL EXAMINATION:  Physical Exam Constitutional:      General: He is not in acute distress. HENT:     Head: Normocephalic.     Mouth/Throat:     Mouth: Mucous membranes are moist.  Eyes:     General: No scleral icterus.    Conjunctiva/sclera: Conjunctivae normal.     Pupils: Pupils are equal, round, and reactive to light.  Neck:     Musculoskeletal: Normal range of motion and neck supple.     Vascular: No JVD.     Trachea: No tracheal deviation.  Cardiovascular:     Rate and Rhythm: Normal rate and regular rhythm.     Heart sounds: Normal heart sounds. No murmur. No  gallop.   Pulmonary:     Effort: Pulmonary effort is normal. No respiratory distress.     Breath sounds: Normal breath sounds. No wheezing or rales.  Abdominal:     General: Bowel sounds are normal. There is no distension.     Palpations: Abdomen is soft.     Tenderness: There is no abdominal tenderness. There is no rebound.  Musculoskeletal: Normal range of motion.     Right shoulder: He exhibits tenderness.     Right lower leg: No edema.     Left lower leg: No edema.  Skin:    Findings: No erythema or rash.  Neurological:     General: No focal deficit present.     Mental Status: He is alert.     Cranial Nerves: No cranial nerve deficit.     Comments: Alert and awake    LABORATORY PANEL:  Male CBC Recent Labs  Lab 08/27/18 0432  WBC 6.0  HGB 12.9*  HCT 39.3  PLT 195   ------------------------------------------------------------------------------------------------------------------ Chemistries  Recent Labs  Lab 08/27/18 0432 09/02/18 0501  NA 139  --   K 3.7  --   CL 105  --   CO2 26  --   GLUCOSE 119*  --   BUN 16  --   CREATININE 0.92 0.88  CALCIUM 8.5*  --    RADIOLOGY:  No results found. ASSESSMENT AND PLAN:  * Pneumonia, possible aspiration pneumonia Resolved Treated with 5-day course of antibiotics  *Right shoulder pain X-ray shown: 1. No acute osseous abnormality. 2. Calcific tendinitis 3. Degenerative changes  of the [facility] joint and glenohumeral joint  *Hypertension Stable on current regiment  *History of TIA Continue Lipitor, and aspirin.  *History of seizure disorder Continue home medication.  *Schizophrenia Continue home medication per psych physician.  *Tobacco abuse counseled to quit on admission  Disposition to skilled nursing facility when bed is available  All the records are reviewed and case discussed with Care Management/Social Worker. Management plans discussed with the patient, family and they are in agreement.  CODE  STATUS: Full Code  TOTAL TIME TAKING CARE OF THIS PATIENT: 30 minutes.   POSSIBLE D/C IN 1-2 DAYS, DEPENDING ON CLINICAL CONDITION.  Avel Peace Chaunta Bejarano M.D on 09/02/2018 at 11:17 AM  Between 7am to 6pm - Pager - [phone]  After 6pm go to [email or web address] - [employer] [facility]

## 2018-09-03 LAB — ABO/RH: ABO/RH(D): A POS

## 2018-09-03 LAB — SARS CORONAVIRUS 2 BY RT PCR (HOSPITAL ORDER, PERFORMED IN ~~LOC~~ HOSPITAL LAB): SARS Coronavirus 2: NEGATIVE

## 2018-09-03 MED ORDER — NICOTINE 14 MG/24HR TD PT24: 14.0000 mg | MEDICATED_PATCH | Freq: Every day | TRANSDERMAL | 0 refills | Status: AC

## 2018-09-03 MED ORDER — ADULT MULTIVITAMIN W/MINERALS CH: 1.0000 | ORAL_TABLET | Freq: Every day | ORAL | 0 refills | Status: AC

## 2018-09-03 MED ORDER — ASPIRIN EC 81 MG PO TBEC: 81.0000 mg | DELAYED_RELEASE_TABLET | Freq: Every day | ORAL | 0 refills | Status: AC

## 2018-09-03 NOTE — Progress Notes (Signed)
Nutrition Follow-up   DOCUMENTATION CODES:   Not applicable  INTERVENTION:  Continue Hormel Shake (Vital Cuisine) po TID with trays, each supplement provides 520 kcal and 22 grams of protein.  Continue daily MVI.  NUTRITION DIAGNOSIS:   Inadequate oral intake related to poor appetite as evidenced by (per chart).  Resolving.  GOAL:   Patient will meet greater than or equal to 90% of their needs  Progressing.  MONITOR:   PO intake, Supplement acceptance, Labs, Weight trends, I & O's  REASON FOR ASSESSMENT:   Malnutrition Screening Tool    ASSESSMENT:   85 year old male with PMHx of schizophrenia, seizures, hx TIA, HTN who has been in ED for geriatric psych placement now admitted with PNA.  Patient's PO intake is improving. He is now eating 75-85% of meals per chart. Patient is receiving Hormel Shakes on trays. Per chart he has now been accepted at [facility] and should be able to discharge soon.  Medications reviewed and include: MVI daily, nicotine patch, vitamin B12 1000 micrograms daily.  Labs reviewed.  Diet Order:   Diet Order            DIET DYS 2 Room service appropriate? Yes with Assist; Fluid consistency: Nectar Thick  Diet effective now             EDUCATION NEEDS:   No education needs have been identified at this time  Skin:  Skin Assessment: Reviewed RN Assessment  Last BM:  Unknown  Height:   Ht Readings from Last 1 Encounters:  08/26/18 6\' 4"  (1.93 m)   Weight:   Wt Readings from Last 1 Encounters:  08/26/18 71.6 kg   Ideal Body Weight:  91.8 kg  BMI:  Body mass index is 19.21 kg/m.  Estimated Nutritional Needs:   Kcal:  1800-2000  Protein:  90-100 grams  Fluid:  1.8-2 L/day  Willey Blade, MS, RD, LDN Office: [phone] Pager: [phone] After Hours/Weekend Pager: [phone]

## 2018-09-03 NOTE — Care Management Important Message (Signed)
Important Message  Patient Details  Name: Cornellius Kropp MRN: [id number] Date of Birth: Jul 31, 1932   Medicare Important Message Given:  Yes     Juliann Pulse A Jaclynne Baldo 09/03/2018, 11:09 AM

## 2018-09-03 NOTE — TOC Transition Note (Signed)
Transition of Care [facility]) - CM/SW Discharge Note   Patient Details  Name: Timothy [name] MRN: [id number] Date of Birth: Oct 03, 1932  Transition of Care [facility]) CM/SW Contact:  Shelbie Hutching, RN Phone Number: 09/03/2018, 1:39 PM   Clinical Narrative:    Patient will discharge to the [facility] in [place] this afternoon.  Patient's legal guardian Porfirio Mylar has been notified.  Patient will transport by EMS.    Final next level of care: [place] Barriers to Discharge: Barriers Resolved   Patient Goals and CMS Choice        Discharge Placement PASRR number recieved: 09/03/18            Patient chooses bed at: [facility] Patient to be transferred to facility by: [facility] Name of family member notified: Legal Guardian Porfirio Mylar Patient and family notified of of transfer: 09/03/18  Discharge Plan and Services In-house Referral: Clinical Social Work Discharge Planning Services: CM Consult                                 Social Determinants of Health (SDOH) Interventions     Readmission Risk Interventions No flowsheet data found.

## 2018-09-03 NOTE — [place] FL2 (Signed)
[place] MEDICAID FL2 LEVEL OF CARE SCREENING TOOL     IDENTIFICATION  Patient Name: Timothy [name] Birthdate: 09-03-1932 Sex: male Admission Date (Current Location): 08/21/2018  [place]   Facility and Address:  [facility], [address], [place], [place] [address]      Provider Number: [id number]  Attending Physician Name and Address:  Bertrum SolSalary, Montell D, MD  Relative Name and Phone Number:  Rebecka ApleyVanda is legal guardian with guardian services [phone]    Current Level of Care: Hospital Recommended Level of Care: Skilled Nursing Facility Prior Approval Number:    Date Approved/Denied:   PASRR Number: [id number] F- start 09/03/18 end 10/03/18  Discharge Plan: SNF    Current Diagnoses: Patient Active Problem List   Diagnosis Date Noted  . Pneumonia 08/26/2018  . Pressure ulcer 08/30/2015  . Atrial fibrillation and flutter (HCC) 08/29/2015  . Cerebral vascular disease 06/09/2015  . B12 deficiency 06/09/2015  . Hypokalemia 06/09/2015  . 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 Seizures (HCC) 11/28/2014  . TIA (transient ischemic attack) 11/28/2014  . Schizophrenia (HCC) 11/28/2014  . Hypertension 11/28/2014  . Dyslipidemia 11/28/2014  . Dementia with behavioral disturbance (HCC) 11/28/2014    Orientation RESPIRATION BLADDER Height & Weight     Self, Time, Situation, Place  Normal Continent Weight: 71.6 kg Height:  6\' 4"  (193 cm)  BEHAVIORAL SYMPTOMS/MOOD NEUROLOGICAL BOWEL NUTRITION STATUS    Convulsions/Seizures(history of seizures on Lamictal) Continent Diet  AMBULATORY STATUS COMMUNICATION OF NEEDS Skin   Limited Assist Verbally Normal                       Personal Care Assistance Level of Assistance  Bathing, Feeding, Dressing Bathing Assistance: Limited assistance Feeding assistance: Independent Dressing Assistance: Limited assistance     Functional Limitations Info  Sight, Hearing, Speech  Sight Info: Adequate Hearing Info: Adequate Speech Info: Adequate    SPECIAL CARE FACTORS FREQUENCY  PT (By licensed PT), OT (By licensed OT)     PT Frequency: 5 times per week OT Frequency: 5 times per week            Contractures Contractures Info: Not present    Additional Factors Info  Code Status Code Status Info: Full Allergies Info: NKA           Current Medications (09/03/2018):  This is the current hospital active medication list Current Facility-Administered Medications  Medication Dose Route Frequency Provider Last Rate Last Dose  . 0.9 %  sodium chloride infusion  250 mL Intravenous PRN Shaune Pollackhen, Qing, MD   Stopped at 08/27/18 2318  . acetaminophen (TYLENOL) tablet 650 mg  650 mg Oral Q6H PRN Shaune Pollackhen, Qing, MD   650 mg at 08/30/18 [id number]   Or  . acetaminophen (TYLENOL) suppository 650 mg  650 mg Rectal Q6H PRN Shaune Pollackhen, Qing, MD      . albuterol (PROVENTIL) (2.5 MG/3ML) 0.083% nebulizer solution 2.5 mg  2.5 mg Inhalation Q4H PRN Shaune Pollackhen, Qing, MD      . amLODipine (NORVASC) tablet 10 mg  10 mg Oral Daily Loleta RoseForbach, Cory, MD   10 mg at 09/02/18 0850  . aspirin chewable tablet 81 mg  81 mg Oral Daily Shaune Pollackhen, Qing, MD   81 mg at 09/02/18 0851  . atorvastatin (LIPITOR) tablet 10 mg  10 mg Oral Daily Loleta RoseForbach, Cory, MD   10 mg at 09/02/18 0851  . bisacodyl (DULCOLAX) EC tablet 5 mg  5 mg Oral Daily  PRN Demetrios Loll, MD      . enoxaparin (LOVENOX) injection 40 mg  40 mg Subcutaneous Q24H Demetrios Loll, MD   40 mg at 09/02/18 0851  . guaiFENesin (ROBITUSSIN) 100 MG/5ML solution 100 mg  5 mL Oral Q4H PRN Demetrios Loll, MD      . HYDROcodone-acetaminophen (NORCO/VICODIN) 5-325 MG per tablet 1 tablet  1 tablet Oral Q6H PRN Salary, Montell D, MD      . lamoTRIgine (LAMICTAL) tablet 150 mg  150 mg Oral BID Hinda Kehr, MD   150 mg at 09/02/18 2050  . memantine (NAMENDA) tablet 5 mg  5 mg Oral Daily Hinda Kehr, MD   5 mg at 09/02/18 0851  . multivitamin with minerals tablet 1 tablet  1 tablet Oral  Daily Demetrios Loll, MD   1 tablet at 09/02/18 [phone]  . nicotine (NICODERM CQ - dosed in mg/24 hours) patch 14 mg  14 mg Transdermal Daily Demetrios Loll, MD   14 mg at 09/02/18 0851  . OLANZapine (ZYPREXA) tablet 20 mg  20 mg Oral QHS Hinda Kehr, MD   20 mg at 09/02/18 2050  . ondansetron (ZOFRAN) tablet 4 mg  4 mg Oral Q6H PRN Demetrios Loll, MD       Or  . ondansetron [facility]) injection 4 mg  4 mg Intravenous Q6H PRN Demetrios Loll, MD      . senna-docusate (Senokot-S) tablet 1 tablet  1 tablet Oral QHS PRN Demetrios Loll, MD      . sodium chloride flush (NS) 0.9 % injection 3 mL  3 mL Intravenous Q12H Demetrios Loll, MD   3 mL at 09/02/18 2113  . sodium chloride flush (NS) 0.9 % injection 3 mL  3 mL Intravenous PRN Demetrios Loll, MD      . vitamin B-12 (CYANOCOBALAMIN) tablet 1,000 mcg  1,000 mcg Oral Daily Hinda Kehr, MD   1,000 mcg at 09/02/18 5681     Discharge Medications: Please see discharge summary for a list of discharge medications.  Relevant Imaging Results:  Relevant Lab Results:   Additional Information SS # [id number]  Shelbie Hutching, RN

## 2018-09-03 NOTE — Discharge Summary (Signed)
[facility] -  at [facility]   PATIENT NAME: Timothy [name]    MR#:  [id number]  DATE OF BIRTH:  March 21, 1932  DATE OF ADMISSION:  08/21/2018 ADMITTING PHYSICIAN: Shaune PollackQing Chen, MD  DATE OF DISCHARGE: No discharge date for patient encounter.  PRIMARY CARE PHYSICIAN: [facility], [facility]    ADMISSION DIAGNOSIS:  Aggressive behavior [R46.89] Screening for cardiovascular, respiratory, and genitourinary diseases [Z13.6, Z13.89, Z13.83] Acute cystitis without hematuria [N30.00] Altered mental status, unspecified altered mental status type [R41.82] Pneumonia of right lower lobe due to infectious organism (HCC) [J18.1]  DISCHARGE DIAGNOSIS:  Principal Problem:   Dementia with behavioral disturbance (HCC) Active Problems:   Undifferentiated schizophrenia (HCC)   Pneumonia   SECONDARY DIAGNOSIS:   Past Medical History:  Diagnosis Date  . Hypertension   . Schizophrenia (HCC)   . Seizures (HCC)   . TIA (transient ischemic attack)     HOSPITAL COURSE:  * Pneumonia, possible aspiration pneumonia Resolved Treated with 5-day course of antibiotics  *Right shoulder pain controlled X-ray shown: 1. No acute osseous abnormality. 2. Calcific tendinitis 3. Degenerative changes of the [facility] joint and glenohumeral joint  *Hypertension Stable on current regiment  *History of TIA Continued Lipitor, and aspirin.  *History of seizure disorder Continued home medication.  *Schizophrenia Continued home medication per psych physician.  *Tobacco abuse counseled to quit on admission  DISCHARGE CONDITIONS:   stable  CONSULTS OBTAINED:    DRUG ALLERGIES:  No Known Allergies  DISCHARGE MEDICATIONS:   Allergies as of 09/03/2018   No Known Allergies     Medication List    TAKE these medications   acetaminophen 325 MG tablet Commonly known as: Tylenol Take 2 tablets (650 mg total) by mouth 2 (two) times daily. As needed What  changed:   when to take this  reasons to take this  Another medication with the same name was removed. Continue taking this medication, and follow the directions you see here.   amLODipine 10 MG tablet Commonly known as: NORVASC Take 1 tablet (10 mg total) by mouth daily.   aspirin EC 81 MG tablet Take 1 tablet (81 mg total) by mouth daily.   atorvastatin 10 MG tablet Commonly known as: LIPITOR TAKE 1 TABLET BY MOUTH DAILY. What changed: when to take this   lamoTRIgine 150 MG tablet Commonly known as: LAMICTAL Take 150 mg by mouth 2 (two) times daily.   memantine 5 MG tablet Commonly known as: NAMENDA TAKE 1 TABLET BY MOUTH DAILY   multivitamin with minerals Tabs tablet Take 1 tablet by mouth daily.   nicotine 14 mg/24hr patch Commonly known as: NICODERM CQ - dosed in mg/24 hours Place 1 patch (14 mg total) onto the skin daily.   OLANZapine 10 MG tablet Commonly known as: ZYPREXA Take 20 mg by mouth at bedtime.   vitamin B-12 1000 MCG tablet Commonly known as: CYANOCOBALAMIN TAKE 1 TABLET BY MOUTH DAILY.        DISCHARGE INSTRUCTIONS:      If you experience worsening of your admission symptoms, develop shortness of breath, life threatening emergency, suicidal or homicidal thoughts you must seek medical attention immediately by calling 911 or calling your MD immediately  if symptoms less severe.  You Must read complete instructions/literature along with all the possible adverse reactions/side effects for all the Medicines you take and that have been prescribed to you. Take any new Medicines after you have completely understood and accept all the possible adverse reactions/side effects.  Please note  You were cared for by a hospitalist during your hospital stay. If you have any questions about your discharge medications or the care you received while you were in the hospital after you are discharged, you can call the unit and asked to speak with the  hospitalist on call if the hospitalist that took care of you is not available. Once you are discharged, your primary care physician will handle any further medical issues. Please note that NO REFILLS for any discharge medications will be authorized once you are discharged, as it is imperative that you return to your primary care physician (or establish a relationship with a primary care physician if you do not have one) for your aftercare needs so that they can reassess your need for medications and monitor your lab values.    Today   CHIEF COMPLAINT:   Chief Complaint  Patient presents with  . Altered Mental Status    HISTORY OF PRESENT ILLNESS:  85 y.o. male with a known history of hypertension, schizophrenia, seizure disorder and TIA.  The patient has been in ED for geriatric psych placement.  He is noticed fever and worsening cough.  Chest x-ray report possible pneumonia.  The patient denies any chills, shortness of breath, nausea, vomiting or leg edema.  ED physician gave her antibiotics and request admission.   VITAL SIGNS:  Blood pressure 131/75, pulse 80, temperature 98.7 F (37.1 C), temperature source Oral, resp. rate 18, height 6\' 4"  (1.93 m), weight 71.6 kg, SpO2 95 %.  I/O:    Intake/Output Summary (Last 24 hours) at 09/03/2018 1014 Last data filed at 09/03/2018 0335 Gross per 24 hour  Intake 60 ml  Output 500 ml  Net -440 ml    PHYSICAL EXAMINATION:  GENERAL:  85 y.o.-year-old patient lying in the bed with no acute distress.  EYES: Pupils equal, round, reactive to light and accommodation. No scleral icterus. Extraocular muscles intact.  HEENT: Head atraumatic, normocephalic. Oropharynx and nasopharynx clear.  NECK:  Supple, no jugular venous distention. No thyroid enlargement, no tenderness.  LUNGS: Normal breath sounds bilaterally, no wheezing, rales,rhonchi or crepitation. No use of accessory muscles of respiration.  CARDIOVASCULAR: S1, S2 normal. No murmurs, rubs,  or gallops.  ABDOMEN: Soft, non-tender, non-distended. Bowel sounds present. No organomegaly or mass.  EXTREMITIES: No pedal edema, cyanosis, or clubbing.  NEUROLOGIC: Cranial nerves II through XII are intact. Muscle strength 5/5 in all extremities. Sensation intact. Gait not checked.  PSYCHIATRIC: The patient is alert and oriented x 3.  SKIN: No obvious rash, lesion, or ulcer.   DATA REVIEW:   CBC No results for input(s): WBC, HGB, HCT, PLT in the last 168 hours.  Chemistries  Recent Labs  Lab 09/02/18 0501  CREATININE 0.88    Cardiac Enzymes No results for input(s): TROPONINI in the last 168 hours.  Microbiology Results  Results for orders placed or performed during the hospital encounter of 08/21/18  SARS Coronavirus 2 (CEPHEID - Performed in [facility] lab), Hosp Order     Status: None   Collection Time: 08/22/18 11:55 AM   Specimen: Nasopharyngeal Swab  Result Value Ref Range Status   SARS Coronavirus 2 NEGATIVE NEGATIVE Final    Comment: (NOTE) If result is NEGATIVE SARS-CoV-2 target nucleic acids are NOT DETECTED. The SARS-CoV-2 RNA is generally detectable in upper and lower  respiratory specimens during the acute phase of infection. The lowest  concentration of SARS-CoV-2 viral copies this assay can detect is 250  copies / mL. A negative result does not preclude SARS-CoV-2 infection  and should not be used as the sole basis for treatment or other  patient management decisions.  A negative result may occur with  improper specimen collection / handling, submission of specimen other  than nasopharyngeal swab, presence of viral mutation(s) within the  areas targeted by this assay, and inadequate number of viral copies  (<250 copies / mL). A negative result must be combined with clinical  observations, patient history, and epidemiological information. If result is POSITIVE SARS-CoV-2 target nucleic acids are DETECTED. The SARS-CoV-2 RNA is generally  detectable in upper and lower  respiratory specimens dur ing the acute phase of infection.  Positive  results are indicative of active infection with SARS-CoV-2.  Clinical  correlation with patient history and other diagnostic information is  necessary to determine patient infection status.  Positive results do  not rule out bacterial infection or co-infection with other viruses. If result is PRESUMPTIVE POSTIVE SARS-CoV-2 nucleic acids MAY BE PRESENT.   A presumptive positive result was obtained on the submitted specimen  and confirmed on repeat testing.  While 2019 novel coronavirus  (SARS-CoV-2) nucleic acids may be present in the submitted sample  additional confirmatory testing may be necessary for epidemiological  and / or clinical management purposes  to differentiate between  SARS-CoV-2 and other Sarbecovirus currently known to infect humans.  If clinically indicated additional testing with an alternate test  methodology [phone]) is advised. The SARS-CoV-2 RNA is generally  detectable in upper and lower respiratory sp ecimens during the acute  phase of infection. The expected result is Negative. Fact Sheet for Patients:  BoilerBrush.com.cy Fact Sheet for Healthcare Providers: https://pope.com/ This test is not yet approved or cleared by the [place] FDA and has been authorized for detection and/or diagnosis of SARS-CoV-2 by FDA under an Emergency Use Authorization (EUA).  This EUA will remain in effect (meaning this test can be used) for the duration of the COVID-19 declaration under Section 564(b)(1) of the Act, 21 U.S.C. section 360bbb-3(b)(1), unless the authorization is terminated or revoked sooner. Performed at [facility], [address], [place], [place] [address]   Urine culture     Status: Abnormal   Collection Time: 08/24/18  7:15 AM   Specimen: Urine, Random  Result Value Ref Range Status    Specimen Description   Final    URINE, RANDOM Performed at [facility], [address], [place], [place] [address]    Special Requests   Final    NONE Performed at [facility], [address] [place], [place] [address]    Culture (A)  Final    <10,000 COLONIES/mL INSIGNIFICANT GROWTH Performed at [facility], [address] [place], [place] [address]    Report Status 08/25/2018 FINAL  Final  Blood culture (routine x 2)     Status: None   Collection Time: 08/26/18  7:05 AM   Specimen: BLOOD  Result Value Ref Range Status   Specimen Description BLOOD LT [facility]  Final   Special Requests   Final    BOTTLES DRAWN AEROBIC AND ANAEROBIC Blood Culture results may not be optimal due to an excessive volume of blood received in culture bottles   Culture   Final    NO GROWTH 5 DAYS Performed at [facility], [address], [place], [place] [address]    Report Status 08/31/2018 FINAL  Final  Blood culture (routine x 2)  Status: None   Collection Time: 08/26/18  7:36 AM   Specimen: BLOOD  Result Value Ref Range Status   Specimen Description BLOOD L ARM  Final   Special Requests   Final    BOTTLES DRAWN AEROBIC AND ANAEROBIC Blood Culture results may not be optimal due to an excessive volume of blood received in culture bottles   Culture   Final    NO GROWTH 5 DAYS Performed at [facility], [address] [place], [place] [address]    Report Status 08/31/2018 FINAL  Final  SARS Coronavirus 2 (CEPHEID - Performed in [facility] hospital lab), Hosp Order     Status: None   Collection Time: 08/26/18  7:37 AM   Specimen: Nasopharyngeal Swab  Result Value Ref Range Status   SARS Coronavirus 2 NEGATIVE NEGATIVE Final    Comment: (NOTE) If result is NEGATIVE SARS-CoV-2 target nucleic acids are NOT DETECTED. The SARS-CoV-2 RNA is generally detectable in upper and lower  respiratory specimens during the acute phase of infection. The lowest   concentration of SARS-CoV-2 viral copies this assay can detect is 250  copies / mL. A negative result does not preclude SARS-CoV-2 infection  and should not be used as the sole basis for treatment or other  patient management decisions.  A negative result may occur with  improper specimen collection / handling, submission of specimen other  than nasopharyngeal swab, presence of viral mutation(s) within the  areas targeted by this assay, and inadequate number of viral copies  (<250 copies / mL). A negative result must be combined with clinical  observations, patient history, and epidemiological information. If result is POSITIVE SARS-CoV-2 target nucleic acids are DETECTED. The SARS-CoV-2 RNA is generally detectable in upper and lower  respiratory specimens dur ing the acute phase of infection.  Positive  results are indicative of active infection with SARS-CoV-2.  Clinical  correlation with patient history and other diagnostic information is  necessary to determine patient infection status.  Positive results do  not rule out bacterial infection or co-infection with other viruses. If result is PRESUMPTIVE POSTIVE SARS-CoV-2 nucleic acids MAY BE PRESENT.   A presumptive positive result was obtained on the submitted specimen  and confirmed on repeat testing.  While 2019 novel coronavirus  (SARS-CoV-2) nucleic acids may be present in the submitted sample  additional confirmatory testing may be necessary for epidemiological  and / or clinical management purposes  to differentiate between  SARS-CoV-2 and other Sarbecovirus currently known to infect humans.  If clinically indicated additional testing with an alternate test  methodology [phone]) is advised. The SARS-CoV-2 RNA is generally  detectable in upper and lower respiratory sp ecimens during the acute  phase of infection. The expected result is Negative. Fact Sheet for Patients:  StrictlyIdeas.no Fact Sheet  for Healthcare Providers: BankingDealers.co.za This test is not yet approved or cleared by the [place] FDA and has been authorized for detection and/or diagnosis of SARS-CoV-2 by FDA under an Emergency Use Authorization (EUA).  This EUA will remain in effect (meaning this test can be used) for the duration of the COVID-19 declaration under Section 564(b)(1) of the Act, 21 U.S.C. section 360bbb-3(b)(1), unless the authorization is terminated or revoked sooner. Performed at [facility], [address] [place], [place] [address]   MRSA PCR Screening     Status: None   Collection Time: 08/26/18 11:36 AM   Specimen: Nasal Mucosa; Nasopharyngeal  Result Value Ref Range Status   MRSA  by PCR NEGATIVE NEGATIVE Final    Comment:        The GeneXpert MRSA Assay (FDA approved for NASAL specimens only), is one component of a comprehensive MRSA colonization surveillance program. It is not intended to diagnose MRSA infection nor to guide or monitor treatment for MRSA infections. Performed at [facility], [address] [place], [place] [id number]     RADIOLOGY:  No results found.  EKG:   Orders placed or performed during the hospital encounter of 08/21/18  . EKG 12-Lead  . EKG 12-Lead  . EKG 12-Lead  . EKG 12-Lead      Management plans discussed with the patient, family and they are in agreement.  CODE STATUS:     Code Status Orders  (From admission, onward)         Start     Ordered   08/26/18 1018  Full code  Continuous     08/26/18 1017        Code Status History    Date Active Date Inactive Code Status Order ID Comments User Context   08/29/2015 2209 09/02/2015 1753 Full Code [id number]  [place] SirenSainani, Vivek J, MD Inpatient   Advance Care Planning Activity      TOTAL TIME TAKING CARE OF THIS PATIENT: 40 minutes.    Evelena AsaMontell D Nolah Krenzer M.D on 09/03/2018 at 10:14 AM  Between 7am to 6pm - Pager - [phone]  After 6pm  go to [email or web address] - Social research officer, governmentpassword [email or web address]  [facility]  Office  [phone]  CC: Primary care physician; [facility]   Note: This dictation was prepared with Dragon dictation along with smaller phrase technology. Any transcriptional errors that result from this process are unintentional.

## 2018-09-03 NOTE — TOC Progression Note (Signed)
Transition of Care [facility]) - Progression Note    Patient Details  Name: Timothy [name] MRN: [id number] Date of Birth: [birth year]  Transition of Care [facility]) CM/SW Contact  Shelbie Hutching, RN Phone Number: 09/03/2018, 8:39 AM  Clinical Narrative:    Pasrr # [id number] F effective 6/22 ends 7/22.  Patient has been accepted at [facility] in [place].  Patient needs a current COVID and should be able to discharge today.    Expected Discharge Plan: Psychiatric Hospital Barriers to Discharge: Continued Medical Work up, Psych Bed not available  Expected Discharge Plan and Services Expected Discharge Plan: Psychiatric Hospital In-house Referral: Clinical Social Work Discharge Planning Services: CM Consult   Living arrangements for the past 2 months: Group Home                                       Social Determinants of Health (SDOH) Interventions    Readmission Risk Interventions No flowsheet data found.

## 2018-12-21 ENCOUNTER — Emergency Department
Admission: EM | Admit: 2018-12-21 | Discharge: 2018-12-21 | Disposition: A | Discharge status: Home / Self Care | Payer: Medicare Other | Attending: Student in an Organized Health Care Education/Training Program | Admitting: Student in an Organized Health Care Education/Training Program

## 2018-12-21 ENCOUNTER — Other Ambulatory Visit: Payer: Self-pay

## 2018-12-21 ENCOUNTER — Emergency Department: Payer: Medicare Other

## 2018-12-21 DIAGNOSIS — Z79899 Other long term (current) drug therapy: Secondary | ICD-10-CM | POA: Insufficient documentation

## 2018-12-21 DIAGNOSIS — R569 Unspecified convulsions: Secondary | ICD-10-CM | POA: Diagnosis present

## 2018-12-21 DIAGNOSIS — Z7982 Long term (current) use of aspirin: Secondary | ICD-10-CM | POA: Insufficient documentation

## 2018-12-21 DIAGNOSIS — F0390 Unspecified dementia without behavioral disturbance: Secondary | ICD-10-CM | POA: Insufficient documentation

## 2018-12-21 DIAGNOSIS — I10 Essential (primary) hypertension: Secondary | ICD-10-CM | POA: Diagnosis not present

## 2018-12-21 DIAGNOSIS — F17210 Nicotine dependence, cigarettes, uncomplicated: Secondary | ICD-10-CM | POA: Diagnosis not present

## 2018-12-21 LAB — CBC WITH DIFFERENTIAL/PLATELET
Abs Immature Granulocytes: 0.01 10*3/uL (ref 0.00–0.07)
Basophils Absolute: 0 10*3/uL (ref 0.0–0.1)
Basophils Relative: 1 %
Eosinophils Absolute: 0.1 10*3/uL (ref 0.0–0.5)
Eosinophils Relative: 3 %
HCT: 44.5 % (ref 39.0–52.0)
Hemoglobin: 14.6 g/dL (ref 13.0–17.0)
Immature Granulocytes: 0 %
Lymphocytes Relative: 17 %
Lymphs Abs: 0.8 10*3/uL (ref 0.7–4.0)
MCH: 27.5 pg (ref 26.0–34.0)
MCHC: 32.8 g/dL (ref 30.0–36.0)
MCV: 83.8 fL (ref 80.0–100.0)
Monocytes Absolute: 0.4 10*3/uL (ref 0.1–1.0)
Monocytes Relative: 9 %
Neutro Abs: 3.1 10*3/uL (ref 1.7–7.7)
Neutrophils Relative %: 70 %
Platelets: 190 10*3/uL (ref 150–400)
RBC: 5.31 MIL/uL (ref 4.22–5.81)
RDW: 14.1 % (ref 11.5–15.5)
WBC: 4.5 10*3/uL (ref 4.0–10.5)
nRBC: 0 % (ref 0.0–0.2)

## 2018-12-21 LAB — COMPREHENSIVE METABOLIC PANEL
ALT: 37 U/L (ref 0–44)
AST: 26 U/L (ref 15–41)
Albumin: 3.9 g/dL (ref 3.5–5.0)
Alkaline Phosphatase: 60 U/L (ref 38–126)
Anion gap: 9 (ref 5–15)
BUN: 13 mg/dL (ref 8–23)
CO2: 30 mmol/L (ref 22–32)
Calcium: 9.6 mg/dL (ref 8.9–10.3)
Chloride: 101 mmol/L (ref 98–111)
Creatinine, Ser: 0.88 mg/dL (ref 0.61–1.24)
GFR calc Af Amer: >60 mL/min (ref >60)
GFR calc non Af Amer: >60 mL/min (ref >60)
Glucose, Bld: 104 mg/dL — ABNORMAL HIGH (ref 70–99)
Potassium: 3.5 mmol/L (ref 3.5–5.1)
Sodium: 140 mmol/L (ref 135–145)
Total Bilirubin: 0.5 mg/dL (ref 0.3–1.2)
Total Protein: 7.4 g/dL (ref 6.5–8.1)

## 2018-12-21 MED ORDER — LAMOTRIGINE 25 MG PO TABS
150.0000 mg | ORAL_TABLET | Freq: Once | ORAL | Status: AC
  Administered 2018-12-21: 150 mg via ORAL
  Filled 2018-12-21: qty 1

## 2018-12-21 NOTE — ED Notes (Signed)
Able to contact owner of group home who is paying for taxi for pt

## 2018-12-21 NOTE — ED Provider Notes (Signed)
[facility] Emergency Department Provider Note    First MD Initiated Contact with Patient 12/21/18 1301     (approximate)  I have reviewed the triage vital signs and the nursing notes.   HISTORY  Chief Complaint Seizures    HPI Timothy [name] is a 85 y.o. male with a known history of seizure disorder on Lamictal followed by neurology as well as schizophrenia below listed past medical history presents for witnessed brief seizure-like activity reportedly generalized shaking followed by postictal period.  Denies any pain at this time.  Denies any chest pain or pressure.  Feels well.  States he has several seizures per year over the last one being about 2 months ago.  He states that he is been compliant with his medications but missed this mornings dose.     Past Medical History:  Diagnosis Date  . Hypertension   . Schizophrenia (HCC)   . Seizures (HCC)   . TIA (transient ischemic attack)    No family history on file. History reviewed. No pertinent surgical history. Patient Active Problem List   Diagnosis Date Noted  . Pneumonia 08/26/2018  . Pressure ulcer 08/30/2015  . Atrial fibrillation and flutter (HCC) 08/29/2015  . Cerebral vascular disease 06/09/2015  . B12 deficiency 06/09/2015  . Hypokalemia 06/09/2015  . 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 Seizures (HCC) 11/28/2014  . TIA (transient ischemic attack) 11/28/2014  . Schizophrenia (HCC) 11/28/2014  . Hypertension 11/28/2014  . Dyslipidemia 11/28/2014  . Dementia with behavioral disturbance (HCC) 11/28/2014      Prior to Admission medications   Medication Sig Start Date End Date Taking? Authorizing Provider  acetaminophen (TYLENOL) 325 MG tablet Take 2 tablets (650 mg total) by mouth 2 (two) times daily. As needed Patient taking differently: Take 650 mg by mouth 2 (two) times daily as needed. As needed 02/18/16   Duanne LimerickJones, Deanna C, MD  amLODipine (NORVASC) 10 MG tablet Take 1 tablet  (10 mg total) by mouth daily. 02/18/16   Duanne LimerickJones, Deanna C, MD  aspirin EC 81 MG tablet Take 1 tablet (81 mg total) by mouth daily. 09/03/18   Salary, Evelena AsaMontell D, MD  atorvastatin (LIPITOR) 10 MG tablet TAKE 1 TABLET BY MOUTH DAILY. Patient taking differently: Take 10 mg by mouth every evening.  08/31/16   Duanne LimerickJones, Deanna C, MD  lamoTRIgine (LAMICTAL) 150 MG tablet Take 150 mg by mouth 2 (two) times daily.    [provider]  memantine (NAMENDA) 5 MG tablet TAKE 1 TABLET BY MOUTH DAILY Patient taking differently: Take 5 mg by mouth daily.  08/31/16   Duanne LimerickJones, Deanna C, MD  Multiple Vitamin (MULTIVITAMIN WITH MINERALS) TABS tablet Take 1 tablet by mouth daily. 09/03/18   Salary, Evelena AsaMontell D, MD  nicotine (NICODERM CQ - DOSED IN MG/24 HOURS) 14 mg/24hr patch Place 1 patch (14 mg total) onto the skin daily. 09/03/18   Salary, Evelena AsaMontell D, MD  OLANZapine (ZYPREXA) 10 MG tablet Take 20 mg by mouth at bedtime.     [provider]  vitamin B-12 (CYANOCOBALAMIN) 1000 MCG tablet TAKE 1 TABLET BY MOUTH DAILY. Patient taking differently: Take 1,000 mcg by mouth daily.  08/31/16   Duanne LimerickJones, Deanna C, MD    Allergies Patient has no known allergies.    Social History Social History   Tobacco Use  . Smoking status: Current Every Day Smoker    Packs/day: 0.25    Years: 25.00    Pack years: 6.25    Types: Cigarettes  .  Smokeless tobacco: Never Used  Substance Use Topics  . Alcohol use: No    Alcohol/week: 0.0 standard drinks  . Drug use: No    Review of Systems Patient denies headaches, rhinorrhea, blurry vision, numbness, shortness of breath, chest pain, edema, cough, abdominal pain, nausea, vomiting, diarrhea, dysuria, fevers, rashes or hallucinations unless otherwise stated above in HPI. ____________________________________________   PHYSICAL EXAM:  VITAL SIGNS: Vitals:   12/21/18 1400 12/21/18 1430  BP: (!) 151/72 (!) 161/70  Pulse: 67 66  Resp: 18 20  Temp:    SpO2: 95% 95%     Constitutional: Alert and oriented.  Eyes: Conjunctivae are normal.  Head: Atraumatic. Nose: No congestion/rhinnorhea. Mouth/Throat: Mucous membranes are moist.   Neck: No stridor. Painless ROM.  Cardiovascular: Normal rate, regular rhythm. Grossly normal heart sounds.  Good peripheral circulation. Respiratory: Normal respiratory effort.  No retractions. Lungs CTAB. Gastrointestinal: Soft and nontender. No distention. No abdominal bruits. No CVA tenderness. Genitourinary:  Musculoskeletal: No lower extremity tenderness nor edema.  No joint effusions. Neurologic:  CN- intact.  No facial droop, Normal FNF.  Normal heel to shin.  Sensation intact bilaterally. Normal speech and language. No gross focal neurologic deficits are appreciated. No gait instability. Skin:  Skin is warm, dry and intact. No rash noted. Psychiatric: pleasant, calm and cooperative. Speech and behavior are normal.  ____________________________________________   LABS (all labs ordered are listed, but only abnormal results are displayed)  Results for orders placed or performed during the hospital encounter of 12/21/18 (from the past 24 hour(s))  CBC with Differential/Platelet     Status: None   Collection Time: 12/21/18  2:35 PM  Result Value Ref Range   WBC 4.5 4.0 - 10.5 K/uL   RBC 5.31 4.22 - 5.81 MIL/uL   Hemoglobin 14.6 13.0 - 17.0 g/dL   HCT 44.5 39.0 - 52.0 %   MCV 83.8 80.0 - 100.0 fL   MCH 27.5 26.0 - 34.0 pg   MCHC 32.8 30.0 - 36.0 g/dL   RDW 14.1 11.5 - 15.5 %   Platelets 190 150 - 400 K/uL   nRBC 0.0 0.0 - 0.2 %   Neutrophils Relative % 70 %   Neutro Abs 3.1 1.7 - 7.7 K/uL   Lymphocytes Relative 17 %   Lymphs Abs 0.8 0.7 - 4.0 K/uL   Monocytes Relative 9 %   Monocytes Absolute 0.4 0.1 - 1.0 K/uL   Eosinophils Relative 3 %   Eosinophils Absolute 0.1 0.0 - 0.5 K/uL   Basophils Relative 1 %   Basophils Absolute 0.0 0.0 - 0.1 K/uL   Immature Granulocytes 0 %   Abs Immature Granulocytes 0.01 0.00 -  0.07 K/uL  Comprehensive metabolic panel     Status: Abnormal   Collection Time: 12/21/18  2:35 PM  Result Value Ref Range   Sodium 140 135 - 145 mmol/L   Potassium 3.5 3.5 - 5.1 mmol/L   Chloride 101 98 - 111 mmol/L   CO2 30 22 - 32 mmol/L   Glucose, Bld 104 (H) 70 - 99 mg/dL   BUN 13 8 - 23 mg/dL   Creatinine, Ser 0.88 0.61 - 1.24 mg/dL   Calcium 9.6 8.9 - 10.3 mg/dL   Total Protein 7.4 6.5 - 8.1 g/dL   Albumin 3.9 3.5 - 5.0 g/dL   AST 26 15 - 41 U/L   ALT 37 0 - 44 U/L   Alkaline Phosphatase 60 38 - 126 U/L   Total Bilirubin 0.5 0.3 -  1.2 mg/dL   GFR calc non Af Amer >60 >60 mL/min   GFR calc Af Amer >60 >60 mL/min   Anion gap 9 5 - 15   ____________________________________________  EKG My review and personal interpretation at Time: 13:14   Indication: seizure like activity  Rate: 70  Rhythm: sinus Axis: left Other: normal intervals, no stemi, lvh criteria,  ____________________________________________  RADIOLOGY  Mix one tablespoon with 8oz of your favorite juice or water every day until you are having soft formed stools. Then start taking once daily if you didn't have a stool the day before.  ____________________________________________   PROCEDURES  Procedure(s) performed:  Procedures    Critical Care performed: no ____________________________________________   INITIAL IMPRESSION / ASSESSMENT AND PLAN / ED COURSE  Pertinent labs & imaging results that were available during my care of the patient were reviewed by me and considered in my medical decision making (see chart for details).   DDX: seizure, dysrhythmia, electrolyte abn, withdrawal, noncompliance, sdh, sah, mass  Timothy [name] is a 85 y.o. who presents to the ED with known seizure disorder presents the ER after witnessed seizure activity today.  Feels well at this time.  Given his age and risk factors I will order blood work as well as CT imaging of the head to evaluate for the but differential.   Otherwise he looks clinically well and is asymptomatic at this time.  The patient will be placed on continuous pulse oximetry and telemetry for monitoring.  Laboratory evaluation will be sent to evaluate for the above complaints.     Clinical Course as of Dec 20 1541  Fri Dec 21, 2018  1434 Fortunately CT head shows no acute abnormalities.  Patient remains well-appearing and asymptomatic.  No additional seizure-like activity.   [PR]    Clinical Course User Index [PR] Willy Eddy, MD   Patient was able to tolerate PO and was able to ambulate with a steady gait.  Has been observed in the ER without any additional seizure-like activity.  Patient redosed on his Lamictal.  Stable appropriate for outpatient follow-up.  The patient was evaluated in Emergency Department today for the symptoms described in the history of present illness. He/she was evaluated in the context of the global COVID-19 pandemic, which necessitated consideration that the patient might be at risk for infection with the SARS-CoV-2 virus that causes COVID-19. Institutional protocols and algorithms that pertain to the evaluation of patients at risk for COVID-19 are in a state of rapid change based on information released by regulatory bodies including the CDC and federal and state organizations. These policies and algorithms were followed during the patient's care in the ED.  As part of my medical decision making, I reviewed the following data within the electronic MEDICAL RECORD NUMBER Nursing notes reviewed and incorporated, Labs reviewed, notes from prior ED visits and [place] Controlled Substance Database   ____________________________________________   FINAL CLINICAL IMPRESSION(S) / ED DIAGNOSES  Final diagnoses:  Seizure (HCC)      NEW MEDICATIONS STARTED DURING THIS VISIT:  New Prescriptions   No medications on file     Note:  This document was prepared using Dragon voice recognition software and may include  unintentional dictation errors.    Willy Eddy, MD 12/21/18 [phone]

## 2018-12-21 NOTE — ED Notes (Addendum)
Attempted to reach legal guardian as well as other contact identified in pt's chartwith no success

## 2018-12-21 NOTE — ED Notes (Signed)
Recollect green and lav sent to lab. MD informed

## 2018-12-21 NOTE — ED Notes (Signed)
Patient transported to CT 

## 2018-12-21 NOTE — ED Notes (Signed)
lav and green top redrawn and sent to lab

## 2018-12-21 NOTE — ED Notes (Signed)
PT to [place] to await taxi  First nurse aware

## 2018-12-21 NOTE — ED Notes (Signed)
Attempted to call both numbers listed for legal guardian and unable to get or leave a message. Will continue to keep trying

## 2018-12-21 NOTE — ED Notes (Signed)
Pt given meal tray and OJ at this time. Pt sitting up and eating. Pt given warm blanket

## 2018-12-21 NOTE — ED Triage Notes (Signed)
Pt comes via [facility] from [place] located at [address] with c/o witnessed seizure.  Pt has hx of seizures and last seizure was 2 months ago.  EMS reports when they arrived pt was in postictal state and not able to answer questions at first. Pt arrives alert and answering questions appropriately at this time. Pt denies any pain.  EMS reports VSS, [id number]

## 2018-12-24 LAB — LAMOTRIGINE LEVEL: Lamotrigine Lvl: 3.9 ug/mL (ref 2.0–20.0)

## 2019-12-25 ENCOUNTER — Other Ambulatory Visit: Payer: Self-pay

## 2019-12-25 ENCOUNTER — Emergency Department
Admission: EM | Admit: 2019-12-25 | Discharge: 2019-12-25 | Disposition: A | Discharge status: Home / Self Care | Payer: Medicare Other | Attending: Emergency Medicine | Admitting: Emergency Medicine

## 2019-12-25 DIAGNOSIS — F0391 Unspecified dementia with behavioral disturbance: Secondary | ICD-10-CM | POA: Diagnosis not present

## 2019-12-25 DIAGNOSIS — I10 Essential (primary) hypertension: Secondary | ICD-10-CM | POA: Insufficient documentation

## 2019-12-25 DIAGNOSIS — Z79899 Other long term (current) drug therapy: Secondary | ICD-10-CM | POA: Insufficient documentation

## 2019-12-25 DIAGNOSIS — R569 Unspecified convulsions: Secondary | ICD-10-CM

## 2019-12-25 DIAGNOSIS — Z7982 Long term (current) use of aspirin: Secondary | ICD-10-CM | POA: Insufficient documentation

## 2019-12-25 DIAGNOSIS — F17210 Nicotine dependence, cigarettes, uncomplicated: Secondary | ICD-10-CM | POA: Diagnosis not present

## 2019-12-25 LAB — BASIC METABOLIC PANEL
Anion gap: 8 (ref 5–15)
BUN: 18 mg/dL (ref 8–23)
CO2: 30 mmol/L (ref 22–32)
Calcium: 9.2 mg/dL (ref 8.9–10.3)
Chloride: 102 mmol/L (ref 98–111)
Creatinine, Ser: 0.98 mg/dL (ref 0.61–1.24)
GFR, Estimated: >60 mL/min (ref >60)
Glucose, Bld: 138 mg/dL — ABNORMAL HIGH (ref 70–99)
Potassium: 3.6 mmol/L (ref 3.5–5.1)
Sodium: 140 mmol/L (ref 135–145)

## 2019-12-25 LAB — CBC WITH DIFFERENTIAL/PLATELET
Abs Immature Granulocytes: 0.01 10*3/uL (ref 0.00–0.07)
Basophils Absolute: 0 10*3/uL (ref 0.0–0.1)
Basophils Relative: 1 %
Eosinophils Absolute: 0.1 10*3/uL (ref 0.0–0.5)
Eosinophils Relative: 2 %
HCT: 42 % (ref 39.0–52.0)
Hemoglobin: 13.8 g/dL (ref 13.0–17.0)
Immature Granulocytes: 0 %
Lymphocytes Relative: 35 %
Lymphs Abs: 1.3 10*3/uL (ref 0.7–4.0)
MCH: 27.8 pg (ref 26.0–34.0)
MCHC: 32.9 g/dL (ref 30.0–36.0)
MCV: 84.7 fL (ref 80.0–100.0)
Monocytes Absolute: 0.5 10*3/uL (ref 0.1–1.0)
Monocytes Relative: 13 %
Neutro Abs: 1.8 10*3/uL (ref 1.7–7.7)
Neutrophils Relative %: 49 %
Platelets: 152 10*3/uL (ref 150–400)
RBC: 4.96 MIL/uL (ref 4.22–5.81)
RDW: 14.5 % (ref 11.5–15.5)
WBC: 3.7 10*3/uL — ABNORMAL LOW (ref 4.0–10.5)
nRBC: 0 % (ref 0.0–0.2)

## 2019-12-25 NOTE — ED Provider Notes (Signed)
[facility] Emergency Department Provider Note  ____________________________________________  Time seen: Approximately 8:20 AM  I have reviewed the triage vital signs and the nursing notes.   HISTORY  Chief Complaint Seizures and Fall    Level 5 Caveat: Portions of the History and Physical including HPI and review of systems are unable to be completely obtained due to patient being a poor historian   HPI Timothy [name] is a 86 y.o. male with a history of hypertension schizophrenia and seizures who was sent to the ED from his group home due to a fall this morning.  Another group home resident noted the patient to be having an apparent seizure and helped lower him safely to the ground.  No reports of head injury or other serious injury.  The patient is now alert and states that he feels fine, denies headache chest pain abdominal pain or musculoskeletal complaint.  Denies recent illness.  Reports he has been eating and drinking normally and taking his medication.      Past Medical History:  Diagnosis Date  . Hypertension   . Schizophrenia (HCC)   . Seizures (HCC)   . TIA (transient ischemic attack)      Patient Active Problem List   Diagnosis Date Noted  . Pneumonia 08/26/2018  . Pressure ulcer 08/30/2015  . Atrial fibrillation and flutter (HCC) 08/29/2015  . Cerebral vascular disease 06/09/2015  . B12 deficiency 06/09/2015  . Hypokalemia 06/09/2015  . 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 Seizures (HCC) 11/28/2014  . TIA (transient ischemic attack) 11/28/2014  . Schizophrenia (HCC) 11/28/2014  . Hypertension 11/28/2014  . Dyslipidemia 11/28/2014  . Dementia with behavioral disturbance (HCC) 11/28/2014     No past surgical history on file.   Prior to Admission medications   Medication Sig Start Date End Date Taking? Authorizing Provider  acetaminophen (TYLENOL) 325 MG tablet Take 2 tablets (650 mg total) by mouth 2 (two) times daily. As  needed Patient taking differently: Take 650 mg by mouth 2 (two) times daily as needed. As needed 02/18/16   Duanne Limerick, MD  amLODipine (NORVASC) 10 MG tablet Take 1 tablet (10 mg total) by mouth daily. 02/18/16   Duanne Limerick, MD  aspirin EC 81 MG tablet Take 1 tablet (81 mg total) by mouth daily. 09/03/18   Salary, Evelena Asa, MD  atorvastatin (LIPITOR) 10 MG tablet TAKE 1 TABLET BY MOUTH DAILY. Patient taking differently: Take 10 mg by mouth every evening.  08/31/16   Duanne Limerick, MD  lamoTRIgine (LAMICTAL) 150 MG tablet Take 150 mg by mouth 2 (two) times daily.    [provider]  memantine (NAMENDA) 5 MG tablet TAKE 1 TABLET BY MOUTH DAILY Patient taking differently: Take 5 mg by mouth daily.  08/31/16   Duanne Limerick, MD  Multiple Vitamin (MULTIVITAMIN WITH MINERALS) TABS tablet Take 1 tablet by mouth daily. 09/03/18   Salary, Evelena Asa, MD  nicotine (NICODERM CQ - DOSED IN MG/24 HOURS) 14 mg/24hr patch Place 1 patch (14 mg total) onto the skin daily. 09/03/18   Salary, Evelena Asa, MD  OLANZapine (ZYPREXA) 10 MG tablet Take 20 mg by mouth at bedtime.     [provider]  vitamin B-12 (CYANOCOBALAMIN) 1000 MCG tablet TAKE 1 TABLET BY MOUTH DAILY. Patient taking differently: Take 1,000 mcg by mouth daily.  08/31/16   Duanne Limerick, MD     Allergies Patient has no known allergies.   No family history on file.  Social History Social History   Tobacco Use  . Smoking status: Current Every Day Smoker    Packs/day: 0.25    Years: 25.00    Pack years: 6.25    Types: Cigarettes  . Smokeless tobacco: Never Used  Substance Use Topics  . Alcohol use: No    Alcohol/week: 0.0 standard drinks  . Drug use: No    Review of Systems Level 5 Caveat: Portions of the History and Physical including HPI and review of systems are unable to be completely obtained due to patient being a poor historian   Constitutional:   No known fever.  ENT:   No  rhinorrhea. Cardiovascular:   No chest pain or syncope. Respiratory:   No dyspnea or cough. Gastrointestinal:   Negative for abdominal pain, vomiting and diarrhea.  Musculoskeletal:   Negative for focal pain or swelling ____________________________________________   PHYSICAL EXAM:  VITAL SIGNS: ED Triage Vitals  Enc Vitals Group     BP 12/25/19 0755 (!) 148/89     Pulse Rate 12/25/19 0755 91     Resp 12/25/19 0755 19     Temp 12/25/19 0756 97.8 F (36.6 C)     Temp Source 12/25/19 0756 Oral     SpO2 12/25/19 0755 99 %     Weight 12/25/19 0753 170 lb (77.1 kg)     Height 12/25/19 0753 5\' 11"  (1.803 m)     Head Circumference --      Peak Flow --      Pain Score 12/25/19 0753 0     Pain Loc --      Pain Edu? --      Excl. in GC? --     Vital signs reviewed, nursing assessments reviewed.   Constitutional:   Alert and oriented. Non-toxic appearance. Eyes:   Conjunctivae are normal. EOMI. PERRL. ENT      Head:   Normocephalic and atraumatic.      Nose:   No congestion/rhinnorhea.       Mouth/Throat:   MMM, no pharyngeal erythema. No peritonsillar mass.       Neck:   No meningismus. Full ROM. Hematological/Lymphatic/Immunilogical:   No cervical lymphadenopathy. Cardiovascular:   RRR. Symmetric bilateral radial and DP pulses.  No murmurs. Cap refill less than 2 seconds. Respiratory:   Normal respiratory effort without tachypnea/retractions. Breath sounds are clear and equal bilaterally. No wheezes/rales/rhonchi. Gastrointestinal:   Soft and nontender. Non distended. There is no CVA tenderness.  No rebound, rigidity, or guarding. Musculoskeletal:   Normal range of motion in all extremities. No joint effusions.  No lower extremity tenderness.  No edema. Neurologic:   Normal speech and language.  Motor grossly intact. No acute focal neurologic deficits are appreciated.  Skin:    Skin is warm, dry and intact. No rash noted.  No petechiae, purpura, or  bullae.  ____________________________________________    LABS (pertinent positives/negatives) (all labs ordered are listed, but only abnormal results are displayed) Labs Reviewed  BASIC METABOLIC PANEL - Abnormal; Notable for the following components:      Result Value   Glucose, Bld 138 (*)    All other components within normal limits  CBC WITH DIFFERENTIAL/PLATELET - Abnormal; Notable for the following components:   WBC 3.7 (*)    All other components within normal limits  LAMOTRIGINE LEVEL  CBG MONITORING, ED   ____________________________________________   EKG    ____________________________________________    RADIOLOGY  No results found.  ____________________________________________   PROCEDURES Procedures  ____________________________________________  DIFFERENTIAL DIAGNOSIS   Dehydration, electrolyte abnormality, medication noncompliance, subtherapeutic antiepileptic medication  CLINICAL IMPRESSION / ASSESSMENT AND PLAN / ED COURSE  Medications ordered in the ED: Medications - No data to display  Pertinent labs & imaging results that were available during my care of the patient were reviewed by me and considered in my medical decision making (see chart for details).   Arling Cerone was evaluated in Emergency Department on 12/25/2019 for the symptoms described in the history of present illness. He was evaluated in the context of the global COVID-19 pandemic, which necessitated consideration that the patient might be at risk for infection with the SARS-CoV-2 virus that causes COVID-19. Institutional protocols and algorithms that pertain to the evaluation of patients at risk for COVID-19 are in a state of rapid change based on information released by regulatory bodies including the CDC and federal and state organizations. These policies and algorithms were followed during the patient's care in the ED.   Patient sent to ED after a witnessed seizure in the  setting of known seizure disorder.  Patient denies any acute complaints and is back to baseline now.  Will check labs and plan for discharge home if unremarkable.  ----------------------------------------- 9:35 AM on 12/25/2019 -----------------------------------------  Labs unremarkable.  Lamotrigine level pending which can be followed up by outpatient providers.  Stable for discharge back to group home.      ____________________________________________   FINAL CLINICAL IMPRESSION(S) / ED DIAGNOSES    Final diagnoses:  Seizure [facility])     ED Discharge Orders    None      Portions of this note were generated with dragon dictation software. Dictation errors may occur despite best attempts at proofreading.   Sharman Cheek, MD 12/25/19 [phone]

## 2019-12-25 NOTE — ED Triage Notes (Signed)
[address]group home  Pt here via [facility] from group with a fall from posible seizure. Pt has hx of seizures. A&Ox1 at the moment, normally A&Ox4. cbg 127.Pt NAD on arrival.

## 2019-12-25 NOTE — TOC Progression Note (Signed)
Transition of Care [facility]) - Progression Note    Patient Details  Name: Timothy [name] MRN: [id number] Date of Birth: Dec 04, 1932  [facility] CM/SW Contact  Marina Goodell Phone Number: [phone] 12/25/2019, 11:07 AM  Clinical Narrative:     Patient will transport via [facility] back home.  Guardian has been notified.        Expected Discharge Plan and Services                                                 Social Determinants of Health (SDOH) Interventions    Readmission Risk Interventions No flowsheet data found.

## 2019-12-25 NOTE — Discharge Instructions (Addendum)
Continue taking all of your medications as prescribed and follow your usual diet.  Please follow-up with neurology for further assessment of your seizure medication.

## 2019-12-26 LAB — LAMOTRIGINE LEVEL: Lamotrigine Lvl: 4.8 ug/mL (ref 2.0–20.0)

## 2020-01-19 IMAGING — CR DG CHEST 1V
1 series · 1 of 1 positions shown · non-contrast
Comparison: 08/31/2015

CLINICAL DATA: Altered mental status

EXAM:
CHEST  1 VIEW

[chest ap]
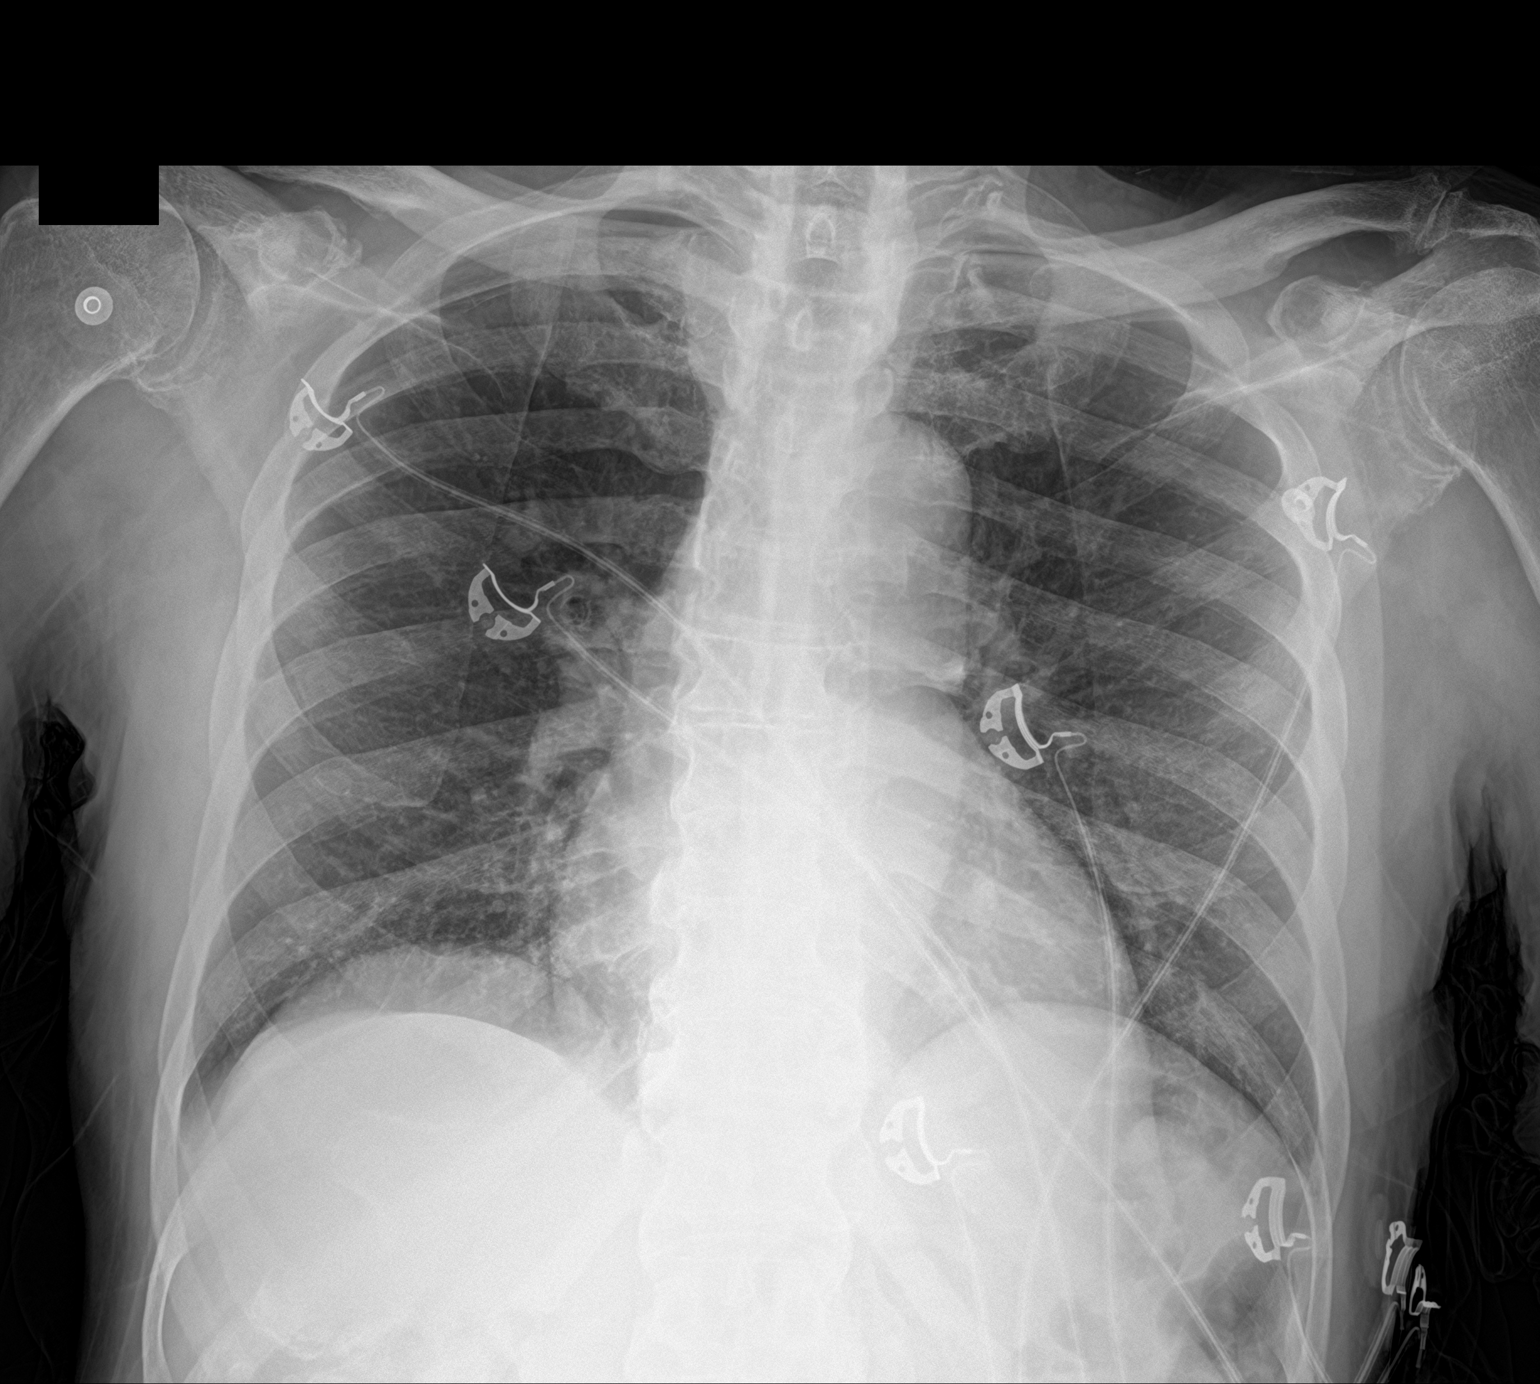

[1 of 1 positions shown; findings below may reference images not displayed]

FINDINGS: Mild cardiac enlargement but normal vascularity. Similar eventration
right hemidiaphragm. No focal pneumonia, collapse or consolidation.
Negative for edema, effusion or pneumothorax. Trachea is midline.
Aorta atherosclerotic. Degenerative changes of the spine and
shoulders.
IMPRESSION: Mild cardiomegaly without acute process

## 2020-03-14 DEATH — deceased

## 2020-07-07 IMAGING — CT CT HEAD WITHOUT CONTRAST
3 series · 15 of 47 positions shown, 18 images · non-contrast
Comparison: CT scan of March 04, 2018.

CLINICAL DATA: Confusion.

EXAM:
CT HEAD WITHOUT CONTRAST
TECHNIQUE: Contiguous axial images were obtained from the base of the skull
through the vertex without intravenous contrast.

[Series 3: head wo · axial · 0.47mm/px · z∈[-155,-30]mm · 9 of 31 slices shown, 12 images]
[im 3/31  brain]
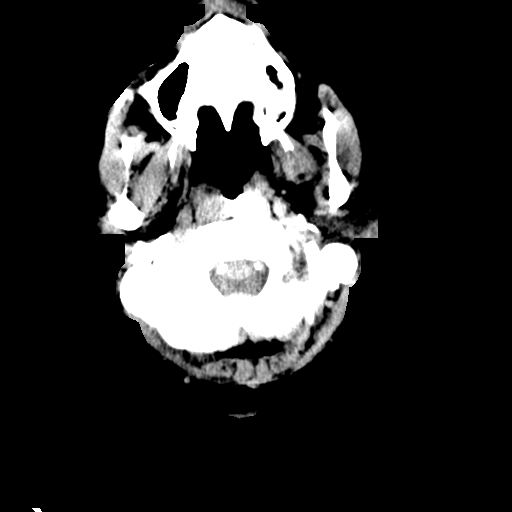
[im 3/31  bone]
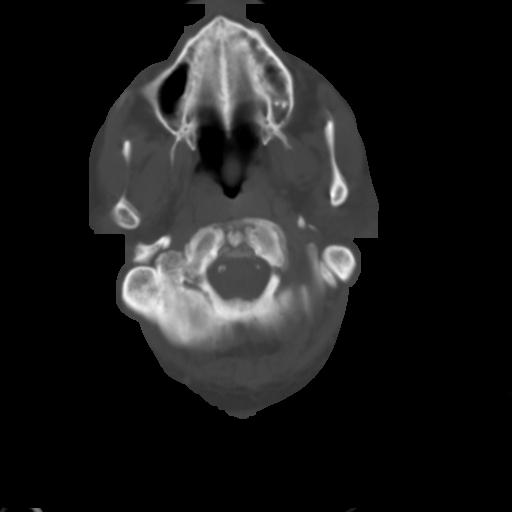
[im 6/31  brain]
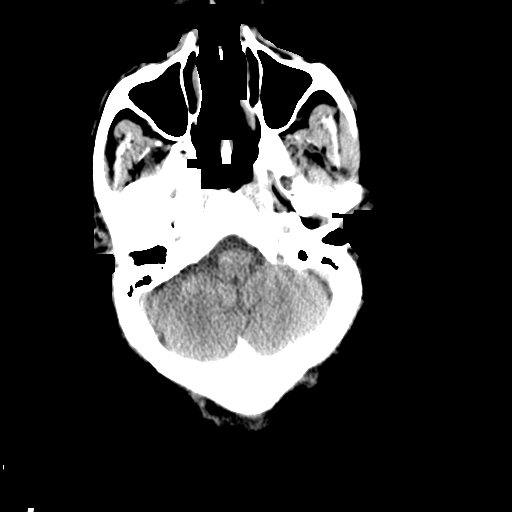
[im 9/31  brain]
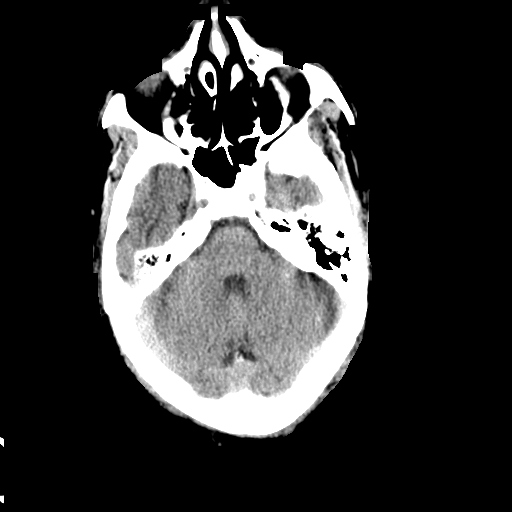
[im 12/31  brain]
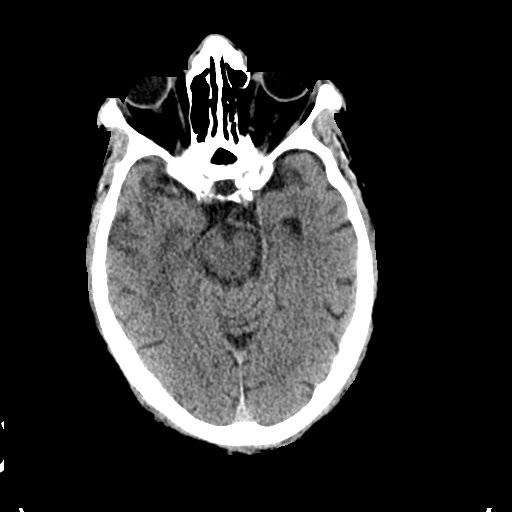
[im 16/31  brain]
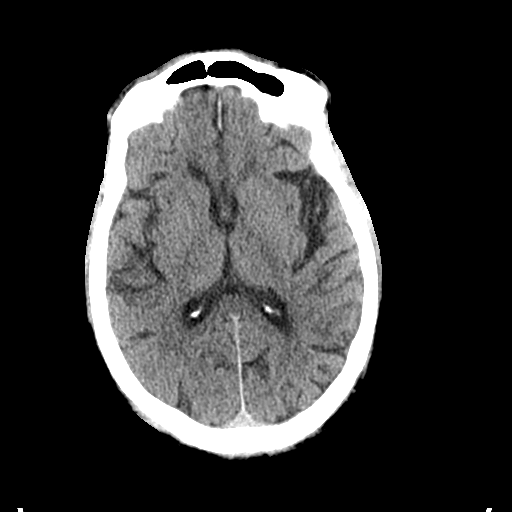
[im 16/31  bone]
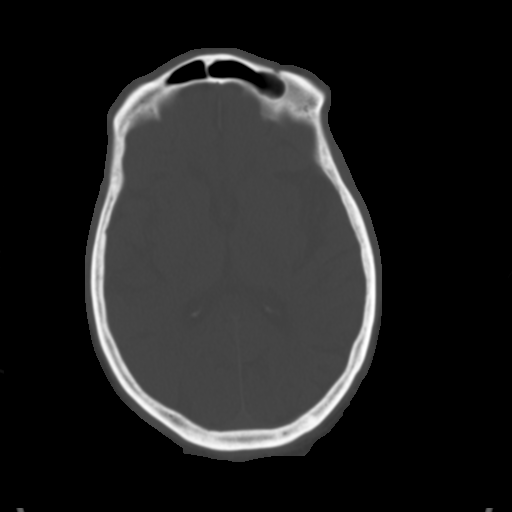
[im 19/31  brain]
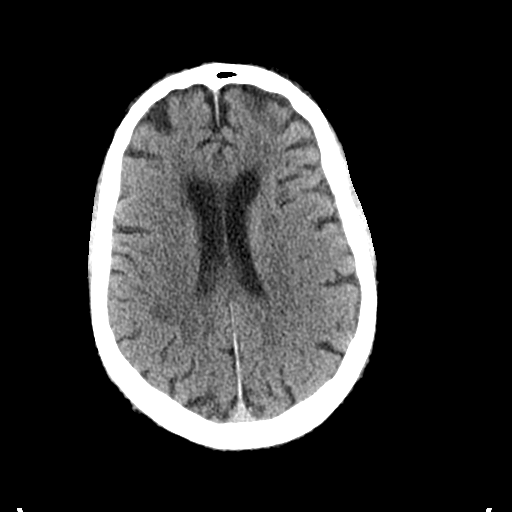
[im 22/31  brain]
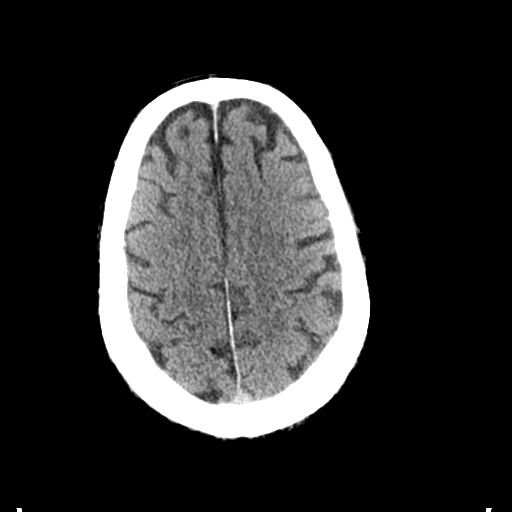
[im 25/31  brain]
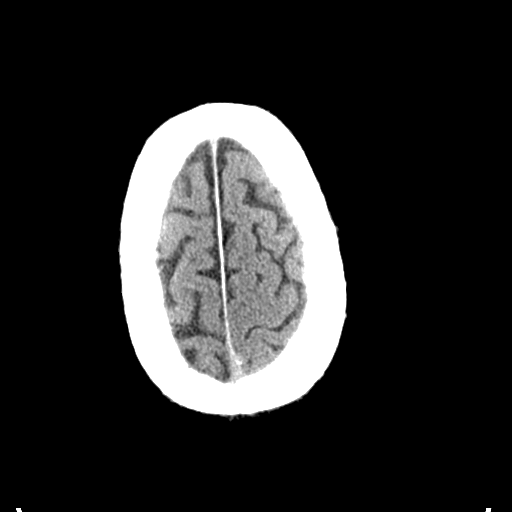
[im 28/31  brain]
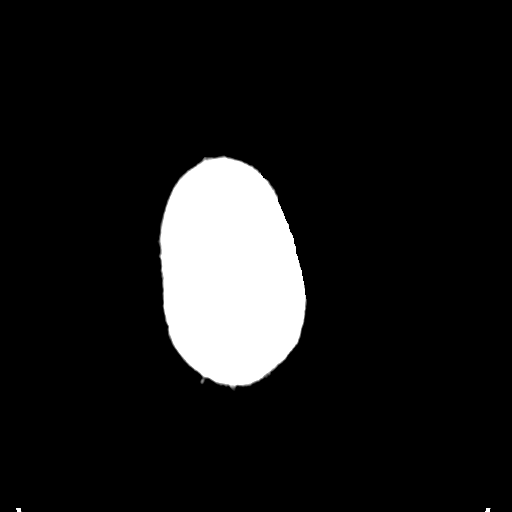
[im 28/31  bone]
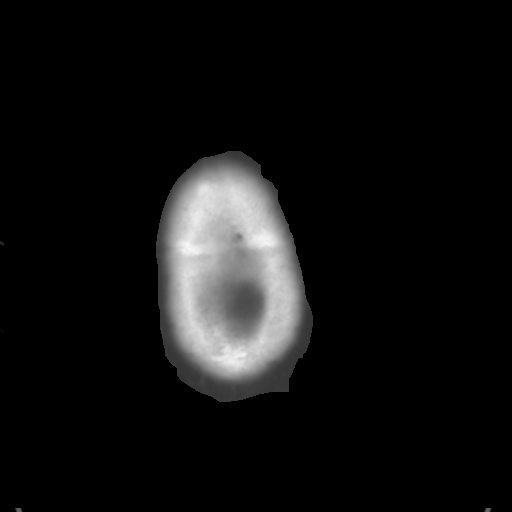

[Series 4: coronal soft tissue · coronal · 0.30mm/px · 3 of 67 slices shown]
[im 23/67  brain]
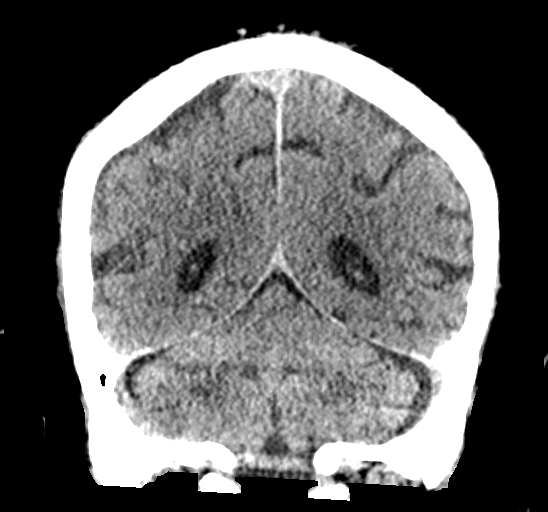
[im 30/67  brain]
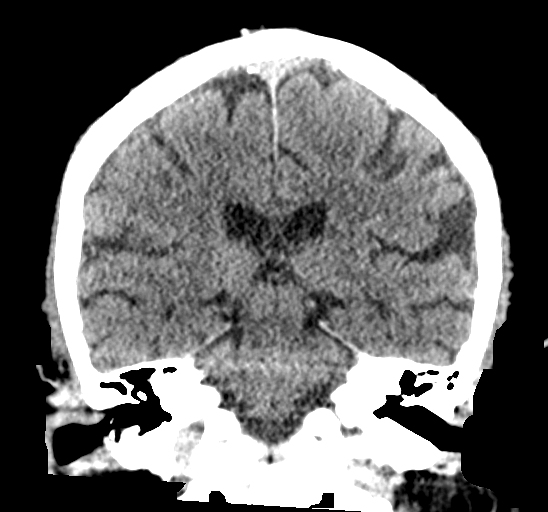
[im 37/67  brain]
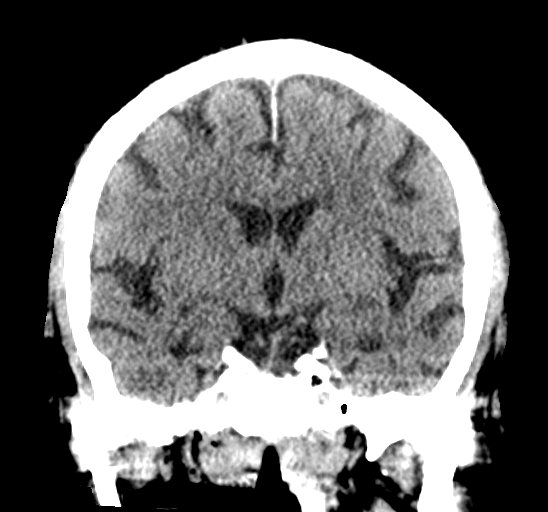

[Series 5: sagittal soft tissue · sagittal · 0.29mm/px · 3 of 56 slices shown]
[im 19/56  brain]
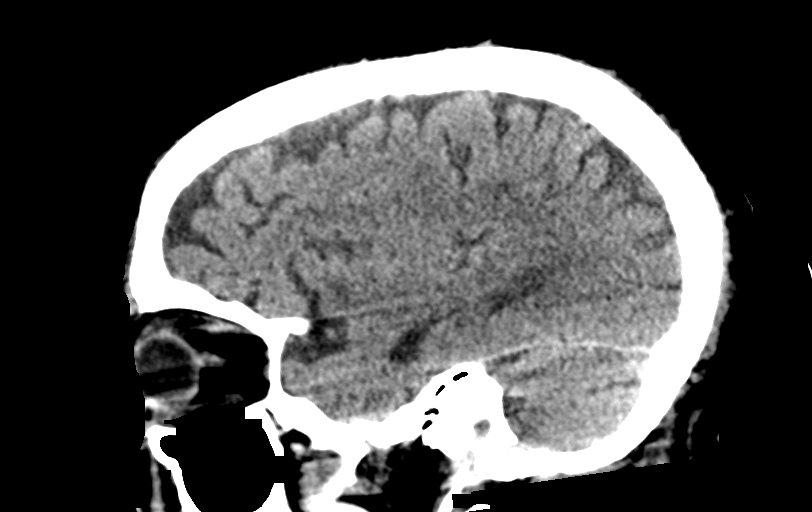
[im 28/56  brain]
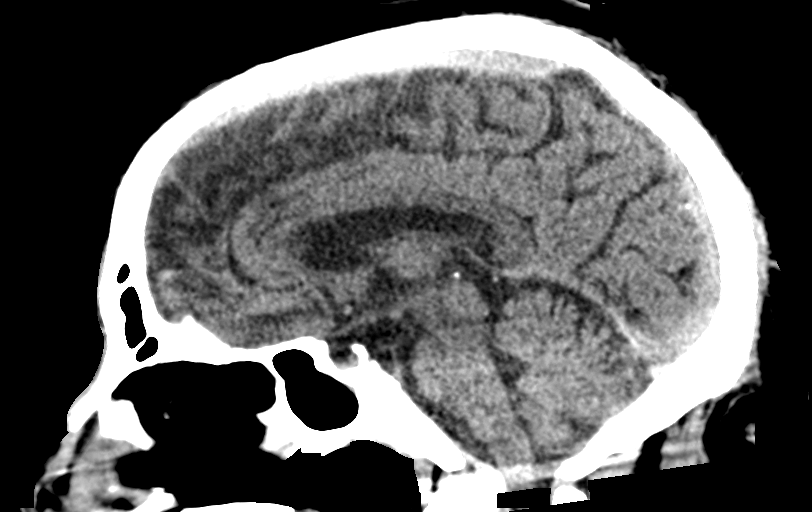
[im 37/56  brain]
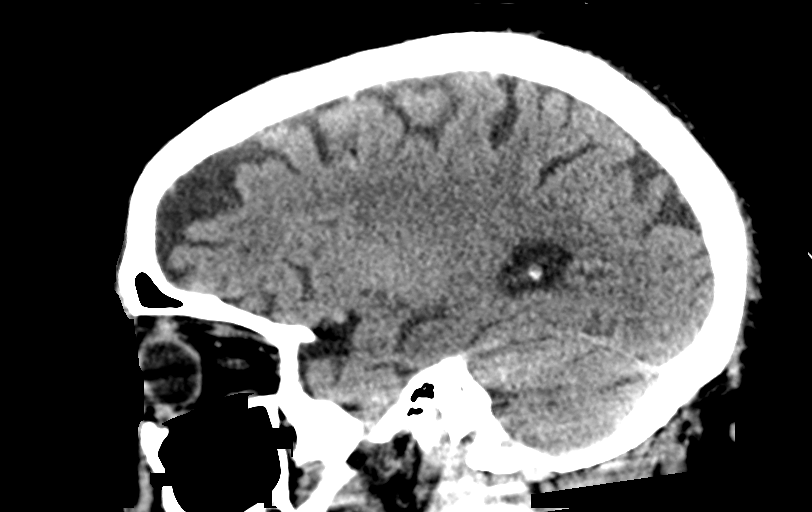

[15 of 47 positions shown; findings below may reference images not displayed]

FINDINGS: Brain: Minimal diffuse cortical atrophy is noted. Mild chronic
ischemic white matter disease is noted. No mass effect or midline
shift is noted. Ventricular size is within normal limits. There is
no evidence of mass lesion, hemorrhage or acute infarction.

Vascular: No hyperdense vessel or unexpected calcification.

Skull: Normal. Negative for fracture or focal lesion.

Sinuses/Orbits: No acute finding.

Other: None.
IMPRESSION: Minimal diffuse cortical atrophy. Mild chronic ischemic white matter
disease. No acute intracranial abnormality seen.

## 2020-07-08 IMAGING — DX PORTABLE CHEST - 1 VIEW
1 series · 1 of 1 positions shown · non-contrast
Comparison: Single-view of the chest 03/04/2018. PA and lateral
chest 08/31/2015.

CLINICAL DATA: Cigarette smoker.

EXAM:
PORTABLE CHEST 1 VIEW

[chest ap]
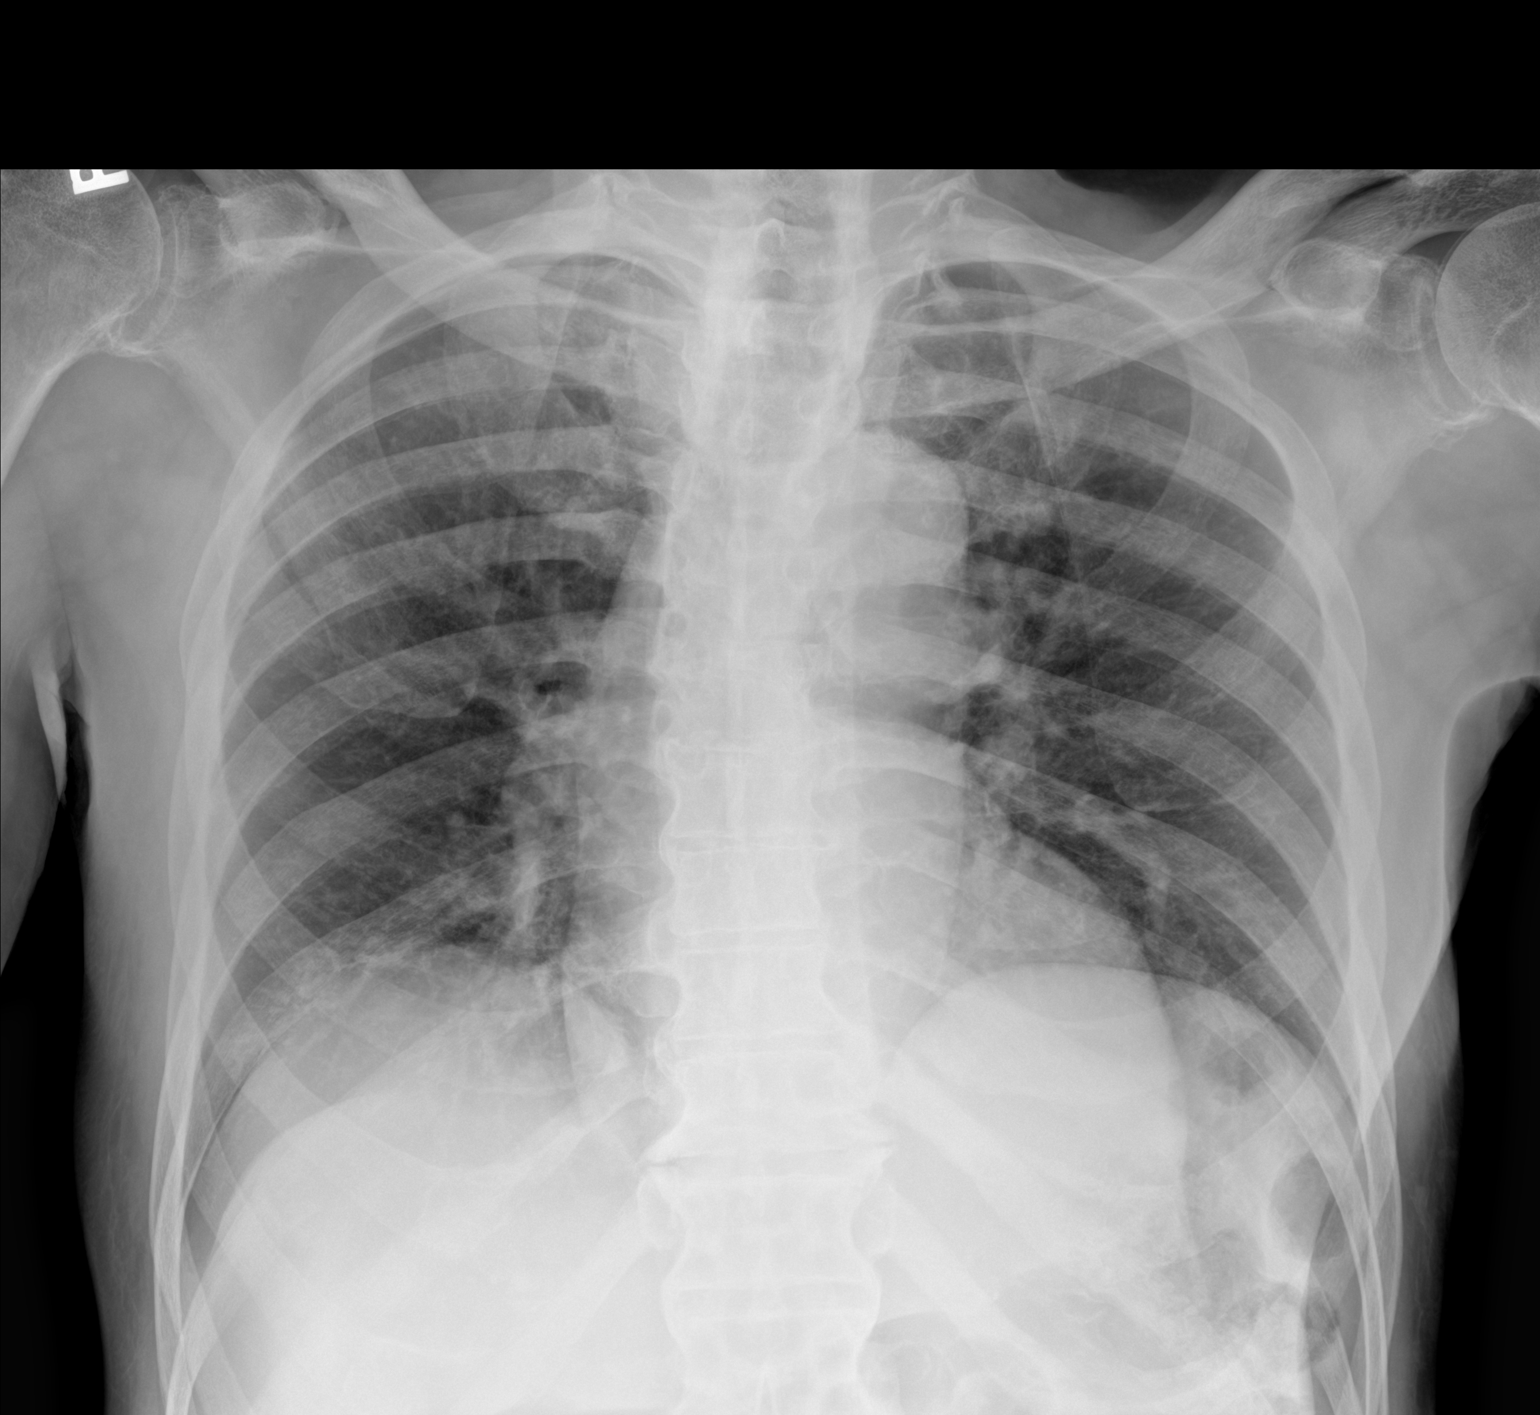

[1 of 1 positions shown; findings below may reference images not displayed]

FINDINGS: Lung volumes are low. Lungs are clear. Heart size is normal. No
pneumothorax or pleural fluid. No acute or focal bony abnormality.
IMPRESSION: No acute disease.
